# Patient Record
Sex: Male | Born: 1945 | Hispanic: No | Smoking: Never smoker
Health system: Southern US, Community
[De-identification: ages and names within clinical notes are randomized; demographics above are authoritative.]

## PROBLEM LIST (undated history)

## (undated) DIAGNOSIS — I10 Essential (primary) hypertension: Secondary | ICD-10-CM

## (undated) DIAGNOSIS — R531 Weakness: Secondary | ICD-10-CM

## (undated) DIAGNOSIS — A809 Acute poliomyelitis, unspecified: Secondary | ICD-10-CM

## (undated) DIAGNOSIS — Z8546 Personal history of malignant neoplasm of prostate: Secondary | ICD-10-CM

## (undated) DIAGNOSIS — M1990 Unspecified osteoarthritis, unspecified site: Secondary | ICD-10-CM

## (undated) DIAGNOSIS — N492 Inflammatory disorders of scrotum: Secondary | ICD-10-CM

## (undated) DIAGNOSIS — K219 Gastro-esophageal reflux disease without esophagitis: Secondary | ICD-10-CM

## (undated) HISTORY — PX: OTHER SURGICAL HISTORY: SHX169

---
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
## 1997-11-15 ENCOUNTER — Other Ambulatory Visit: Admission: RE | Admit: 1997-11-15 | Discharge: 1997-11-15 | Payer: Self-pay

## 1998-01-05 ENCOUNTER — Encounter: Admission: RE | Admit: 1998-01-05 | Discharge: 1998-01-05 | Payer: Self-pay | Admitting: Internal Medicine

## 1998-02-09 ENCOUNTER — Encounter: Admission: RE | Admit: 1998-02-09 | Discharge: 1998-02-09 | Payer: Self-pay | Admitting: Internal Medicine

## 1998-03-07 ENCOUNTER — Encounter: Admission: RE | Admit: 1998-03-07 | Discharge: 1998-03-07 | Payer: Self-pay | Admitting: Hematology and Oncology

## 1998-03-12 ENCOUNTER — Ambulatory Visit (HOSPITAL_COMMUNITY): Admission: RE | Admit: 1998-03-12 | Discharge: 1998-03-12 | Payer: Self-pay | Admitting: Gastroenterology

## 1998-03-30 ENCOUNTER — Encounter: Admission: RE | Admit: 1998-03-30 | Discharge: 1998-03-30 | Payer: Self-pay | Admitting: Internal Medicine

## 1998-05-03 ENCOUNTER — Encounter: Admission: RE | Admit: 1998-05-03 | Discharge: 1998-05-03 | Payer: Self-pay | Admitting: Internal Medicine

## 1998-06-12 ENCOUNTER — Encounter: Admission: RE | Admit: 1998-06-12 | Discharge: 1998-06-12 | Payer: Self-pay | Admitting: Internal Medicine

## 1998-06-29 ENCOUNTER — Encounter: Admission: RE | Admit: 1998-06-29 | Discharge: 1998-06-29 | Payer: Self-pay | Admitting: Internal Medicine

## 1998-10-12 ENCOUNTER — Encounter: Admission: RE | Admit: 1998-10-12 | Discharge: 1998-10-12 | Payer: Self-pay | Admitting: Internal Medicine

## 1999-01-02 ENCOUNTER — Encounter: Admission: RE | Admit: 1999-01-02 | Discharge: 1999-01-02 | Payer: Self-pay | Admitting: Internal Medicine

## 1999-03-08 ENCOUNTER — Encounter: Admission: RE | Admit: 1999-03-08 | Discharge: 1999-03-08 | Payer: Self-pay | Admitting: Internal Medicine

## 1999-06-06 ENCOUNTER — Encounter: Admission: RE | Admit: 1999-06-06 | Discharge: 1999-06-06 | Payer: Self-pay | Admitting: Internal Medicine

## 1999-06-14 ENCOUNTER — Emergency Department (HOSPITAL_COMMUNITY): Admission: EM | Admit: 1999-06-14 | Discharge: 1999-06-14 | Payer: Self-pay | Admitting: Emergency Medicine

## 1999-06-28 ENCOUNTER — Encounter: Admission: RE | Admit: 1999-06-28 | Discharge: 1999-06-28 | Payer: Self-pay | Admitting: Internal Medicine

## 2001-08-28 ENCOUNTER — Emergency Department (HOSPITAL_COMMUNITY): Admission: EM | Admit: 2001-08-28 | Discharge: 2001-08-28 | Payer: Self-pay | Admitting: Emergency Medicine

## 2002-01-15 ENCOUNTER — Emergency Department (HOSPITAL_COMMUNITY): Admission: EM | Admit: 2002-01-15 | Discharge: 2002-01-15 | Payer: Self-pay | Admitting: Emergency Medicine

## 2002-04-19 ENCOUNTER — Ambulatory Visit (HOSPITAL_COMMUNITY): Admission: RE | Admit: 2002-04-19 | Discharge: 2002-04-19 | Payer: Self-pay | Admitting: Gastroenterology

## 2002-05-17 ENCOUNTER — Emergency Department (HOSPITAL_COMMUNITY): Admission: EM | Admit: 2002-05-17 | Discharge: 2002-05-17 | Payer: Self-pay | Admitting: Emergency Medicine

## 2002-05-17 ENCOUNTER — Encounter: Payer: Self-pay | Admitting: Emergency Medicine

## 2004-03-29 ENCOUNTER — Ambulatory Visit: Payer: Self-pay | Admitting: Internal Medicine

## 2004-04-03 ENCOUNTER — Ambulatory Visit: Payer: Self-pay | Admitting: *Deleted

## 2004-04-03 ENCOUNTER — Ambulatory Visit: Payer: Self-pay | Admitting: Nurse Practitioner

## 2004-04-22 ENCOUNTER — Encounter: Admission: RE | Admit: 2004-04-22 | Discharge: 2004-06-03 | Payer: Self-pay | Admitting: Internal Medicine

## 2004-07-09 ENCOUNTER — Ambulatory Visit: Payer: Self-pay | Admitting: Nurse Practitioner

## 2004-10-09 ENCOUNTER — Ambulatory Visit: Payer: Self-pay | Admitting: Internal Medicine

## 2004-10-31 ENCOUNTER — Ambulatory Visit: Payer: Self-pay | Admitting: Nurse Practitioner

## 2005-01-08 ENCOUNTER — Emergency Department (HOSPITAL_COMMUNITY): Admission: EM | Admit: 2005-01-08 | Discharge: 2005-01-08 | Payer: Self-pay | Admitting: Family Medicine

## 2005-03-27 ENCOUNTER — Ambulatory Visit: Payer: Self-pay | Admitting: Nurse Practitioner

## 2005-08-21 ENCOUNTER — Ambulatory Visit: Payer: Self-pay | Admitting: Nurse Practitioner

## 2006-02-19 ENCOUNTER — Ambulatory Visit: Payer: Self-pay | Admitting: Nurse Practitioner

## 2006-02-25 ENCOUNTER — Ambulatory Visit: Payer: Self-pay | Admitting: Nurse Practitioner

## 2006-09-17 ENCOUNTER — Ambulatory Visit: Payer: Self-pay | Admitting: Nurse Practitioner

## 2007-01-13 ENCOUNTER — Ambulatory Visit: Payer: Self-pay | Admitting: Family Medicine

## 2007-01-19 ENCOUNTER — Ambulatory Visit: Payer: Self-pay | Admitting: Internal Medicine

## 2007-03-11 ENCOUNTER — Ambulatory Visit: Payer: Self-pay | Admitting: Internal Medicine

## 2007-04-13 ENCOUNTER — Ambulatory Visit: Payer: Self-pay | Admitting: Internal Medicine

## 2007-05-27 ENCOUNTER — Ambulatory Visit: Payer: Self-pay | Admitting: Internal Medicine

## 2007-11-30 ENCOUNTER — Ambulatory Visit: Payer: Self-pay | Admitting: Internal Medicine

## 2007-11-30 ENCOUNTER — Encounter (INDEPENDENT_AMBULATORY_CARE_PROVIDER_SITE_OTHER): Payer: Self-pay | Admitting: Nurse Practitioner

## 2007-11-30 LAB — CONVERTED CEMR LAB
ALT: 12 units/L (ref 0–53)
AST: 10 units/L (ref 0–37)
Albumin: 4.3 g/dL (ref 3.5–5.2)
Alkaline Phosphatase: 75 units/L (ref 39–117)
BUN: 17 mg/dL (ref 6–23)
Basophils Absolute: 0.1 10*3/uL (ref 0.0–0.1)
Basophils Relative: 1 % (ref 0–1)
CO2: 21 meq/L (ref 19–32)
Calcium: 9.1 mg/dL (ref 8.4–10.5)
Chloride: 107 meq/L (ref 96–112)
Creatinine, Ser: 0.95 mg/dL (ref 0.40–1.50)
Eosinophils Absolute: 0.2 10*3/uL (ref 0.0–0.7)
Eosinophils Relative: 2 % (ref 0–5)
Glucose, Bld: 126 mg/dL — ABNORMAL HIGH (ref 70–99)
HCT: 40.7 % (ref 39.0–52.0)
Hemoglobin: 13.3 g/dL (ref 13.0–17.0)
Lymphocytes Relative: 20 % (ref 12–46)
Lymphs Abs: 2 10*3/uL (ref 0.7–4.0)
MCHC: 32.7 g/dL (ref 30.0–36.0)
MCV: 87.2 fL (ref 78.0–100.0)
Monocytes Absolute: 0.8 10*3/uL (ref 0.1–1.0)
Monocytes Relative: 8 % (ref 3–12)
Neutro Abs: 7.2 10*3/uL (ref 1.7–7.7)
Neutrophils Relative %: 70 % (ref 43–77)
PSA: 1.49 ng/mL (ref 0.10–4.00)
Platelets: 279 10*3/uL (ref 150–400)
Potassium: 4.3 meq/L (ref 3.5–5.3)
RBC: 4.67 M/uL (ref 4.22–5.81)
RDW: 13.9 % (ref 11.5–15.5)
Sodium: 142 meq/L (ref 135–145)
TSH: 1.697 microintl units/mL (ref 0.350–5.50)
Total Bilirubin: 0.3 mg/dL (ref 0.3–1.2)
Total Protein: 7.3 g/dL (ref 6.0–8.3)
WBC: 10.3 10*3/uL (ref 4.0–10.5)

## 2007-12-22 ENCOUNTER — Ambulatory Visit: Payer: Self-pay | Admitting: Family Medicine

## 2007-12-22 ENCOUNTER — Encounter (INDEPENDENT_AMBULATORY_CARE_PROVIDER_SITE_OTHER): Payer: Self-pay | Admitting: Nurse Practitioner

## 2007-12-22 LAB — CONVERTED CEMR LAB
Cholesterol: 163 mg/dL (ref 0–200)
HDL: 40 mg/dL (ref >39)
LDL Cholesterol: 75 mg/dL (ref 0–99)
Total CHOL/HDL Ratio: 4.1
Triglycerides: 242 mg/dL — ABNORMAL HIGH (ref <150)
VLDL: 48 mg/dL — ABNORMAL HIGH (ref 0–40)

## 2008-07-19 ENCOUNTER — Ambulatory Visit: Payer: Self-pay | Admitting: Internal Medicine

## 2008-08-24 ENCOUNTER — Emergency Department (HOSPITAL_COMMUNITY): Admission: EM | Admit: 2008-08-24 | Discharge: 2008-08-24 | Payer: Self-pay | Admitting: Family Medicine

## 2009-05-18 ENCOUNTER — Emergency Department (HOSPITAL_COMMUNITY): Admission: EM | Admit: 2009-05-18 | Discharge: 2009-05-18 | Payer: Self-pay | Admitting: Emergency Medicine

## 2009-05-19 ENCOUNTER — Emergency Department (HOSPITAL_COMMUNITY): Admission: EM | Admit: 2009-05-19 | Discharge: 2009-05-19 | Payer: Self-pay | Admitting: Family Medicine

## 2010-06-25 ENCOUNTER — Encounter: Admission: RE | Admit: 2010-06-25 | Discharge: 2010-06-25 | Payer: Self-pay | Admitting: Gastroenterology

## 2010-08-02 LAB — HM COLONOSCOPY

## 2010-12-13 NOTE — Op Note (Signed)
Eduardo Moon, Eduardo Moon                           ACCOUNT NO.:  [id number]   MEDICAL RECORD NO.:  [id number]                   PATIENT TYPE:  AMB   LOCATION:  ENDO                                 FACILITY:  [facility]   PHYSICIAN:  Petra Kuba, M.D.                 DATE OF BIRTH:  20-Sep-1945   DATE OF PROCEDURE:  04/19/2002  DATE OF DISCHARGE:                                 OPERATIVE REPORT   PROCEDURE:  Colonoscopy.   INDICATIONS:  Patient with polyp on flexible sigmoidoscopy not found on  previous colonoscopy.  Want to repeat just to make sure.  Consent was signed  after risks, benefits, methods, and options thoroughly discussed in the  office on multiple occasions.   MEDICATIONS:  Demerol 80 mg, Versed 8 mg.   DESCRIPTION OF PROCEDURE:  Rectal inspection was pertinent for external  hemorrhoids, small.  Digital exam was negative.  The video colonoscope was  inserted, easily advanced around the colon to the cecum.  This did require  some left lower quadrant pressure and no position changes.  The cecum was  identified by the appendiceal orifice and the ileocecal valve.  In fact, the  scope was inserted a short way into the terminal ileum, which was normal.  Photo documentation was obtained.  On insertion some left diverticula were  seen only.  The scope was slowly withdrawn.  Prep was adequate.  There was  some liquid stool that required washing and suctioning.  On slow withdrawal  back to the rectum other than the left-sided diverticula, no polypoid  lesions, masses, or other abnormalities were seen as we slowly withdrew back  to the rectum.  Once back in the rectum the scope was then retroflexed,  pertinent for some internal hemorrhoids.  The scope was straightened and  readvanced to about 75 cm and one more time on slow withdrawal, no  additional findings were seen as we withdrew one more time back to the  rectum.  The scope was readvanced a third time, air was suctioned, and the  scope removed.  The patient tolerated the procedure well.  There was no  obvious immediate complication.   ENDOSCOPIC DIAGNOSES:  1. Internal-external hemorrhoids.  2. Left-sided diverticula.  3. Otherwise within normal limits to the terminal ileum.   PLAN:  Happy to see back p.r.n.  Otherwise, return care to [facility] for the customary health care maintenance to include yearly rectals  and guaiacs, and otherwise repeat screening in five to 10 years unless  needed sooner p.r.n.                                               Petra Kuba, M.D.    MEM/MEDQ  D:  04/19/2002  T:  04/20/2002  Job:  [id number]   cc:   [employer]

## 2011-02-26 ENCOUNTER — Ambulatory Visit
Admission: RE | Admit: 2011-02-26 | Discharge: 2011-02-26 | Disposition: A | Discharge status: Home / Self Care | Payer: PRIVATE HEALTH INSURANCE | Source: Ambulatory Visit | Attending: Radiation Oncology | Admitting: Radiation Oncology

## 2011-02-26 DIAGNOSIS — E7800 Pure hypercholesterolemia, unspecified: Secondary | ICD-10-CM | POA: Insufficient documentation

## 2011-02-26 DIAGNOSIS — J45909 Unspecified asthma, uncomplicated: Secondary | ICD-10-CM | POA: Insufficient documentation

## 2011-02-26 DIAGNOSIS — C61 Malignant neoplasm of prostate: Secondary | ICD-10-CM | POA: Insufficient documentation

## 2011-02-26 DIAGNOSIS — I10 Essential (primary) hypertension: Secondary | ICD-10-CM | POA: Insufficient documentation

## 2011-02-26 DIAGNOSIS — K219 Gastro-esophageal reflux disease without esophagitis: Secondary | ICD-10-CM | POA: Insufficient documentation

## 2011-02-26 DIAGNOSIS — Z8612 Personal history of poliomyelitis: Secondary | ICD-10-CM | POA: Insufficient documentation

## 2011-03-19 ENCOUNTER — Ambulatory Visit
Admission: RE | Admit: 2011-03-19 | Discharge: 2011-03-19 | Disposition: A | Discharge status: Home / Self Care | Payer: PRIVATE HEALTH INSURANCE | Source: Ambulatory Visit | Attending: Urology | Admitting: Urology

## 2011-03-19 ENCOUNTER — Other Ambulatory Visit: Payer: Self-pay | Admitting: Urology

## 2011-03-19 DIAGNOSIS — Z01811 Encounter for preprocedural respiratory examination: Secondary | ICD-10-CM

## 2011-03-24 ENCOUNTER — Ambulatory Visit (HOSPITAL_BASED_OUTPATIENT_CLINIC_OR_DEPARTMENT_OTHER)
Admission: RE | Admit: 2011-03-24 | Discharge: 2011-03-24 | Disposition: A | Discharge status: Home / Self Care | Payer: PRIVATE HEALTH INSURANCE | Source: Ambulatory Visit | Attending: Urology | Admitting: Urology

## 2011-03-24 DIAGNOSIS — C61 Malignant neoplasm of prostate: Secondary | ICD-10-CM | POA: Insufficient documentation

## 2011-04-03 ENCOUNTER — Other Ambulatory Visit: Payer: Self-pay | Admitting: Radiation Oncology

## 2011-04-03 DIAGNOSIS — C61 Malignant neoplasm of prostate: Secondary | ICD-10-CM

## 2011-04-07 LAB — BUN: BUN: 20 mg/dL (ref 6–23)

## 2011-04-07 LAB — CREATININE, SERUM: Creatinine, Ser: 1.16 mg/dL (ref 0.50–1.35)

## 2011-04-14 ENCOUNTER — Ambulatory Visit (HOSPITAL_COMMUNITY)
Admission: RE | Admit: 2011-04-14 | Discharge: 2011-04-14 | Disposition: A | Discharge status: Home / Self Care | Payer: PRIVATE HEALTH INSURANCE | Source: Ambulatory Visit | Attending: Radiation Oncology | Admitting: Radiation Oncology

## 2011-04-14 DIAGNOSIS — C61 Malignant neoplasm of prostate: Secondary | ICD-10-CM

## 2011-04-14 LAB — CBC
MCH: 30.8 pg (ref 26.0–34.0)
Platelets: 278 10*3/uL (ref 150–400)
RBC: 4.45 MIL/uL (ref 4.22–5.81)

## 2011-04-14 LAB — COMPREHENSIVE METABOLIC PANEL
ALT: 14 U/L (ref 0–53)
AST: 9 U/L (ref 0–37)
CO2: 24 mEq/L (ref 19–32)
Calcium: 9.4 mg/dL (ref 8.4–10.5)
GFR calc non Af Amer: >60 mL/min (ref >60)
Sodium: 139 mEq/L (ref 135–145)
Total Protein: 6.9 g/dL (ref 6.0–8.3)

## 2011-04-14 LAB — APTT: aPTT: 33 seconds (ref 24–37)

## 2011-04-14 MED ORDER — IOHEXOL 300 MG/ML  SOLN
80.0000 mL | Freq: Once | INTRAMUSCULAR | Status: AC | PRN
  Administered 2011-04-14: 80 mL via INTRAVENOUS

## 2011-04-21 ENCOUNTER — Ambulatory Visit (HOSPITAL_BASED_OUTPATIENT_CLINIC_OR_DEPARTMENT_OTHER)
Admission: RE | Admit: 2011-04-21 | Discharge: 2011-04-21 | Disposition: A | Discharge status: Home / Self Care | Payer: PRIVATE HEALTH INSURANCE | Source: Ambulatory Visit | Attending: Urology | Admitting: Urology

## 2011-04-21 ENCOUNTER — Ambulatory Visit (HOSPITAL_COMMUNITY): Payer: PRIVATE HEALTH INSURANCE | Attending: Urology

## 2011-04-21 DIAGNOSIS — Z01810 Encounter for preprocedural cardiovascular examination: Secondary | ICD-10-CM | POA: Insufficient documentation

## 2011-04-21 DIAGNOSIS — Z01812 Encounter for preprocedural laboratory examination: Secondary | ICD-10-CM | POA: Insufficient documentation

## 2011-04-21 DIAGNOSIS — C61 Malignant neoplasm of prostate: Secondary | ICD-10-CM | POA: Insufficient documentation

## 2011-04-27 NOTE — Op Note (Addendum)
  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  PROCEDURE PERFORMED:  Prostatic radioactive seed implantation via Contractor.  SURGEON:  Valetta Fuller, MD  ASSISTANT:  Maryln Gottron, MD  ANESTHESIA:  General.  INDICATIONS:  Mr. Livsey is 65 years of age.  The patient had a minimally elevated PSA.  The patient also had a small prostatic nodule. At the time of ultrasound and biopsy, the patient was documented to have adenocarcinoma of the prostate.  The small palpable abnormality was on the right side where all biopsies were negative.  On the left 4/6 cores were positive for a mixture of both Gleason 3+3 equals 6 as well as Gleason 3+4 equals 7 prostate cancer.  The patient was felt to have intermediate-risk disease.  The patient had a small prostate without significant voiding complaints.  Multitude options were discussed with them.  He was sent also to Radiation Oncology for assessment of options. He elected to have I-125 prostatic seed implantation.  He appeared to understand the advantages, disadvantages, potential risks.  He presents now for the procedure with a target dose of 145 gray.  TECHNIQUE AND FINDINGS:  The patient received perioperative ciprofloxacin and placement of PAS compression boots.  He was brought to the operating room where he had successful induction of general anesthesia.  He was carefully placed in the mid lithotomy position with compression boots in place.  The initial portion of the  procedure was performed by Radiation Oncology.  Transrectal ultrasound probe was placed and anchored to a floor stand.  Anchoring needles were placed. Foley catheter with contrast and the balloon was inserted.  Real time ultrasound images were taken and contouring performed of the prostate, urethra, and rectum.  Dosing parameters were then established and we were called into the operating room.  A second appropriate surgical time- out was performed.  All needle passage was done with real time ultrasound guidance in the sagittal plane.  A total of 20 needles were inserted.  All seed implantation was done by the [facility] with a total of 70 active seeds.  At the completion of the procedure, fluoroscopic imaging suggested excellent distribution of the seeds. Flexible cystoscopy was performed and there was no evidence of seeds within the prostatic urethra or bladder and a new Foley catheter was placed.  The patient was brought to recovery room in stable condition. He had no obvious complications or problems.     Valetta Fuller, MD     DSG/MEDQ  D:  04/21/2011  T:  04/21/2011  Job:  [id number]  cc:   Aida Puffer, MD Fax: [phone]  Electronically Signed by Barron Alvine M.D. on 05/07/2011 10:39:35 PM

## 2011-05-22 ENCOUNTER — Emergency Department (HOSPITAL_COMMUNITY)
Admission: EM | Admit: 2011-05-22 | Discharge: 2011-05-22 | Disposition: A | Discharge status: Home / Self Care | Payer: No Typology Code available for payment source | Attending: Emergency Medicine | Admitting: Emergency Medicine

## 2011-05-22 ENCOUNTER — Emergency Department (HOSPITAL_COMMUNITY): Payer: No Typology Code available for payment source

## 2011-05-22 DIAGNOSIS — M25519 Pain in unspecified shoulder: Secondary | ICD-10-CM | POA: Insufficient documentation

## 2011-05-22 DIAGNOSIS — Z79899 Other long term (current) drug therapy: Secondary | ICD-10-CM | POA: Insufficient documentation

## 2011-05-22 DIAGNOSIS — M549 Dorsalgia, unspecified: Secondary | ICD-10-CM | POA: Insufficient documentation

## 2011-05-22 DIAGNOSIS — M109 Gout, unspecified: Secondary | ICD-10-CM | POA: Insufficient documentation

## 2011-05-22 DIAGNOSIS — I10 Essential (primary) hypertension: Secondary | ICD-10-CM | POA: Insufficient documentation

## 2011-05-22 DIAGNOSIS — Z8612 Personal history of poliomyelitis: Secondary | ICD-10-CM | POA: Insufficient documentation

## 2011-05-22 DIAGNOSIS — M542 Cervicalgia: Secondary | ICD-10-CM | POA: Insufficient documentation

## 2012-03-29 IMAGING — CT CT CHEST W/ CM
1 of 2 series · 14 of 32 positions shown, 18 images · IV contrast ([ID] OMNI 300)
Comparison: none

CLINICAL DATA: Prostate cancer.  Density overlying the upper
thoracic spine on the lateral chest x-ray from 03/19/2011.

CT CHEST WITH CONTRAST
TECHNIQUE: Multidetector CT imaging of the chest was performed
following the standard protocol during bolus administration of
intravenous contrast.
Contrast: 80mL OMNIPAQUE IOHEXOL 300 MG/ML IV SOLN
ray exam from 03/19/2011

[Series 2: rtn chest with st · axial · 0.77mm/px · z∈[+1732,+1957]mm · 14 of 55 slices shown, 18 images]
[im 5/55  mediastinal]
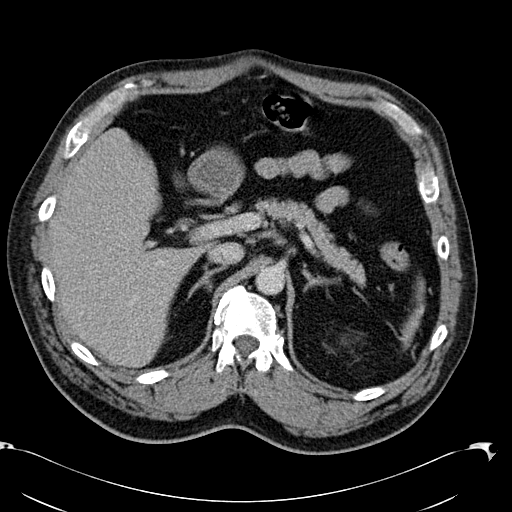
[im 5/55  lung]
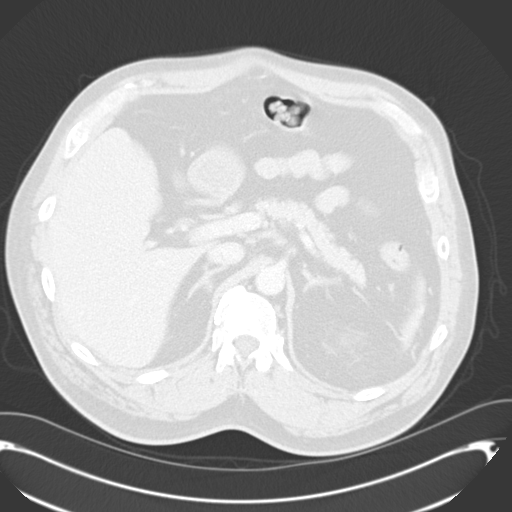
[im 9/55  lung]
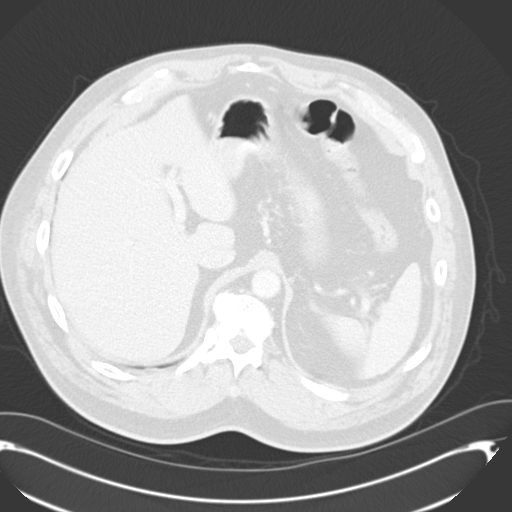
[im 13/55  lung]
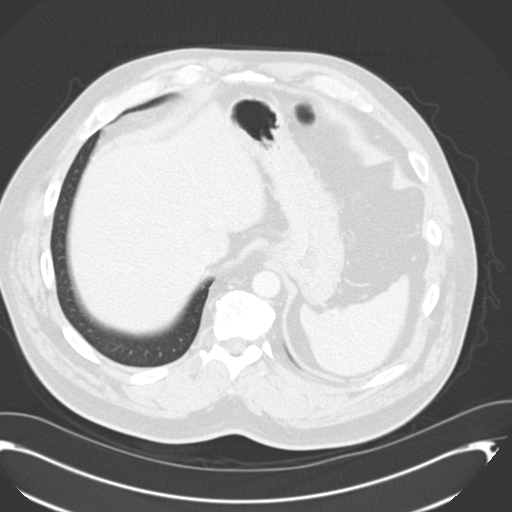
[im 17/55  lung]
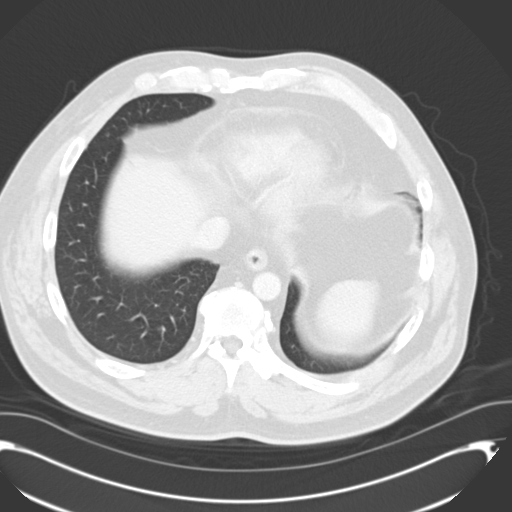
[im 21/55  mediastinal]
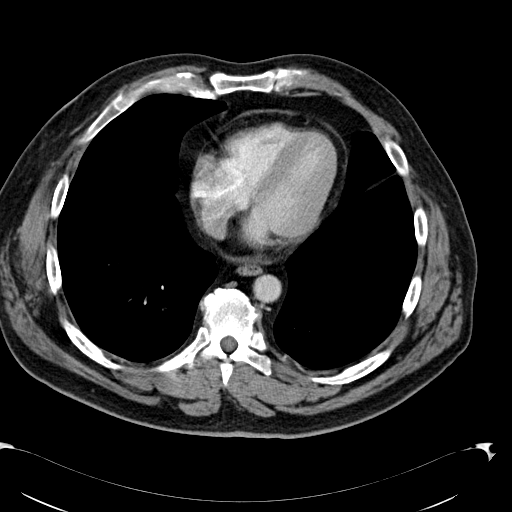
[im 21/55  lung]
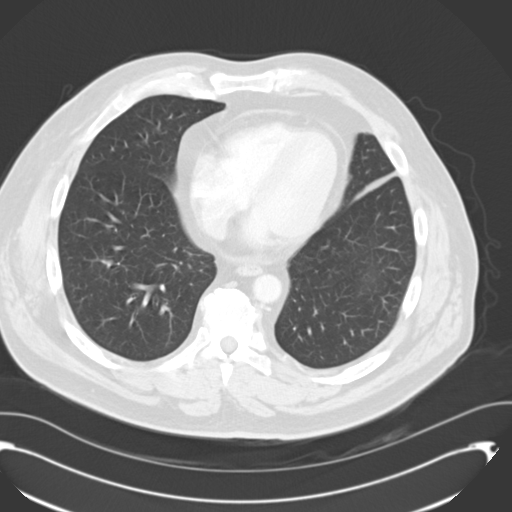
[im 25/55  lung]
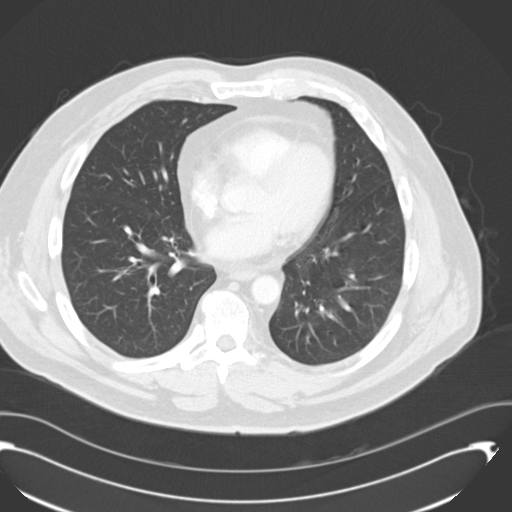
[im 26/55  lung]
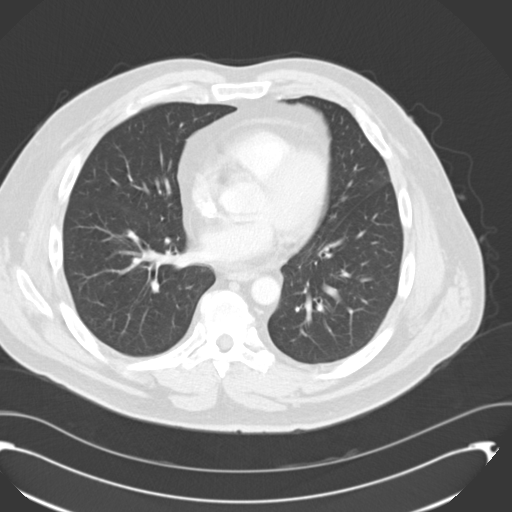
[im 28/55  lung]
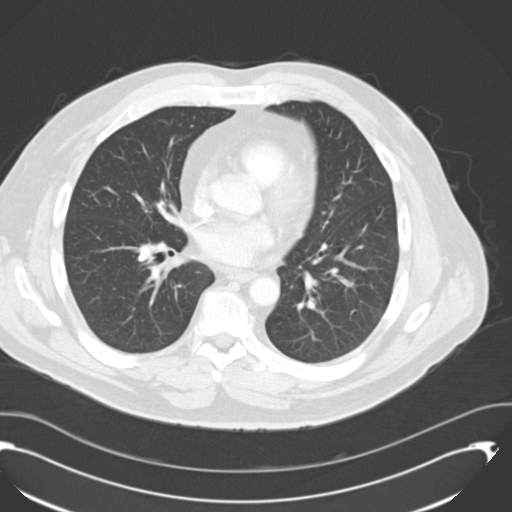
[im 30/55  mediastinal]
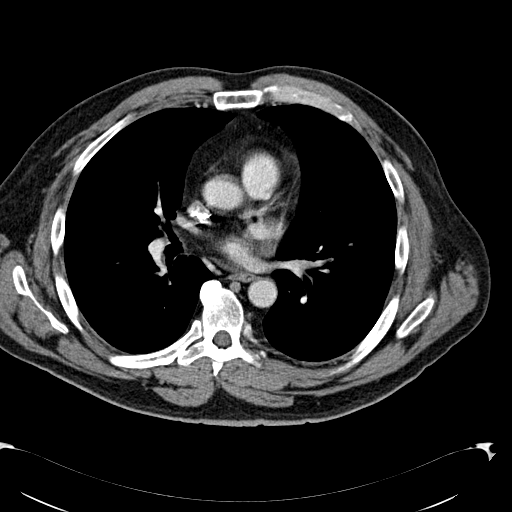
[im 30/55  lung]
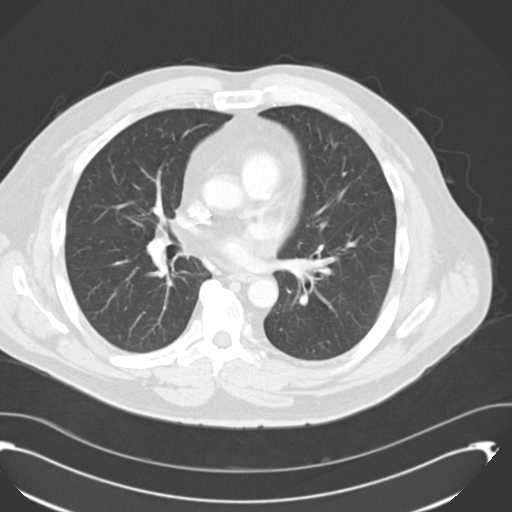
[im 34/55  lung]
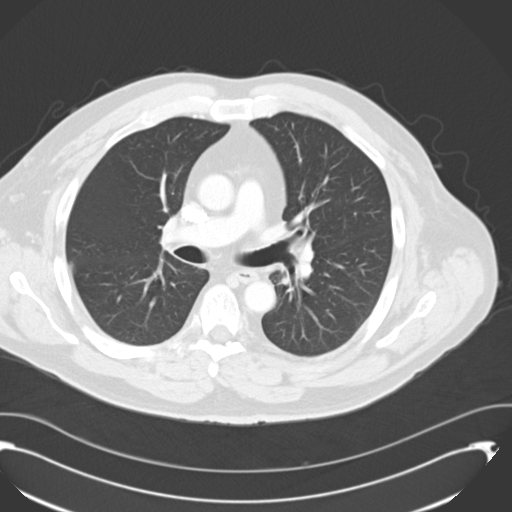
[im 38/55  lung]
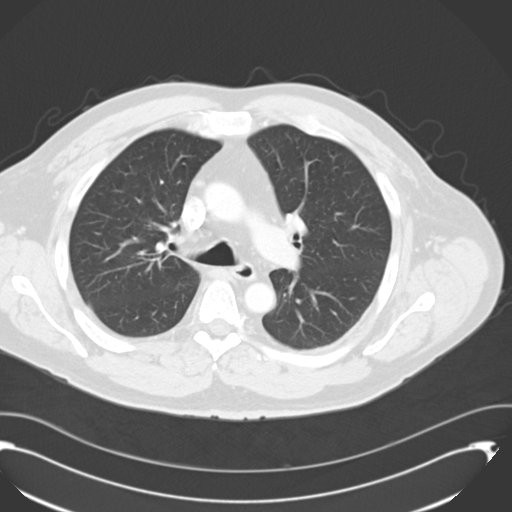
[im 42/55  lung]
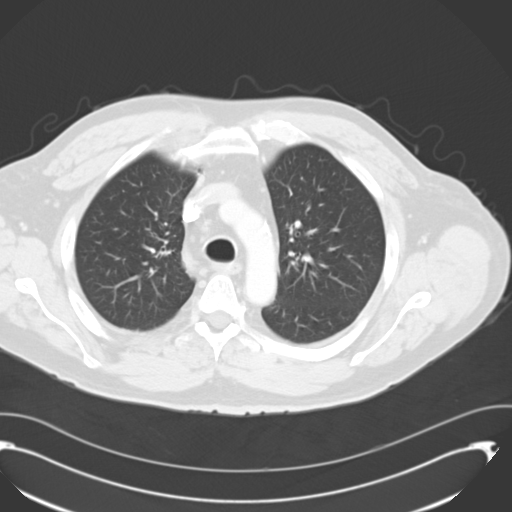
[im 46/55  mediastinal]
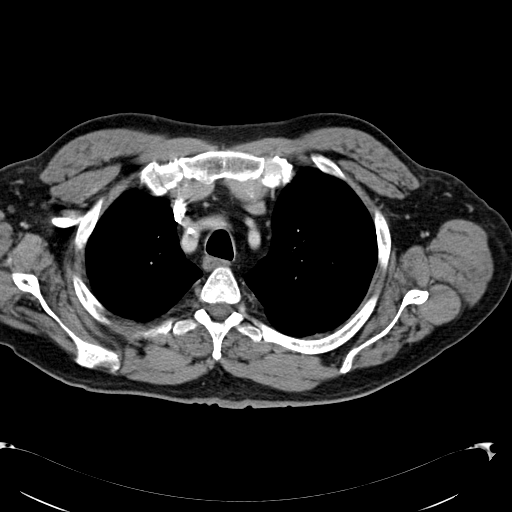
[im 46/55  lung]
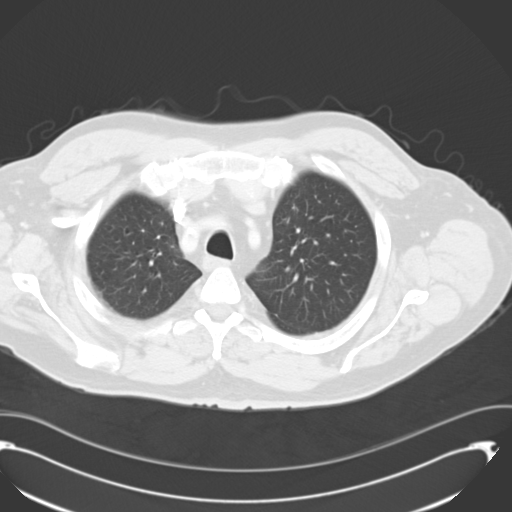
[im 50/55  lung]
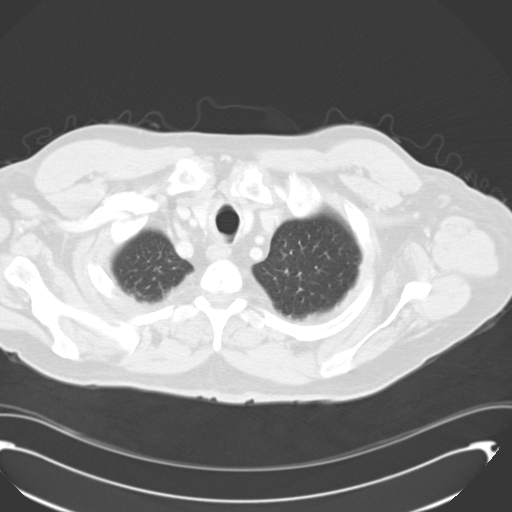

[14 of 32 positions shown; findings below may reference images not displayed]

FINDINGS: There is no axillary, mediastinal, or hilar
lymphadenopathy.  Heart size is normal.  There is no pericardial or
pleural effusion.

The lungs are clear bilaterally.

There is prominent paraspinal osteophytes in the thoracic spine,
but no sclerotic lesion within the thoracic vertebral bodies.  No
evidence for sclerotic rib lesion.
IMPRESSION: No evidence for a lung or spinal lesion to account for the opacity
seen on the recent chest x-ray.  As such, the findings on the chest
film are most consistent with superimposition of structures.

## 2012-04-16 ENCOUNTER — Emergency Department (HOSPITAL_COMMUNITY): Payer: PRIVATE HEALTH INSURANCE

## 2012-04-16 ENCOUNTER — Encounter (HOSPITAL_COMMUNITY): Payer: Self-pay | Admitting: Emergency Medicine

## 2012-04-16 ENCOUNTER — Emergency Department (HOSPITAL_COMMUNITY)
Admission: EM | Admit: 2012-04-16 | Discharge: 2012-04-16 | Disposition: A | Discharge status: Home / Self Care | Payer: PRIVATE HEALTH INSURANCE | Attending: Emergency Medicine | Admitting: Emergency Medicine

## 2012-04-16 DIAGNOSIS — R8290 Unspecified abnormal findings in urine: Secondary | ICD-10-CM

## 2012-04-16 DIAGNOSIS — I10 Essential (primary) hypertension: Secondary | ICD-10-CM | POA: Insufficient documentation

## 2012-04-16 DIAGNOSIS — D72829 Elevated white blood cell count, unspecified: Secondary | ICD-10-CM | POA: Insufficient documentation

## 2012-04-16 DIAGNOSIS — R82998 Other abnormal findings in urine: Secondary | ICD-10-CM | POA: Insufficient documentation

## 2012-04-16 DIAGNOSIS — N453 Epididymo-orchitis: Secondary | ICD-10-CM | POA: Insufficient documentation

## 2012-04-16 DIAGNOSIS — E876 Hypokalemia: Secondary | ICD-10-CM

## 2012-04-16 HISTORY — DX: Acute poliomyelitis, unspecified: A80.9

## 2012-04-16 HISTORY — DX: Unspecified osteoarthritis, unspecified site: M19.90

## 2012-04-16 HISTORY — DX: Essential (primary) hypertension: I10

## 2012-04-16 LAB — URINE MICROSCOPIC-ADD ON

## 2012-04-16 LAB — URINALYSIS, ROUTINE W REFLEX MICROSCOPIC
Glucose, UA: NEGATIVE mg/dL
pH: 5.5 (ref 5.0–8.0)

## 2012-04-16 LAB — CBC WITH DIFFERENTIAL/PLATELET
Eosinophils Relative: 1 % (ref 0–5)
HCT: 31.3 % — ABNORMAL LOW (ref 39.0–52.0)
Hemoglobin: 10.8 g/dL — ABNORMAL LOW (ref 13.0–17.0)
Lymphocytes Relative: 7 % — ABNORMAL LOW (ref 12–46)
Lymphs Abs: 1.1 10*3/uL (ref 0.7–4.0)
MCV: 86 fL (ref 78.0–100.0)
Monocytes Absolute: 1.4 10*3/uL — ABNORMAL HIGH (ref 0.1–1.0)
RBC: 3.64 MIL/uL — ABNORMAL LOW (ref 4.22–5.81)
WBC: 17.3 10*3/uL — ABNORMAL HIGH (ref 4.0–10.5)

## 2012-04-16 LAB — BASIC METABOLIC PANEL
CO2: 22 mEq/L (ref 19–32)
Chloride: 99 mEq/L (ref 96–112)
Sodium: 137 mEq/L (ref 135–145)

## 2012-04-16 MED ORDER — POTASSIUM CHLORIDE CRYS ER 20 MEQ PO TBCR: 40.0000 meq | EXTENDED_RELEASE_TABLET | Freq: Once | ORAL | Status: DC

## 2012-04-16 MED ORDER — POTASSIUM CHLORIDE CRYS ER 20 MEQ PO TBCR: 20.0000 meq | EXTENDED_RELEASE_TABLET | Freq: Every day | ORAL | Status: DC

## 2012-04-16 MED ORDER — CIPROFLOXACIN HCL 500 MG PO TABS
500.0000 mg | ORAL_TABLET | Freq: Once | ORAL | Status: AC
  Administered 2012-04-16: 500 mg via ORAL
  Filled 2012-04-16: qty 1

## 2012-04-16 MED ORDER — SULFAMETHOXAZOLE-TRIMETHOPRIM 800-160 MG PO TABS: 1.0000 | ORAL_TABLET | Freq: Two times a day (BID) | ORAL | Status: DC

## 2012-04-16 MED ORDER — POTASSIUM CHLORIDE 20 MEQ/15ML (10%) PO LIQD
40.0000 meq | Freq: Once | ORAL | Status: AC
  Administered 2012-04-16: 40 meq via ORAL
  Filled 2012-04-16: qty 30

## 2012-04-16 NOTE — ED Notes (Signed)
1st attempt to obtain labs pt having ultruasound completed

## 2012-04-16 NOTE — ED Notes (Signed)
Family at bedside. 

## 2012-04-16 NOTE — ED Notes (Signed)
Reports has experienced bilateral swelling to scrotum with dysuria. Denies nausea, vomiting or fever. 2 days ago pain 10/10 now 3/10.

## 2012-04-16 NOTE — ED Notes (Signed)
Pt presenting to ed with c/o pain and swelling in his groin and dysuria pt states onset x 1 week pt states he has history of prostate ca. Pt denies fever, nausea and vomiting at this time

## 2012-04-16 NOTE — ED Provider Notes (Addendum)
History     CSN: [id number]  Arrival date & time 04/16/12  1803   First MD Initiated Contact with Patient 04/16/12 1851      Chief Complaint  Patient presents with  . Groin Pain    (Consider location/radiation/quality/duration/timing/severity/associated sxs/prior treatment) Patient is a 66 y.o. male presenting with groin pain. The history is provided by the patient.  Groin Pain Pertinent negatives include no abdominal pain and no headaches.   66 year old, male, with a history of prostate cancer, and post polio syndrome, presents to emergency department complaining of swelling of his right testicle.  He states that he had been painful for past week, but now.  The pain has resolved.  He denies trauma.  He denies nausea, vomiting, fevers, chills.  He denies hematuria or dysuria.  He has not had night sweats, or weight loss.  Past Medical History  Diagnosis Date  . Cancer   . Hypertension   . Arthritis   . Polio     Past Surgical History  Procedure Date  . Prostate surgery     No family history on file.  History  Substance Use Topics  . Smoking status: Never Smoker   . Smokeless tobacco: Not on file  . Alcohol Use: No      Review of Systems  Constitutional: Negative for fever, chills, diaphoresis and unexpected weight change.  Gastrointestinal: Negative for abdominal pain.  Genitourinary: Positive for testicular pain. Negative for dysuria, hematuria, penile swelling and penile pain.       Right testicular swelling.  Pain, has resolved  Musculoskeletal: Negative for back pain.  Skin: Negative for rash.  Neurological: Negative for weakness and headaches.  Hematological: Does not bruise/bleed easily.  All other systems reviewed and are negative.    Allergies  Review of patient's allergies indicates no known allergies.  Home Medications   Current Outpatient Rx  Name Route Sig Dispense Refill  . HYDROCHLOROTHIAZIDE 25 MG PO TABS Oral Take 25 mg by mouth daily.     Marland Kitchen HYDROCODONE-ACETAMINOPHEN 7.5-500 MG PO TABS Oral Take 1 tablet by mouth every 6 (six) hours as needed. Pain    . IBUPROFEN 200 MG PO TABS Oral Take 200 mg by mouth every 6 (six) hours as needed. Pain    . LISINOPRIL 20 MG PO TABS Oral Take 20 mg by mouth daily.      BP 124/61  Pulse 67  Temp 98.2 F (36.8 C) (Oral)  Resp 20  SpO2 97%  Physical Exam  Nursing note and vitals reviewed. Constitutional: He is oriented to person, place, and time. He appears well-developed and well-nourished. No distress.  HENT:  Head: Normocephalic and atraumatic.  Eyes: Conjunctivae normal are normal.  Neck: Normal range of motion. Neck supple.  Cardiovascular: Normal rate and intact distal pulses.   No murmur heard. Pulmonary/Chest: Effort normal and breath sounds normal.  Abdominal: Soft. Bowel sounds are normal. He exhibits no distension. There is no tenderness.  Genitourinary: Penis normal. No penile tenderness.       Right testicle is approximately the size of a small orange, is nontender.  Slightly spongy feeling the right hemiscrotum is purple  Neurological: He is alert and oriented to person, place, and time.  Skin: Skin is warm and dry.       Right hemiscrotum is purple  Psychiatric: He has a normal mood and affect. Thought content normal.    ED Course  Procedures (including critical care time) right testicular swelling and induration for approximately  a week.  No systemic symptoms.  Presently, no pain.  We will check urine, and blood and perform an ultrasound for further evaluation   Labs Reviewed  BASIC METABOLIC PANEL  CBC WITH DIFFERENTIAL  URINALYSIS, ROUTINE W REFLEX MICROSCOPIC   No results found.   No diagnosis found.     MDM  Testicular swelling epididymoorchitis Leukocytosis No fever. No toxicity. No pain presently.  No extension into perineum eg no signs fornier's gangrene        Cheri Guppy, MD 04/16/12 1944  Cheri Guppy, MD 04/16/12  2122

## 2012-04-16 NOTE — ED Notes (Signed)
U/s tech at bedside, urine sample collected. Family outside room.

## 2012-05-06 ENCOUNTER — Other Ambulatory Visit: Payer: Self-pay | Admitting: Urology

## 2012-05-06 ENCOUNTER — Encounter (HOSPITAL_BASED_OUTPATIENT_CLINIC_OR_DEPARTMENT_OTHER): Payer: Self-pay | Admitting: *Deleted

## 2012-05-06 NOTE — Progress Notes (Signed)
NPO AFTER MN. ARRIVES AT 1030. NEEDS ISTAT AND EKG. 

## 2012-05-07 ENCOUNTER — Encounter (HOSPITAL_BASED_OUTPATIENT_CLINIC_OR_DEPARTMENT_OTHER): Payer: Self-pay | Admitting: Certified Registered"

## 2012-05-07 ENCOUNTER — Ambulatory Visit (HOSPITAL_BASED_OUTPATIENT_CLINIC_OR_DEPARTMENT_OTHER): Payer: PRIVATE HEALTH INSURANCE | Admitting: Certified Registered"

## 2012-05-07 ENCOUNTER — Encounter (HOSPITAL_COMMUNITY)
Admission: RE | Disposition: A | Discharge status: Home / Self Care | Payer: Self-pay | Source: Ambulatory Visit | Attending: Urology

## 2012-05-07 ENCOUNTER — Encounter (HOSPITAL_BASED_OUTPATIENT_CLINIC_OR_DEPARTMENT_OTHER): Payer: Self-pay | Admitting: *Deleted

## 2012-05-07 ENCOUNTER — Other Ambulatory Visit: Payer: Self-pay

## 2012-05-07 ENCOUNTER — Observation Stay (HOSPITAL_BASED_OUTPATIENT_CLINIC_OR_DEPARTMENT_OTHER)
Admission: RE | Admit: 2012-05-07 | Discharge: 2012-05-08 | Disposition: A | Discharge status: Home / Self Care | Payer: PRIVATE HEALTH INSURANCE | Source: Ambulatory Visit | Attending: Urology | Admitting: Urology

## 2012-05-07 DIAGNOSIS — Z79899 Other long term (current) drug therapy: Secondary | ICD-10-CM | POA: Insufficient documentation

## 2012-05-07 DIAGNOSIS — K2100 Gastro-esophageal reflux disease with esophagitis, without bleeding: Secondary | ICD-10-CM | POA: Insufficient documentation

## 2012-05-07 DIAGNOSIS — N453 Epididymo-orchitis: Principal | ICD-10-CM | POA: Insufficient documentation

## 2012-05-07 DIAGNOSIS — N498 Inflammatory disorders of other specified male genital organs: Secondary | ICD-10-CM | POA: Insufficient documentation

## 2012-05-07 DIAGNOSIS — I10 Essential (primary) hypertension: Secondary | ICD-10-CM | POA: Insufficient documentation

## 2012-05-07 DIAGNOSIS — N492 Inflammatory disorders of scrotum: Secondary | ICD-10-CM

## 2012-05-07 DIAGNOSIS — E7800 Pure hypercholesterolemia, unspecified: Secondary | ICD-10-CM | POA: Insufficient documentation

## 2012-05-07 DIAGNOSIS — J45909 Unspecified asthma, uncomplicated: Secondary | ICD-10-CM | POA: Insufficient documentation

## 2012-05-07 HISTORY — DX: Personal history of malignant neoplasm of prostate: Z85.46

## 2012-05-07 HISTORY — DX: Inflammatory disorders of scrotum: N49.2

## 2012-05-07 HISTORY — PX: ORCHIECTOMY: SHX2116

## 2012-05-07 HISTORY — PX: SCROTAL EXPLORATION: SHX2386

## 2012-05-07 HISTORY — DX: Gastro-esophageal reflux disease without esophagitis: K21.9

## 2012-05-07 HISTORY — DX: Weakness: R53.1

## 2012-05-07 LAB — POCT I-STAT, CHEM 8
BUN: 17 mg/dL (ref 6–23)
Chloride: 105 mEq/L (ref 96–112)
Creatinine, Ser: 1.4 mg/dL — ABNORMAL HIGH (ref 0.50–1.35)
Glucose, Bld: 139 mg/dL — ABNORMAL HIGH (ref 70–99)
Hemoglobin: 11.2 g/dL — ABNORMAL LOW (ref 13.0–17.0)
Potassium: 4 mEq/L (ref 3.5–5.1)
Sodium: 136 mEq/L (ref 135–145)

## 2012-05-07 SURGERY — EXPLORATION, SCROTUM
Anesthesia: General | Site: Scrotum | Laterality: Right | Wound class: Dirty or Infected

## 2012-05-07 MED ORDER — MIDAZOLAM HCL 5 MG/5ML IJ SOLN
INTRAMUSCULAR | Status: DC | PRN
  Administered 2012-05-07: 2 mg via INTRAVENOUS

## 2012-05-07 MED ORDER — HYDROCODONE-ACETAMINOPHEN 7.5-325 MG PO TABS
1.0000 | ORAL_TABLET | Freq: Four times a day (QID) | ORAL | Status: DC | PRN
  Administered 2012-05-07: 1 via ORAL

## 2012-05-07 MED ORDER — HYDROCHLOROTHIAZIDE 25 MG PO TABS
25.0000 mg | ORAL_TABLET | Freq: Every day | ORAL | Status: DC
  Administered 2012-05-07 – 2012-05-08 (×2): 25 mg via ORAL
  Filled 2012-05-07 (×2): qty 1

## 2012-05-07 MED ORDER — ONDANSETRON HCL 4 MG/2ML IJ SOLN: 4.0000 mg | INTRAMUSCULAR | Status: DC | PRN

## 2012-05-07 MED ORDER — LISINOPRIL 20 MG PO TABS: 20.0000 mg | ORAL_TABLET | Freq: Every morning | ORAL | Status: DC

## 2012-05-07 MED ORDER — HYDROMORPHONE HCL PF 1 MG/ML IJ SOLN: 0.5000 mg | INTRAMUSCULAR | Status: DC | PRN

## 2012-05-07 MED ORDER — SODIUM CHLORIDE 0.9 % IR SOLN
Status: DC | PRN
  Administered 2012-05-07: 13:00:00

## 2012-05-07 MED ORDER — KCL IN DEXTROSE-NACL 20-5-0.45 MEQ/L-%-% IV SOLN
INTRAVENOUS | Status: DC
  Administered 2012-05-07 – 2012-05-08 (×2): via INTRAVENOUS
  Filled 2012-05-07 (×3): qty 1000

## 2012-05-07 MED ORDER — CEFAZOLIN SODIUM-DEXTROSE 2-3 GM-% IV SOLR
2.0000 g | INTRAVENOUS | Status: AC
  Administered 2012-05-07: 2 g via INTRAVENOUS

## 2012-05-07 MED ORDER — DOXYCYCLINE HYCLATE 50 MG PO CAPS: 100.0000 mg | ORAL_CAPSULE | Freq: Two times a day (BID) | ORAL | Status: DC

## 2012-05-07 MED ORDER — HYDROCODONE-ACETAMINOPHEN 7.5-325 MG PO TABS
1.0000 | ORAL_TABLET | Freq: Four times a day (QID) | ORAL | Status: DC | PRN
  Administered 2012-05-07 – 2012-05-08 (×3): 1 via ORAL
  Filled 2012-05-07 (×3): qty 1

## 2012-05-07 MED ORDER — HYDROMORPHONE HCL PF 1 MG/ML IJ SOLN: 0.2500 mg | INTRAMUSCULAR | Status: DC | PRN

## 2012-05-07 MED ORDER — DIAZEPAM 5 MG PO TABS
5.0000 mg | ORAL_TABLET | Freq: Every day | ORAL | Status: DC | PRN
  Administered 2012-05-07: 5 mg via ORAL
  Filled 2012-05-07: qty 1

## 2012-05-07 MED ORDER — LACTATED RINGERS IV SOLN: INTRAVENOUS | Status: DC

## 2012-05-07 MED ORDER — PROMETHAZINE HCL 25 MG/ML IJ SOLN: 6.2500 mg | INTRAMUSCULAR | Status: DC | PRN

## 2012-05-07 MED ORDER — PROPOFOL 10 MG/ML IV BOLUS
INTRAVENOUS | Status: DC | PRN
  Administered 2012-05-07: 150 mg via INTRAVENOUS

## 2012-05-07 MED ORDER — LIDOCAINE HCL (CARDIAC) 20 MG/ML IV SOLN
INTRAVENOUS | Status: DC | PRN
  Administered 2012-05-07: 50 mg via INTRAVENOUS

## 2012-05-07 MED ORDER — LISINOPRIL 20 MG PO TABS
20.0000 mg | ORAL_TABLET | Freq: Every day | ORAL | Status: DC
  Administered 2012-05-07 – 2012-05-08 (×2): 20 mg via ORAL
  Filled 2012-05-07 (×2): qty 1

## 2012-05-07 MED ORDER — ZOLPIDEM TARTRATE 10 MG PO TABS
10.0000 mg | ORAL_TABLET | Freq: Every evening | ORAL | Status: DC | PRN
  Administered 2012-05-07: 10 mg via ORAL
  Filled 2012-05-07: qty 1

## 2012-05-07 MED ORDER — BUPIVACAINE HCL (PF) 0.25 % IJ SOLN
INTRAMUSCULAR | Status: DC | PRN
  Administered 2012-05-07: 10 mL

## 2012-05-07 MED ORDER — POTASSIUM CHLORIDE CRYS ER 20 MEQ PO TBCR
20.0000 meq | EXTENDED_RELEASE_TABLET | Freq: Every day | ORAL | Status: DC
  Administered 2012-05-07 – 2012-05-08 (×2): 20 meq via ORAL
  Filled 2012-05-07 (×2): qty 1

## 2012-05-07 MED ORDER — DEXTROSE 5 % IV SOLN
1.0000 g | INTRAVENOUS | Status: DC
  Administered 2012-05-07: 1 g via INTRAVENOUS
  Filled 2012-05-07 (×2): qty 10

## 2012-05-07 MED ORDER — LACTATED RINGERS IV SOLN
INTRAVENOUS | Status: DC
  Administered 2012-05-07: 12:00:00 via INTRAVENOUS
  Administered 2012-05-07: 100 mL/h via INTRAVENOUS

## 2012-05-07 MED ORDER — DEXAMETHASONE SODIUM PHOSPHATE 4 MG/ML IJ SOLN
INTRAMUSCULAR | Status: DC | PRN
  Administered 2012-05-07: 8 mg via INTRAVENOUS

## 2012-05-07 MED ORDER — FENTANYL CITRATE 0.05 MG/ML IJ SOLN
INTRAMUSCULAR | Status: DC | PRN
  Administered 2012-05-07: 25 ug via INTRAVENOUS
  Administered 2012-05-07: 50 ug via INTRAVENOUS
  Administered 2012-05-07 (×2): 25 ug via INTRAVENOUS
  Administered 2012-05-07: 50 ug via INTRAVENOUS
  Administered 2012-05-07: 25 ug via INTRAVENOUS

## 2012-05-07 SURGICAL SUPPLY — 61 items
APL SKNCLS STERI-STRIP NONHPOA (GAUZE/BANDAGES/DRESSINGS)
APPLICATOR COTTON TIP 6IN STRL (MISCELLANEOUS) ×2 IMPLANT
BANDAGE GAUZE ELAST BULKY 4 IN (GAUZE/BANDAGES/DRESSINGS) ×2 IMPLANT
BENZOIN TINCTURE PRP APPL 2/3 (GAUZE/BANDAGES/DRESSINGS) IMPLANT
BLADE SURG 10 STRL SS (BLADE) IMPLANT
BLADE SURG 15 STRL LF DISP TIS (BLADE) ×1 IMPLANT
BLADE SURG 15 STRL SS (BLADE) ×2
BLADE SURG ROTATE 9660 (MISCELLANEOUS) ×2 IMPLANT
CANISTER SUCTION 1200CC (MISCELLANEOUS) IMPLANT
CANISTER SUCTION 2500CC (MISCELLANEOUS) IMPLANT
CLEANER CAUTERY TIP 5X5 PAD (MISCELLANEOUS) ×1 IMPLANT
CLOTH BEACON ORANGE TIMEOUT ST (SAFETY) ×2 IMPLANT
COVER MAYO STAND STRL (DRAPES) ×2 IMPLANT
COVER TABLE BACK 60X90 (DRAPES) ×2 IMPLANT
DISSECTOR ROUND CHERRY 3/8 STR (MISCELLANEOUS) ×1 IMPLANT
DRAIN PENROSE 18X1/4 LTX STRL (WOUND CARE) IMPLANT
DRAPE LAPAROTOMY TRNSV 102X78 (DRAPE) IMPLANT
DRAPE PED LAPAROTOMY (DRAPES) ×2 IMPLANT
DRSG PAD ABDOMINAL 8X10 ST (GAUZE/BANDAGES/DRESSINGS) ×1 IMPLANT
DRSG TEGADERM 4X4.75 (GAUZE/BANDAGES/DRESSINGS) IMPLANT
ELECT NDL TIP 2.8 STRL (NEEDLE) ×1 IMPLANT
ELECT NEEDLE TIP 2.8 STRL (NEEDLE) ×2 IMPLANT
ELECT REM PT RETURN 9FT ADLT (ELECTROSURGICAL) ×2
ELECTRODE REM PT RTRN 9FT ADLT (ELECTROSURGICAL) ×1 IMPLANT
GLOVE BIO SURGEON STRL SZ7.5 (GLOVE) ×2 IMPLANT
GLOVE INDICATOR 6.5 STRL GRN (GLOVE) ×2 IMPLANT
GOWN PREVENTION PLUS LG XLONG (DISPOSABLE) ×2 IMPLANT
LOOP VESSEL MAXI BLUE (MISCELLANEOUS) IMPLANT
NDL HYPO 25X1 1.5 SAFETY (NEEDLE) ×1 IMPLANT
NEEDLE HYPO 22GX1.5 SAFETY (NEEDLE) ×2 IMPLANT
NEEDLE HYPO 25X1 1.5 SAFETY (NEEDLE) ×2 IMPLANT
NS IRRIG 500ML POUR BTL (IV SOLUTION) IMPLANT
PACK BASIN DAY SURGERY FS (CUSTOM PROCEDURE TRAY) ×2 IMPLANT
PAD CLEANER CAUTERY TIP 5X5 (MISCELLANEOUS) ×1
PENCIL BUTTON HOLSTER BLD 10FT (ELECTRODE) ×2 IMPLANT
SPONGE LAP 18X18 X RAY DECT (DISPOSABLE) ×1 IMPLANT
STRIP CLOSURE SKIN 1/2X4 (GAUZE/BANDAGES/DRESSINGS) IMPLANT
STRIP CLOSURE SKIN 1/4X4 (GAUZE/BANDAGES/DRESSINGS) IMPLANT
SUPPORT SCROTAL LG STRP (MISCELLANEOUS) ×2 IMPLANT
SUT SILK 0 SH 30 (SUTURE) ×5 IMPLANT
SUT SILK 0 TIES 10X30 (SUTURE) ×2 IMPLANT
SUT VIC AB 2-0 CT1 27 (SUTURE)
SUT VIC AB 2-0 CT1 TAPERPNT 27 (SUTURE) ×1 IMPLANT
SUT VIC AB 3-0 CT1 36 (SUTURE) ×1 IMPLANT
SUT VIC AB 3-0 SH 27 (SUTURE)
SUT VIC AB 3-0 SH 27X BRD (SUTURE) ×2 IMPLANT
SUT VIC AB 4-0 P-3 18XBRD (SUTURE) IMPLANT
SUT VIC AB 4-0 P3 18 (SUTURE)
SUT VIC AB 4-0 RB1 18 (SUTURE) IMPLANT
SUT VIC AB 5-0 P-3 18X BRD (SUTURE) IMPLANT
SUT VIC AB 5-0 P3 18 (SUTURE)
SUT VICRYL 4-0 PS2 18IN ABS (SUTURE) ×1 IMPLANT
SWAB CULTURE LIQ STUART DBL (MISCELLANEOUS) ×1 IMPLANT
SYR BULB IRRIGATION 50ML (SYRINGE) ×1 IMPLANT
SYR CONTROL 10ML LL (SYRINGE) ×2 IMPLANT
TOWEL OR 17X24 6PK STRL BLUE (TOWEL DISPOSABLE) ×4 IMPLANT
TRAY DSU PREP LF (CUSTOM PROCEDURE TRAY) ×2 IMPLANT
TUBE ANAEROBIC SPECIMEN COL (MISCELLANEOUS) ×1 IMPLANT
TUBE CONNECTING 12X1/4 (SUCTIONS) ×1 IMPLANT
WATER STERILE IRR 500ML POUR (IV SOLUTION) IMPLANT
YANKAUER SUCT BULB TIP NO VENT (SUCTIONS) ×1 IMPLANT

## 2012-05-07 NOTE — H&P (Signed)
Reason For Visit               Mr. Mcadory presents today with some ongoing complaints of significant right-sided scrotal swelling. He has not had too much in the way of pain and has had no fever or chills. Again, he was seen in the emergency and felt to have probable epididymal orchitis. He was subsequently seen in our office approximately 10 days ago and given an appointment for follow-up. He remains on antibiotic therapy at this time.       History of Present Illness         66 YO male patient of Dr. Ellin Goodie seen today for an ER follow-up.   GU HX:   Prostate nodule and a mildly elevated PSA of 4.8.  On clinical exam the patient was felt to have a nodule really involving more of the right side of his prostate.  At the time of ultrasound his prostate was small at less than 20 grams.  Interestingly, on the right side biopsies were negative, but the patient did have 4/6 cores positive on the left for prostate cancer.  This was a mixture of Gleason 6 and 7 disease, all with primary Gleason 3 component.  Core involvement was between 5% and 60%.  He has intermediate risk clinical stage T1c adenocarcinoma of the prostate.  The patient has little in the way of voiding symptoms with an AUA symptom score of 3, composed primarily of nocturia.  Preoperative SHIM score is 6.  Status post I-125 seen implant 03/2011.  D90 was 120%. PSA in 1/ 2013 was down to 0.60.  PSA July 2013 was 0.38.  Interval HX:  Was seen in the ER 04/16/12 for an ER follow-up. Had right testicluar pain/swelling. [place]: orchitis with epididymitis. Today states that both pain and swelling has improved. Has not completed SMZ-TMP DS 1po BID X 14 days. No fever/chills or drainage.   Past Medical History Problems  1. History of  Anxiety (Symptom) 300.00 2. History of  Arthritis V13.4 3. History of  Asthma 493.90 4. History of  Chronic Reflux Esophagitis 530.11 5. History of  Depression 311 6. History of  Gout 274.9 7. History  of  Hypercholesterolemia 272.0 8. History of  Hypertension 401.9  Surgical History Problems  1. History of  Surgery Prostate Transperineal Placement Of Needles  Current Meds 1. Diazepam 5 MG Oral Tablet; 1 per day; Therapy: (Recorded:11May2012) to 2. Hydrochlorothiazide 25 MG Oral Tablet; Therapy: 08Nov2012 to 3. Hydrocodone-Acetaminophen 7.5-500 MG Oral Tablet; Therapy: 27Sep2012 to 4. Indocin 50 MG CAPS; 1 three x daily as needed; Therapy: (Recorded:11May2012) to 5. Indomethacin 25 MG Oral Capsule; Therapy: 23May2013 to 6. Naproxen 375 MG Oral Tablet; Therapy: 19Jun2013 to 7. Pantoprazole Sodium 40 MG Oral Tablet Delayed Release; 1 twice daily; Therapy:  (Recorded:11May2012) to 8. Polyethylene Glycol 3350 Oral Powder; Therapy: 01Dec2011 to 9. Pravastatin Sodium 40 MG Oral Tablet; Therapy: 11Jul2012 to 10. Singulair 10 MG Oral Tablet; 1 per day; Therapy: (Recorded:11May2012) to 11. Sulfamethoxazole-TMP DS 800-160 MG Oral Tablet; TAKE 1 TABLET Every twelve hours; Therapy:   21Sep2013 to (Complete:11Oct2013)  Requested for: 01Oct2013; Last Rx:01Oct2013  Allergies Medication  1. No Known Drug Allergies  Family History Problems  1. Family history of  Family Health Status Number Of Children 3; 1 son and 2 daughters 2. Family history of  Father Deceased At Age ____ 80's/bone cancer 3. Family history of  Mother Deceased At Age ____ 83/Cancer  Social History Problems  1. Caffeine  Use 4-5 per day 2. Never A Smoker Denied  3. History of  Alcohol Use 4. History of  Occupation:  Review of Systems Genitourinary, constitutional, skin, eye, otolaryngeal, hematologic/lymphatic, cardiovascular, pulmonary, endocrine, musculoskeletal, gastrointestinal, neurological and psychiatric system(s) were reviewed and pertinent findings if present are noted.  Genitourinary: urinary frequency, nocturia, testicular pain, scrotal pain, scrotal swelling and scrotal mass.  Gastrointestinal: heartburn.    Constitutional: feeling tired (fatigue).  ENT: sinus problems.  Endocrine: polydipsia.  Musculoskeletal: joint pain.  Psychiatric: depression.    Vitals Vital Signs [Data Includes: Last 1 Day]  10Oct2013 03:29PM  Blood Pressure: 125 / 72 Temperature: 98.4 F Heart Rate: 147  Physical Exam Constitutional: Well nourished and well developed . No acute distress.  Pulmonary: No respiratory distress and normal respiratory rhythm and effort.  Cardiovascular: Heart rate and rhythm are normal . No peripheral edema.  Abdomen: The abdomen is soft and nontender. No masses are palpated. No CVA tenderness. No hernias are palpable. No hepatosplenomegaly noted.  Genitourinary: The right epididymis is enlarged. The right testis is enlarged and tender. The left testis is atrophic.    Results/Data Urine [Data Includes: Last 1 Day]   10Oct2013  COLOR YELLOW   APPEARANCE CLEAR   SPECIFIC GRAVITY 1.020   pH 6.0   GLUCOSE NEG mg/dL  BILIRUBIN NEG   KETONE NEG mg/dL  BLOOD NEG   PROTEIN NEG mg/dL  UROBILINOGEN 0.2 mg/dL  NITRITE NEG   LEUKOCYTE ESTERASE NEG    Assessment Assessed  1. Orchitis With Epididymitis 604.90 2. Prostate Cancer 185  Plan Health Maintenance (V70.0)  1. UA With REFLEX  Done: 10Oct2013 03:17PM Orchitis With Epididymitis (604.90)  2. Follow-up Schedule Surgery Office  Follow-up  Requested for: 10Oct2013 Orchitis With Epididymitis (604.90), Prostate Cancer (185)  3. CefTRIAXone Sodium 1 GM Injection Solution Reconstituted; INJECT 1 GM Intramuscular; To Be  Done: 10Oct2013; Status: HOLD FOR - Administration  Discussion/Summary   Mr. Einstein main issue today is significant ongoing right-sided scrotal swelling. He is not acutely ill but on exam there is marked induration of the testicle and epididymis and a large area of fluctuance in the upper aspect of the scrotum. I am afraid he has a scrotal abscess and is not going to get better unless this gets surgically  drained. I suspect this has been partially treated and stabilized with antibiotics but is otherwise not going to resolve. At this point, with the weekend upon [place], I do think it is imperative that we get him on the surgical schedule for tomorrow. I would propose a scrotal exploration with incision and drainage of any purulence/pus. We will then carefully inspect the epididymis and testicle. There is certainly at least a moderate chance that we will require an epididymectomy or potential complete orchiectomy. I suspect this is an abscess that started in the epididymis and is now going towards the skin rather than a scrotal wall abscess per se. I will give him a gram of Rocephin injection today. We will tentatively get him on the schedule for sometime around lunch tomorrow. I would anticipate outpatient surgery. He is likely to have a wound that will require dressing changes.     Signatures Electronically signed by : Barron Alvine, M.D.; May 07 2012  7:54AM

## 2012-05-07 NOTE — Anesthesia Procedure Notes (Signed)
Procedure Name: LMA Insertion Date/Time: 05/07/2012 11:51 AM Performed by: Renella Cunas D Pre-anesthesia Checklist: Patient identified, Emergency Drugs available, Suction available and Patient being monitored Patient Re-evaluated:Patient Re-evaluated prior to inductionOxygen Delivery Method: Circle System Utilized Preoxygenation: Pre-oxygenation with 100% oxygen Intubation Type: IV induction Ventilation: Mask ventilation without difficulty LMA: LMA inserted LMA Size: 4.0 Number of attempts: 1 Airway Equipment and Method: bite block Placement Confirmation: positive ETCO2 Tube secured with: Tape Dental Injury: Teeth and Oropharynx as per pre-operative assessment

## 2012-05-07 NOTE — Anesthesia Preprocedure Evaluation (Signed)
Anesthesia Evaluation  Patient identified by MRN, date of birth, ID band Patient awake    Reviewed: Allergy & Precautions, H&P , NPO status , Patient's Chart, lab work & pertinent test results  Airway Mallampati: III TM Distance: >3 FB Neck ROM: Full    Dental  (+) Chipped, Teeth Intact and Dental Advisory Given,    Pulmonary neg pulmonary ROS,  breath sounds clear to auscultation  Pulmonary exam normal       Cardiovascular hypertension, Pt. on medications negative cardio ROS  Rhythm:Regular Rate:Normal     Neuro/Psych Polio age 67 negative psych ROS   GI/Hepatic Neg liver ROS, GERD-  Medicated,  Endo/Other  negative endocrine ROS  Renal/GU negative Renal ROS   Prostate CA, radioactive seed implant. negative genitourinary   Musculoskeletal   Abdominal   Peds  Hematology negative hematology ROS (+)   Anesthesia Other Findings   Reproductive/Obstetrics negative OB ROS                           Anesthesia Physical Anesthesia Plan  ASA: III  Anesthesia Plan: General   Post-op Pain Management:    Induction: Intravenous  Airway Management Planned: LMA  Additional Equipment:   Intra-op Plan:   Post-operative Plan: Extubation in OR  Informed Consent: I have reviewed the patients History and Physical, chart, labs and discussed the procedure including the risks, benefits and alternatives for the proposed anesthesia with the patient or authorized representative who has indicated his/her understanding and acceptance.   Dental advisory given  Plan Discussed with: CRNA  Anesthesia Plan Comments:         Anesthesia Quick Evaluation

## 2012-05-07 NOTE — Transfer of Care (Signed)
Immediate Anesthesia Transfer of Care Note  Patient: Eduardo Moon  Procedure(s) Performed: Procedure(s) (LRB): SCROTUM EXPLORATION (Right) ORCHIECTOMY (Right)  Patient Location: PACU  Anesthesia Type: General  Level of Consciousness: awake, oriented, sedated and patient cooperative  Airway & Oxygen Therapy: Patient Spontanous Breathing and Patient connected to face mask oxygen  Post-op Assessment: Report given to PACU RN and Post -op Vital signs reviewed and stable  Post vital signs: Reviewed and stable  Complications: No apparent anesthesia complications

## 2012-05-07 NOTE — Anesthesia Postprocedure Evaluation (Signed)
Anesthesia Post Note  Patient: Eduardo Moon  Procedure(s) Performed: Procedure(s) (LRB): SCROTUM EXPLORATION (Right) ORCHIECTOMY (Right)  Anesthesia type: General  Patient location: PACU  Post pain: Pain level controlled  Post assessment: Post-op Vital signs reviewed  Last Vitals:  Filed Vitals:   05/07/12 1315  BP: 157/89  Pulse: 91  Temp:   Resp: 14    Post vital signs: Reviewed  Level of consciousness: sedated  Complications: No apparent anesthesia complications

## 2012-05-07 NOTE — Op Note (Signed)
Preoperative diagnosis: Epididymal orchitis, scrotal abscess Postoperative diagnosis: Same  Procedure: Scrotal exploration with drainage of large abscess and orchiectomy   Surgeon: Valetta Fuller M.D  Indications: Patient is well known to me from prior diagnosis of prostate cancer treated with seed implantation. The patient developed acute onset of right-sided scrotal swelling. He was seen in the emergency department and diagnosed with epididymal orchitis. At that time clinical exam and ultrasound without definitive Evidence of scrotal abscess. The patient was in to see me yesterday for reassessment. He was nontoxic in appearance and afebrile but had a very enlarged right hemiscrotum with a markedly indurated testicle and a very fluctuant area in the upper aspect of the scrotum consistent with an abscess cavity. I did not feel this had any chance of resolving without drainage and that this likely originated in the epididymis and may require orchiectomy depending on the operative findings. This was discussed with this patient and his family. He presents now for further assessment and treatment.     Technique: Patient was brought to the operating room where he had successful induction of general anesthesia. He was kept in supine position. The patient's brace on his left leg for his polio was removed. The patient was then prepped and draped in usual manner. Compression boots were utilized and the patient had received Rocephin in our office. He also received 2 g Ancef prior to induction of anesthesia. Appropriate surgical timeout was performed. An incision was made in the right hemiscrotum. A large collection of purulent material was obtained. Anaerobic and aerobic cultures were obtained. There was a least a 40-50 cc of grossly. The material consistent with a large abscess. There was almost no normal tissue appreciated. There was marked thickening of the tunica vaginalis round testicle and the majority of that  tissue appeared markedly abnormal. Primarily with blunt dissection I was eventually able to free out the tunica vaginalis of the testicle from the scrotal wall. Examining this area revealed a markedly necrotic appearing epididymis. Clinically it appeared that this patient's had a severe epididymitis which subsequently resulted in a contiguous abscess. We felt that by leaving the testicle and epididymal structures he was going to be an extremely high like that of recurrence. For that reason I felt the patient would be better off having orchiectomy. We had discussed that in our office and I talked about the potential sequelae of an orchiectomy. Fertility is of no concern and testosterone to be replaced if he does drop low. We proceeded to take the testicle by dividing the spermatic cord into 2 bundles. Silk suture ligatures as well as ties were utilized on the vascular structure. The entire scrotal compartment was then copiously your gated with antibiotic solution and then packed with Curlex. We will plan on keeping the patient overnight for observation and setting him up with home health for wound care.

## 2012-05-08 LAB — CBC
Hemoglobin: 9.7 g/dL — ABNORMAL LOW (ref 13.0–17.0)
MCH: 29 pg (ref 26.0–34.0)
Platelets: 323 10*3/uL (ref 150–400)
RBC: 3.34 MIL/uL — ABNORMAL LOW (ref 4.22–5.81)
WBC: 8.8 10*3/uL (ref 4.0–10.5)

## 2012-05-08 LAB — BASIC METABOLIC PANEL
CO2: 22 mEq/L (ref 19–32)
Chloride: 99 mEq/L (ref 96–112)
Glucose, Bld: 163 mg/dL — ABNORMAL HIGH (ref 70–99)
Potassium: 4.5 mEq/L (ref 3.5–5.1)
Sodium: 131 mEq/L — ABNORMAL LOW (ref 135–145)

## 2012-05-08 NOTE — Progress Notes (Signed)
Assessment unchanged. Dr. Myrtice Lauth changed dressing to incisional area this am. Care manager was consultedto set up home health. HHRN from [facility] will follow pt at home. Pt and daughter verbalized understanding of dc instructions. Scripts x2 given as provided by MD. Pt and daughter introduced to MyChart with verbalized understanding. Pt discharged via wheelchair to front entrance to meet awaiting vehicle to carry home. Accompanied by NT and daughter and son-in-law.

## 2012-05-08 NOTE — Progress Notes (Signed)
   CARE MANAGEMENT NOTE 05/08/2012  Patient:  Eduardo Moon, Eduardo Moon   Account Number:  [id number]  Date Initiated:  05/08/2012  Documentation initiated by:  Johny Shock  Subjective/Objective Assessment:   Order for [facility] for dressing changes.     Action/Plan:   Pt daughter selected [facility], family will assist.   Anticipated DC Date:  05/08/2012   Anticipated DC Plan:  HOME W HOME HEALTH SERVICES         [facility] Choice  HOME HEALTH   Choice offered to / List presented to:  C-1 Patient        HH arranged  HH-1 RN      [facility] agency  [facility]   Status of service:  Completed, signed off Medicare Important Message given?   (If response is "NO", the following Medicare IM given date fields will be blank) Date Medicare IM given:   Date Additional Medicare IM given:    Discharge Disposition:  HOME W HOME HEALTH SERVICES  Per UR Regulation:    If discussed at Long Length of Stay Meetings, dates discussed:    Comments:

## 2012-05-08 NOTE — Progress Notes (Signed)
Vitals normal Laboratory tests OK Patient alert and stable Pain minimal and well-controlled Outer dressing changed last night Dressing change went very well NO HOME HEALTH ORGANIZED D/c home today

## 2012-05-09 NOTE — Progress Notes (Signed)
   CARE MANAGEMENT NOTE 05/09/2012  Patient:  Eduardo Moon, Eduardo Moon   Account Number:  [id number]  Date Initiated:  05/08/2012  Documentation initiated by:  Johny Shock  Subjective/Objective Assessment:   Order for [facility] for dressing changes.     Action/Plan:   Pt daughter selected [facility], family will assist.   Anticipated DC Date:  05/08/2012   Anticipated DC Plan:  HOME W HOME HEALTH SERVICES      DC Planning Services  CM consult      [facility] Choice  HOME HEALTH   Choice offered to / List presented to:  C-1 Patient        HH arranged  HH-1 RN      [facility] agency  [facility]   Status of service:  Completed, signed off Medicare Important Message given?   (If response is "NO", the following Medicare IM given date fields will be blank) Date Medicare IM given:   Date Additional Medicare IM given:    Discharge Disposition:  HOME W HOME HEALTH SERVICES  Per UR Regulation:    If discussed at Long Length of Stay Meetings, dates discussed:    Comments:  05/09/2012 1425 Received call from Unit RN and dtr, Melissa # [phone]. Dtr tried to call [facility] to have [facility] RN come out because packing came out of his wound. NCM notified [facility] and spoke to [place], [facility] scheduler. They will have HH RN go out today. His soc was for 10/14. She was going to call dtr to schedule [facility] RN. NCM will follow up with dtr.  Isidoro Donning RN CCM Case Mgmt phone [phone]

## 2012-05-10 ENCOUNTER — Encounter (HOSPITAL_BASED_OUTPATIENT_CLINIC_OR_DEPARTMENT_OTHER): Payer: Self-pay | Admitting: Urology

## 2012-05-12 LAB — ANAEROBIC CULTURE

## 2012-05-12 NOTE — Discharge Summary (Signed)
Physician Discharge Summary  Patient ID: Eduardo Moon MRN: [id number] DOB/AGE: October 30, 1945 66 y.o.  Admit date: 05/07/2012 Discharge date: 05/12/2012  Admission Diagnoses: Epididymal orchitis with scrotal abscess  Discharge Diagnoses:  Active Problems:  Epididymo-orchitis, acute  Scrotal abscess   Discharged Condition: good  Hospital Course: Patient underwent a scrotal exploration and orchiectomy. The patient had developed epididymal orchitis and been on antibiotics without significant improvement. Clinically he had fluctuance in the scrotum and was felt to have a scrotal abscess. At the time of exploration a fair amount of pus was obtained. It appeared to be emanating from the epididymis. We felt that given the findings at the time of surgery the patient would be better off having orchiectomy rather than trying to salvage that tissue and developing a high likelihood of chronic drainage and ongoing smoldering infection. The surgery itself otherwise went well. The patient was kept overnight for observation and to have initial wound check/dressing change. We also felt it prudent to have home health arranged for him.  Consults: None  Significant Diagnostic Studies: microbiology: wound culture: pending  Treatments: surgery: As above  Discharge Exam: Blood pressure 112/66, pulse 104, temperature 97.5 F (36.4 C), temperature source Oral, resp. rate 18, height 5\' 6"  (1.676 m), weight 78.926 kg (174 lb), SpO2 98.00%. The patient's physical exam, discharge is unknown since he was seen by one of my partners. Reported no concerning findings  Disposition: 01-Home or Self Care     Medication List     As of 05/12/2012  8:26 AM    TAKE these medications         doxycycline 50 MG capsule   Commonly known as: VIBRAMYCIN   Take 2 capsules (100 mg total) by mouth 2 (two) times daily.      hydrochlorothiazide 25 MG tablet   Commonly known as: HYDRODIURIL   Take 25 mg by mouth daily.     HYDROcodone-acetaminophen 7.5-500 MG per tablet   Commonly known as: LORTAB   Take 1 tablet by mouth every 6 (six) hours as needed. Pain      ibuprofen 200 MG tablet   Commonly known as: ADVIL,MOTRIN   Take 200 mg by mouth every 6 (six) hours as needed. Pain      lisinopril 20 MG tablet   Commonly known as: PRINIVIL,ZESTRIL   Take 20 mg by mouth every morning.      potassium chloride SA 20 MEQ tablet   Commonly known as: K-DUR,KLOR-CON   Take 1 tablet (20 mEq total) by mouth daily.      sulfamethoxazole-trimethoprim 800-160 MG per tablet   Commonly known as: BACTRIM DS,SEPTRA DS   Take 1 tablet by mouth every 12 (twelve) hours.           Follow-up Information    Please follow up. (We will call to arrange)          Signed: Philippe Gang S 05/12/2012, 8:26 AM

## 2012-05-28 LAB — CULTURE, ROUTINE-ABSCESS

## 2012-06-03 LAB — FUNGUS CULTURE W SMEAR: Fungal Smear: NONE SEEN

## 2012-06-20 LAB — AFB CULTURE WITH SMEAR (NOT AT ARMC)

## 2020-05-28 DIAGNOSIS — R531 Weakness: Secondary | ICD-10-CM

## 2020-05-28 HISTORY — DX: Weakness: R53.1

## 2020-06-11 ENCOUNTER — Inpatient Hospital Stay (HOSPITAL_COMMUNITY)
Admission: EM | Admit: 2020-06-11 | Discharge: 2020-06-15 | DRG: 690 | Disposition: A | Discharge status: Home Health | Payer: Medicare Other | Attending: Internal Medicine | Admitting: Internal Medicine

## 2020-06-11 ENCOUNTER — Other Ambulatory Visit: Payer: Self-pay

## 2020-06-11 ENCOUNTER — Encounter (HOSPITAL_COMMUNITY): Payer: Self-pay

## 2020-06-11 DIAGNOSIS — E119 Type 2 diabetes mellitus without complications: Secondary | ICD-10-CM

## 2020-06-11 DIAGNOSIS — R531 Weakness: Secondary | ICD-10-CM | POA: Diagnosis not present

## 2020-06-11 DIAGNOSIS — N19 Unspecified kidney failure: Secondary | ICD-10-CM | POA: Diagnosis present

## 2020-06-11 DIAGNOSIS — Z8546 Personal history of malignant neoplasm of prostate: Secondary | ICD-10-CM

## 2020-06-11 DIAGNOSIS — N3000 Acute cystitis without hematuria: Secondary | ICD-10-CM | POA: Diagnosis not present

## 2020-06-11 DIAGNOSIS — M109 Gout, unspecified: Secondary | ICD-10-CM | POA: Diagnosis present

## 2020-06-11 DIAGNOSIS — I10 Essential (primary) hypertension: Secondary | ICD-10-CM | POA: Diagnosis present

## 2020-06-11 DIAGNOSIS — N179 Acute kidney failure, unspecified: Secondary | ICD-10-CM | POA: Diagnosis present

## 2020-06-11 DIAGNOSIS — E876 Hypokalemia: Secondary | ICD-10-CM | POA: Diagnosis present

## 2020-06-11 DIAGNOSIS — E785 Hyperlipidemia, unspecified: Secondary | ICD-10-CM | POA: Diagnosis present

## 2020-06-11 DIAGNOSIS — E860 Dehydration: Secondary | ICD-10-CM | POA: Diagnosis present

## 2020-06-11 DIAGNOSIS — Z20822 Contact with and (suspected) exposure to covid-19: Secondary | ICD-10-CM | POA: Diagnosis present

## 2020-06-11 DIAGNOSIS — Z7984 Long term (current) use of oral hypoglycemic drugs: Secondary | ICD-10-CM

## 2020-06-11 DIAGNOSIS — D638 Anemia in other chronic diseases classified elsewhere: Secondary | ICD-10-CM | POA: Diagnosis present

## 2020-06-11 DIAGNOSIS — B91 Sequelae of poliomyelitis: Secondary | ICD-10-CM

## 2020-06-11 DIAGNOSIS — I159 Secondary hypertension, unspecified: Secondary | ICD-10-CM | POA: Diagnosis not present

## 2020-06-11 DIAGNOSIS — Z79899 Other long term (current) drug therapy: Secondary | ICD-10-CM

## 2020-06-11 DIAGNOSIS — B9689 Other specified bacterial agents as the cause of diseases classified elsewhere: Secondary | ICD-10-CM | POA: Diagnosis present

## 2020-06-11 DIAGNOSIS — N390 Urinary tract infection, site not specified: Principal | ICD-10-CM | POA: Diagnosis present

## 2020-06-11 DIAGNOSIS — G8194 Hemiplegia, unspecified affecting left nondominant side: Secondary | ICD-10-CM | POA: Diagnosis present

## 2020-06-11 LAB — RESPIRATORY PANEL BY RT PCR (FLU A&B, COVID)
Influenza A by PCR: NEGATIVE
Influenza B by PCR: NEGATIVE
SARS Coronavirus 2 by RT PCR: NEGATIVE

## 2020-06-11 LAB — URINALYSIS, ROUTINE W REFLEX MICROSCOPIC
Bilirubin Urine: NEGATIVE
Glucose, UA: NEGATIVE mg/dL
Ketones, ur: 5 mg/dL — AB
Nitrite: POSITIVE — AB
Protein, ur: 30 mg/dL — AB
Specific Gravity, Urine: 1.018 (ref 1.005–1.030)
WBC, UA: >50 WBC/hpf — ABNORMAL HIGH (ref 0–5)
pH: 5 (ref 5.0–8.0)

## 2020-06-11 LAB — CBC WITH DIFFERENTIAL/PLATELET
Abs Immature Granulocytes: 0.19 10*3/uL — ABNORMAL HIGH (ref 0.00–0.07)
Basophils Absolute: 0.1 10*3/uL (ref 0.0–0.1)
Basophils Relative: 0 %
Eosinophils Absolute: 0 10*3/uL (ref 0.0–0.5)
Eosinophils Relative: 0 %
HCT: 33.6 % — ABNORMAL LOW (ref 39.0–52.0)
Hemoglobin: 10.4 g/dL — ABNORMAL LOW (ref 13.0–17.0)
Immature Granulocytes: 1 %
Lymphocytes Relative: 5 %
Lymphs Abs: 0.9 10*3/uL (ref 0.7–4.0)
MCH: 28.3 pg (ref 26.0–34.0)
MCHC: 31 g/dL (ref 30.0–36.0)
MCV: 91.6 fL (ref 80.0–100.0)
Monocytes Absolute: 1 10*3/uL (ref 0.1–1.0)
Monocytes Relative: 5 %
Neutro Abs: 16.9 10*3/uL — ABNORMAL HIGH (ref 1.7–7.7)
Neutrophils Relative %: 89 %
Platelets: 656 10*3/uL — ABNORMAL HIGH (ref 150–400)
RBC: 3.67 MIL/uL — ABNORMAL LOW (ref 4.22–5.81)
RDW: 14.2 % (ref 11.5–15.5)
WBC: 19.1 10*3/uL — ABNORMAL HIGH (ref 4.0–10.5)
nRBC: 0 % (ref 0.0–0.2)

## 2020-06-11 LAB — COMPREHENSIVE METABOLIC PANEL
ALT: 17 U/L (ref 0–44)
AST: 13 U/L — ABNORMAL LOW (ref 15–41)
Albumin: 3 g/dL — ABNORMAL LOW (ref 3.5–5.0)
Alkaline Phosphatase: 83 U/L (ref 38–126)
Anion gap: 15 (ref 5–15)
BUN: 17 mg/dL (ref 8–23)
CO2: 23 mmol/L (ref 22–32)
Calcium: 8.6 mg/dL — ABNORMAL LOW (ref 8.9–10.3)
Chloride: 104 mmol/L (ref 98–111)
Creatinine, Ser: 1.31 mg/dL — ABNORMAL HIGH (ref 0.61–1.24)
GFR, Estimated: 56 mL/min — ABNORMAL LOW (ref >60)
Glucose, Bld: 147 mg/dL — ABNORMAL HIGH (ref 70–99)
Potassium: 3.2 mmol/L — ABNORMAL LOW (ref 3.5–5.1)
Sodium: 142 mmol/L (ref 135–145)
Total Bilirubin: 0.6 mg/dL (ref 0.3–1.2)
Total Protein: 7.5 g/dL (ref 6.5–8.1)

## 2020-06-11 LAB — GLUCOSE, CAPILLARY: Glucose-Capillary: 137 mg/dL — ABNORMAL HIGH (ref 70–99)

## 2020-06-11 LAB — HEMOGLOBIN A1C
Hgb A1c MFr Bld: 5.4 % (ref 4.8–5.6)
Mean Plasma Glucose: 108.28 mg/dL

## 2020-06-11 MED ORDER — SODIUM CHLORIDE 0.9 % IV SOLN
1.0000 g | Freq: Once | INTRAVENOUS | Status: AC
  Administered 2020-06-11: 1 g via INTRAVENOUS
  Filled 2020-06-11: qty 10

## 2020-06-11 MED ORDER — SODIUM CHLORIDE 0.9 % IV BOLUS
500.0000 mL | Freq: Once | INTRAVENOUS | Status: AC
  Administered 2020-06-11: 500 mL via INTRAVENOUS

## 2020-06-11 MED ORDER — PANTOPRAZOLE SODIUM 40 MG PO TBEC
40.0000 mg | DELAYED_RELEASE_TABLET | Freq: Every day | ORAL | Status: DC
  Administered 2020-06-11 – 2020-06-15 (×5): 40 mg via ORAL
  Filled 2020-06-11 (×5): qty 1

## 2020-06-11 MED ORDER — ALLOPURINOL 100 MG PO TABS
100.0000 mg | ORAL_TABLET | Freq: Every day | ORAL | Status: DC
  Filled 2020-06-11: qty 1

## 2020-06-11 MED ORDER — TRAZODONE HCL 50 MG PO TABS
25.0000 mg | ORAL_TABLET | Freq: Every day | ORAL | Status: DC
  Administered 2020-06-11 – 2020-06-14 (×4): 25 mg via ORAL
  Filled 2020-06-11 (×4): qty 1

## 2020-06-11 MED ORDER — ROSUVASTATIN CALCIUM 5 MG PO TABS
5.0000 mg | ORAL_TABLET | Freq: Every day | ORAL | Status: DC
  Administered 2020-06-11 – 2020-06-15 (×5): 5 mg via ORAL
  Filled 2020-06-11 (×5): qty 1

## 2020-06-11 MED ORDER — ACETAMINOPHEN 325 MG PO TABS: 650.0000 mg | ORAL_TABLET | Freq: Four times a day (QID) | ORAL | Status: DC | PRN

## 2020-06-11 MED ORDER — POTASSIUM CHLORIDE CRYS ER 20 MEQ PO TBCR
20.0000 meq | EXTENDED_RELEASE_TABLET | Freq: Every day | ORAL | Status: DC
  Administered 2020-06-11 – 2020-06-15 (×4): 20 meq via ORAL
  Filled 2020-06-11 (×4): qty 1

## 2020-06-11 MED ORDER — ACETAMINOPHEN 650 MG RE SUPP
650.0000 mg | Freq: Three times a day (TID) | RECTAL | Status: DC
  Administered 2020-06-13: 650 mg via RECTAL

## 2020-06-11 MED ORDER — ACETAMINOPHEN 650 MG RE SUPP: 650.0000 mg | Freq: Four times a day (QID) | RECTAL | Status: DC | PRN

## 2020-06-11 MED ORDER — ENOXAPARIN SODIUM 40 MG/0.4ML ~~LOC~~ SOLN
40.0000 mg | Freq: Every day | SUBCUTANEOUS | Status: DC
  Administered 2020-06-11 – 2020-06-14 (×4): 40 mg via SUBCUTANEOUS
  Filled 2020-06-11 (×4): qty 0.4

## 2020-06-11 MED ORDER — ALLOPURINOL 100 MG PO TABS
100.0000 mg | ORAL_TABLET | Freq: Two times a day (BID) | ORAL | Status: DC
  Administered 2020-06-11 – 2020-06-15 (×8): 100 mg via ORAL
  Filled 2020-06-11 (×8): qty 1

## 2020-06-11 MED ORDER — DIAZEPAM 5 MG PO TABS
5.0000 mg | ORAL_TABLET | Freq: Three times a day (TID) | ORAL | Status: DC | PRN
  Administered 2020-06-12: 5 mg via ORAL
  Filled 2020-06-11: qty 1

## 2020-06-11 MED ORDER — HYDROCODONE-ACETAMINOPHEN 10-325 MG PO TABS
1.0000 | ORAL_TABLET | Freq: Four times a day (QID) | ORAL | Status: DC | PRN
  Administered 2020-06-12 – 2020-06-15 (×12): 1 via ORAL
  Filled 2020-06-11 (×15): qty 1

## 2020-06-11 MED ORDER — SENNA 8.6 MG PO TABS
1.0000 | ORAL_TABLET | Freq: Two times a day (BID) | ORAL | Status: DC
  Administered 2020-06-11 – 2020-06-15 (×8): 8.6 mg via ORAL
  Filled 2020-06-11 (×8): qty 1

## 2020-06-11 MED ORDER — ACETAMINOPHEN 500 MG PO TABS
1000.0000 mg | ORAL_TABLET | Freq: Three times a day (TID) | ORAL | Status: DC
  Administered 2020-06-11 – 2020-06-14 (×6): 1000 mg via ORAL
  Filled 2020-06-11 (×7): qty 2

## 2020-06-11 MED ORDER — METOPROLOL SUCCINATE ER 50 MG PO TB24
100.0000 mg | ORAL_TABLET | Freq: Every day | ORAL | Status: DC
  Administered 2020-06-11 – 2020-06-15 (×5): 100 mg via ORAL
  Filled 2020-06-11 (×5): qty 2

## 2020-06-11 MED ORDER — IBUPROFEN 200 MG PO TABS
200.0000 mg | ORAL_TABLET | Freq: Four times a day (QID) | ORAL | Status: DC | PRN
  Administered 2020-06-14: 200 mg via ORAL
  Filled 2020-06-11: qty 1

## 2020-06-11 MED ORDER — INSULIN ASPART 100 UNIT/ML ~~LOC~~ SOLN
0.0000 [IU] | Freq: Three times a day (TID) | SUBCUTANEOUS | Status: DC
  Filled 2020-06-11: qty 0.15

## 2020-06-11 MED ORDER — SODIUM CHLORIDE 0.9 % IV SOLN
1.0000 g | INTRAVENOUS | Status: DC
  Administered 2020-06-12 – 2020-06-14 (×3): 1 g via INTRAVENOUS
  Filled 2020-06-11 (×2): qty 1
  Filled 2020-06-11: qty 10

## 2020-06-11 MED ORDER — SODIUM CHLORIDE 0.45 % IV SOLN: INTRAVENOUS | Status: DC

## 2020-06-11 MED ORDER — LISINOPRIL 20 MG PO TABS
30.0000 mg | ORAL_TABLET | Freq: Every day | ORAL | Status: DC
  Administered 2020-06-11 – 2020-06-14 (×4): 30 mg via ORAL
  Filled 2020-06-11 (×4): qty 1

## 2020-06-11 NOTE — ED Notes (Signed)
No ambulation, MD said not to.

## 2020-06-11 NOTE — ED Notes (Signed)
Admitting provider at bedside.

## 2020-06-11 NOTE — ED Provider Notes (Signed)
[facility] DEPT Provider Note   CSN: [id number] Arrival date & time: 06/11/20  1314     History No chief complaint on file.   Eduardo Moon is a 74 y.o. male.  HPI   Patient with significant medical history of arthritis, prostate cancer, hypertension, polio with left leg paralysis presents to the emergency department with chief complaint of worsening weakness x3 weeks.  Patient endorses that he has felt weak for the past 3 weeks and states this is coming from his gout.  He states he gets gout flareups typically in his wrists and right knee and the worsening pain  prevents him from getting up and walking around. He went to his primary care doctor who prescribed him allopurinol and endorses that the swelling has gone down significantly. He still endorses that he still has pain in his wrist and knee.  Today he was unable to get up off the bed because he felt weak and with worsening pain in his knee and wrist.  He says generally at baseline he is able to up walking around without no difficulty.  He denies fevers, chills, ear congestion, sore throat, abdominal pain, nausea, vomiting, diarrhea, constipation or urinary symptoms.  Patient's daughter who is at bedside endorses that patient has been extremely weak lately,  unable to get up off the couch has not been eating and drinking like he normally does.  She is concerned that his gout is getting worsening.    Past Medical History:  Diagnosis Date   Arthritis    History of prostate cancer s/p radiactive seed implants  03-24-2011   Hypertension    Mild acid reflux watches diet   Polio age 17   Scrotal abscess    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   uses w/c for distance    Patient Active Problem List   Diagnosis Date Noted   HTN (hypertension) 06/11/2020   Gout attack 06/11/2020   Acute lower UTI 06/11/2020   Weakness 06/11/2020   UTI (urinary tract infection) 06/11/2020   Epididymo-orchitis,  acute 05/07/2012   Scrotal abscess 05/07/2012    Past Surgical History:  Procedure Laterality Date   ANKLE RECONSTRUCTION  1957   BILATERAL   ORCHIECTOMY  05/07/2012   Procedure: ORCHIECTOMY;  Surgeon: Bernestine Amass, MD;  Location: [facility];  Service: Urology;  Laterality: Right;   RADIOACTIVE PROSTATE SEED IMPLANTS  04-21-2011    DR Risa Grill   PROSTATE CANCER   SCROTAL EXPLORATION  05/07/2012   Procedure: SCROTUM EXPLORATION;  Surgeon: Bernestine Amass, MD;  Location: [facility];  Service: Urology;  Laterality: Right;  DRAINAGE OF ABSCESS  1 HR  [employer] MEDICARE       History reviewed. No pertinent family history.  Social History   Tobacco Use   Smoking status: Never Smoker   Smokeless tobacco: Never Used  Substance Use Topics   Alcohol use: No   Drug use: No    Home Medications Prior to Admission medications   Medication Sig Start Date End Date Taking? Authorizing Provider  HYDROcodone-acetaminophen (NORCO) 7.5-325 MG tablet Take 1 tablet by mouth 4 (four) times daily as needed for pain. 05/23/20  Yes [provider]  lisinopril (ZESTRIL) 30 MG tablet Take 30 mg by mouth daily. 04/18/20  Yes [provider]  rosuvastatin (CRESTOR) 5 MG tablet Take 5 mg by mouth daily. 04/23/20  Yes [provider]  allopurinol (ZYLOPRIM) 100 MG tablet Take 100 mg by mouth daily.  05/23/20   [provider]  diazepam (VALIUM) 5 MG tablet Take 5 mg by mouth 3 (three) times daily as needed for anxiety. 05/25/20   [provider]  doxycycline (VIBRAMYCIN) 50 MG capsule Take 2 capsules (100 mg total) by mouth 2 (two) times daily. 05/07/12   Rana Snare, MD  HYDROcodone-acetaminophen (LORTAB) 7.5-500 MG per tablet Take 1 tablet by mouth every 6 (six) hours as needed. Pain    [provider]  ibuprofen (ADVIL,MOTRIN) 200 MG tablet Take 200 mg by mouth every 6 (six) hours as needed. Pain    [provider]  lisinopril (PRINIVIL,ZESTRIL) 20 MG tablet Take 20 mg by mouth every morning.     [provider]  metFORMIN (GLUCOPHAGE) 500 MG tablet Take 500 mg by mouth daily. 04/23/20   [provider]  metoprolol succinate (TOPROL-XL) 100 MG 24 hr tablet Take 100 mg by mouth daily. 03/22/20   [provider]  naproxen (NAPROSYN) 375 MG tablet Take 375 mg by mouth 2 (two) times daily. 05/25/20   [provider]  omeprazole (PRILOSEC) 20 MG capsule Take 20 mg by mouth 2 (two) times daily. 04/18/20   [provider]  potassium chloride SA (K-DUR,KLOR-CON) 20 MEQ tablet Take 1 tablet (20 mEq total) by mouth daily. 04/16/12   Barbara Cower, MD  sulfamethoxazole-trimethoprim (SEPTRA DS) 800-160 MG per tablet Take 1 tablet by mouth every 12 (twelve) hours. 04/16/12   Barbara Cower, MD  Vitamin D, Ergocalciferol, (DRISDOL) 1.25 MG (50000 UNIT) CAPS capsule Take 50,000 Units by mouth once a week. 04/24/20   [provider]    Allergies    Patient has no known allergies.  Review of Systems   Review of Systems  Constitutional: Negative for chills and fever.  HENT: Negative for congestion.   Respiratory: Negative for cough and shortness of breath.   Cardiovascular: Negative for chest pain.  Gastrointestinal: Negative for abdominal pain, diarrhea, nausea and vomiting.  Genitourinary: Negative for enuresis and flank pain.  Musculoskeletal: Positive for joint swelling. Negative for back pain.       Patient endorses bilateral wrist pain and right knee pain.  Skin: Negative for rash.  Neurological: Negative for dizziness and headaches.  Hematological: Does not bruise/bleed easily.    Physical Exam Updated Vital Signs BP (!) 169/87    Pulse 89    Temp 99 F (37.2 C) (Oral)    Resp 18    SpO2 99%   Physical Exam Vitals and nursing note reviewed. Exam conducted with a chaperone present.  Constitutional:      General: He is not in acute  distress.    Appearance: He is not ill-appearing.  HENT:     Head: Normocephalic and atraumatic.     Right Ear: Tympanic membrane, ear canal and external ear normal.     Left Ear: Tympanic membrane, ear canal and external ear normal.     Nose: No congestion.     Mouth/Throat:     Mouth: Mucous membranes are moist.     Pharynx: Oropharynx is clear. No oropharyngeal exudate or posterior oropharyngeal erythema.  Eyes:     Conjunctiva/sclera: Conjunctivae normal.  Cardiovascular:     Rate and Rhythm: Normal rate and regular rhythm.     Pulses: Normal pulses.     Heart sounds: No murmur heard.  No friction rub. No gallop.   Pulmonary:     Effort: No respiratory distress.     Breath sounds: No wheezing, rhonchi  or rales.  Abdominal:     General: There is no distension.     Tenderness: There is no abdominal tenderness. There is no right CVA tenderness, left CVA tenderness or guarding.  Musculoskeletal:        General: Tenderness present. No swelling.     Right lower leg: No edema.     Left lower leg: No edema.     Comments: Patient's upper extremities were visualized no edema or erythema noted, no  ecchymosis or abrasions present.  Patient had noted decreased range of motion in his wrists difficulty with extension or flexion, he had good grip strength, full range of motion his fingers.  Neurovascular fully intact.  He was tender to palpation along the distal anterior aspect of ulna bilaterally.  No crepitus or gross abnormalities noted.  Patient's lower extremities were visualized patient's left leg did have a brace and is paralyzed from the hip down from polio.  Neurovascular fully intact. Right lower extremity had no gross abnormalities noted on right knee, it was nonedematous, no erythema, no lacerations or abrasions present.  He had full range of motion, neurovascular fully intact in his toes in his right leg.    Skin:    General: Skin is warm and dry.     Findings: No rash.    Neurological:     Mental Status: He is alert.  Psychiatric:        Mood and Affect: Mood normal.     ED Results / Procedures / Treatments   Labs (all labs ordered are listed, but only abnormal results are displayed) Labs Reviewed  COMPREHENSIVE METABOLIC PANEL - Abnormal; Notable for the following components:      Result Value   Potassium 3.2 (*)    Glucose, Bld 147 (*)    Creatinine, Ser 1.31 (*)    Calcium 8.6 (*)    Albumin 3.0 (*)    AST 13 (*)    GFR, Estimated 56 (*)    All other components within normal limits  CBC WITH DIFFERENTIAL/PLATELET - Abnormal; Notable for the following components:   WBC 19.1 (*)    RBC 3.67 (*)    Hemoglobin 10.4 (*)    HCT 33.6 (*)    Platelets 656 (*)    Neutro Abs 16.9 (*)    Abs Immature Granulocytes 0.19 (*)    All other components within normal limits  URINALYSIS, ROUTINE W REFLEX MICROSCOPIC - Abnormal; Notable for the following components:   APPearance HAZY (*)    Hgb urine dipstick MODERATE (*)    Ketones, ur 5 (*)    Protein, ur 30 (*)    Nitrite POSITIVE (*)    Leukocytes,Ua LARGE (*)    WBC, UA >50 (*)    Bacteria, UA MANY (*)    All other components within normal limits  RESPIRATORY PANEL BY RT PCR (FLU A&B, COVID)  URINE CULTURE  HEMOGLOBIN Y3K  BASIC METABOLIC PANEL    EKG None  Radiology No results found.  Procedures Procedures (including critical care time)  Medications Ordered in ED Medications  allopurinol (ZYLOPRIM) tablet 100 mg (has no administration in time range)  HYDROcodone-acetaminophen (NORCO) 10-325 MG per tablet 1 tablet (has no administration in time range)  ibuprofen (ADVIL) tablet 200 mg (has no administration in time range)  lisinopril (ZESTRIL) tablet 30 mg (has no administration in time range)  metoprolol succinate (TOPROL-XL) 24 hr tablet 100 mg (has no administration in time range)  rosuvastatin (CRESTOR) tablet 5 mg (  has no administration in time range)  diazepam (VALIUM) tablet 5  mg (has no administration in time range)  pantoprazole (PROTONIX) EC tablet 40 mg (has no administration in time range)  potassium chloride SA (KLOR-CON) CR tablet 20 mEq (has no administration in time range)  enoxaparin (LOVENOX) injection 40 mg (has no administration in time range)  0.45 % sodium chloride infusion (has no administration in time range)  acetaminophen (TYLENOL) tablet 650 mg (has no administration in time range)    Or  acetaminophen (TYLENOL) suppository 650 mg (has no administration in time range)  senna (SENOKOT) tablet 8.6 mg (has no administration in time range)  insulin aspart (novoLOG) injection 0-15 Units (has no administration in time range)  cefTRIAXone (ROCEPHIN) 1 g in sodium chloride 0.9 % 100 mL IVPB (has no administration in time range)  sodium chloride 0.9 % bolus 500 mL (500 mLs Intravenous New Bag/Given 06/11/20 1812)  cefTRIAXone (ROCEPHIN) 1 g in sodium chloride 0.9 % 100 mL IVPB (1 g Intravenous New Bag/Given 06/11/20 1822)    ED Course  I have reviewed the triage vital signs and the nursing notes.  Pertinent labs & imaging results that were available during my care of the patient were reviewed by me and considered in my medical decision making (see chart for details).    MDM Rules/Calculators/A&P                           Patient presents with worsening weakness, patient was alert, does not appear in acute distress, vital signs reassuring.  Will order screening labs for further evaluation.   Try to ambulate patient to assess weakness and patient was unable to get himself out of bed. Due to UTI and worsening weakness will consult hospitalist for further evaluation management. Spoke with Dr. Danice Goltz who has agreed except the patient will come down and evaluate him.  CBC shows leukocytosis of 19.1, normocytic anemia which appears to be a baseline for patient. CMP shows hypokalemia of 3.2, no metabolic acidosis, hyperglycemia of 147, elevated creatinine of  131, hypoalbumin, no anion gap present. Respiratory panel negative for influenza, Covid. UA consistent with UTI, positive nitrates, large leukocytes, many bacteria many white blood cells. Will send for culture. Start patient on Rocephin.  I have low suspicion for CVA or intracranial head bleed as patient denies recent falls, is on anticoagulant, no neuro deficits noted on exam. Low suspicion for ACS or arrhythmias as patient has chest pain, shortness of breath, no signs of hypoperfusion fluid overload noted on exam. Low suspicion for respiratory abnormality as lung sounds were clear bilaterally, patient is nontachypneic, satting 100% room air. Low suspicion for intra-abdominal abnormality requiring surgical intervention as abdomen was soft nontender to palpation, no acute abdomen noted on exam. Low suspicion for pyelonephritis as patient had negative CVA tenderness, denies nausea, vomiting, abdominal pain. I suspect patient's elevated white count slight tachycardia is secondary to an uncomplicated UTI slightly dehydrated with an elevated creatinine. will start patient on ceftriaxone and fluids.  Patient care will be turned over to hospice team for further evaluation. Final Clinical Impression(s) / ED Diagnoses Final diagnoses:  Acute cystitis without hematuria  Weakness    Rx / DC Orders ED Discharge Orders    None       Marcello Fennel, PA-C 06/11/20 1906    Maudie Flakes, MD 06/11/20 2351

## 2020-06-11 NOTE — ED Notes (Signed)
Pt needing 2 person assistance to transfer from wheelchair to stretcher. Pt reports difficulty in bearing own weight. Arrives with BL leg braces in place.

## 2020-06-11 NOTE — H&P (Signed)
History and Physical    Eduardo Moon NTI:[id number] DOB: 11-06-1945 DOA: 06/11/2020  PCP: Dr. Nira Conn ______, [name], [place] Patient coming from: home  I have personally briefly reviewed patient's old medical records in [place]  Chief Complaint: increased weakness and inability to ambulate  HPI: Eduardo Moon is a 74 y.o. 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 Due to his change in condition he presents to [facility] for evaluation.    ED Course: T 99, BP 181/135, HR 99, RR 24. Leukocytosis with WBC 19.1, mild anemia with Hgb 10.4, diff 89/5/5 Cmet with glucose 147, Ct 1.31 (last 2013 was 1.06). UA postive with >50 WBC/hpf and many bacteria. Patient in ED unable to get up off stretcher or ambulate. He was given 500 cc IVF bolus and 1 g Rocephin IV> TRH called to admit for continued treatment and evaluation.   Review of Systems: As per HPI otherwise 10 point review of systems negative, except for right knee, right ankle and right  hand and wrist pain.   Past Medical History:  Diagnosis Date  . Arthritis   . History of prostate cancer s/p radiactive seed implants  03-24-2011  . Hypertension   . Mild acid reflux watches diet  . Polio age 27  . Scrotal abscess   . 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 ANKLE RECONSTRUCTION  1957   BILATERAL  . ORCHIECTOMY  05/07/2012   Procedure: ORCHIECTOMY;  Surgeon: Bernestine Amass, MD;  Location: [facility];  Service: Urology;  Laterality: Right;  . RADIOACTIVE PROSTATE SEED IMPLANTS  04-21-2011    [facility]   PROSTATE CANCER  . SCROTAL EXPLORATION  05/07/2012   Procedure: SCROTUM EXPLORATION;  Surgeon: Bernestine Amass, MD;  Location: [facility];  Service: Urology;   Laterality: Right;  DRAINAGE OF ABSCESS  1 HR  [employer] MEDICARE    Soc Hx - married 35 years - widowed. Has two daughters and one son. 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 Lives with his daughter and her family.    reports that he has never smoked. He has never used smokeless tobacco. He reports that he does not drink alcohol and does not use drugs.  No Known Allergies  History reviewed. No pertinent family history.   Prior to Admission medications   Medication Sig Start Date End Date Taking? Authorizing Provider  HYDROcodone-acetaminophen (NORCO) 7.5-325 MG tablet Take 1 tablet by mouth 4 (four) times daily as needed for pain. 05/23/20  Yes [provider]  lisinopril (ZESTRIL) 30 MG tablet Take 30 mg by mouth daily. 04/18/20  Yes [provider]  rosuvastatin (CRESTOR) 5 MG tablet Take 5 mg by mouth daily. 04/23/20  Yes [provider]  allopurinol (ZYLOPRIM) 100 MG tablet Take 100 mg by mouth daily. 05/23/20   [provider]  diazepam (VALIUM) 5 MG tablet Take 5 mg by mouth 3 (three) times daily as needed for anxiety. 05/25/20   [provider]  doxycycline (VIBRAMYCIN) 50 MG capsule Take 2 capsules (100 mg total) by mouth 2 (two) times daily. 05/07/12   Rana Snare, MD  HYDROcodone-acetaminophen (LORTAB) 7.5-500 MG per tablet Take 1 tablet by mouth every  6 (six) hours as needed. Pain    [provider]  ibuprofen (ADVIL,MOTRIN) 200 MG tablet Take 200 mg by mouth every 6 (six) hours as needed. Pain    [provider]  lisinopril (PRINIVIL,ZESTRIL) 20 MG tablet Take 20 mg by mouth every morning.     [provider]  metFORMIN (GLUCOPHAGE) 500 MG tablet Take 500 mg by mouth daily. 04/23/20   [provider]  metoprolol succinate (TOPROL-XL) 100 MG 24 hr tablet Take 100 mg by mouth daily. 03/22/20   [provider]  naproxen (NAPROSYN) 375 MG tablet Take 375 mg by  mouth 2 (two) times daily. 05/25/20   [provider]  omeprazole (PRILOSEC) 20 MG capsule Take 20 mg by mouth 2 (two) times daily. 04/18/20   [provider]  potassium chloride SA (K-DUR,KLOR-CON) 20 MEQ tablet Take 1 tablet (20 mEq total) by mouth daily. 04/16/12   Barbara Cower, MD  sulfamethoxazole-trimethoprim (SEPTRA DS) 800-160 MG per tablet Take 1 tablet by mouth every 12 (twelve) hours. 04/16/12   Barbara Cower, MD  Vitamin D, Ergocalciferol, (DRISDOL) 1.25 MG (50000 UNIT) CAPS capsule Take 50,000 Units by mouth once a week. 04/24/20   [provider]    Physical Exam: Vitals:   06/11/20 1730 06/11/20 1745 06/11/20 1800 06/11/20 1900  BP: (!) 181/135 (!) 178/88 (!) 169/87 (!) 169/78  Pulse: 99 86 89 90  Resp: (!) 24 17 18 19   Temp:      TempSrc:      SpO2: 99% 100% 99% 100%     Vitals:   06/11/20 1730 06/11/20 1745 06/11/20 1800 06/11/20 1900  BP: (!) 181/135 (!) 178/88 (!) 169/87 (!) 169/78  Pulse: 99 86 89 90  Resp: (!) 24 17 18 19   Temp:      TempSrc:      SpO2: 99% 100% 99% 100%   Gneeral:- WNWD man in no acute distress Eyes: PERRL, lids and conjunctivae normal ENMT: Mucous membranes are moist. Posterior pharynx clear of any exudate or lesions.Poor  Dentition missing most of his upper teeth. Has several teeth broken off at the gum line.  Neck: normal, supple, no masses, no thyromegaly Respiratory: clear to auscultation bilaterally, no wheezing, no crackles. Normal respiratory effort. No accessory muscle use.  Cardiovascular: Regular rate and rhythm, no murmurs / rubs / gallops. No extremity edema. 2+ pedal pulses. No carotid bruits.  Abdomen: no tenderness, no masses palpated. No hepatosplenomegaly. Bowel sounds positive.  Musculoskeletal: no clubbing / cyanosis. No joint deformity upper exept for thickening of the right wrist. Left LE in long brace - chronic, post-polio. Good ROM, no contractures. Normal muscle tone. Right wrist, knee  and ankle w/o acute erythema, calore or exqusite tenderness. Skin: no rashes, lesions, ulcers. No induration Neurologic: CN 2-12 grossly intact. Sensation intact, DTR normal. Strength 5/5 in all 4.  Psychiatric: Normal judgment and insight. Alert and oriented x 3. Normal mood.     Labs on Admission: I have personally reviewed following labs and imaging studies  CBC: Recent Labs  Lab 06/11/20 1415  WBC 19.1*  NEUTROABS 16.9*  HGB 10.4*  HCT 33.6*  MCV 91.6  PLT 025*   Basic Metabolic Panel: Recent Labs  Lab 06/11/20 1415  NA 142  K 3.2*  CL 104  CO2 23  GLUCOSE 147*  BUN 17  CREATININE 1.31*  CALCIUM 8.6*   GFR: CrCl cannot be calculated (Unknown ideal weight.). Liver Function Tests: Recent Labs  Lab 06/11/20 1415  AST 13*  ALT 17  ALKPHOS 83  BILITOT 0.6  PROT 7.5  ALBUMIN 3.0*   No results for input(s): LIPASE, AMYLASE in the last 168 hours. No results for input(s): AMMONIA in the last 168 hours. Coagulation Profile: No results for input(s): INR, PROTIME in the last 168 hours. Cardiac Enzymes: No results for input(s): CKTOTAL, CKMB, CKMBINDEX, TROPONINI in the last 168 hours. BNP (last 3 results) No results for input(s): PROBNP in the last 8760 hours. HbA1C: No results for input(s): HGBA1C in the last 72 hours. CBG: No results for input(s): GLUCAP in the last 168 hours. Lipid Profile: No results for input(s): CHOL, HDL, LDLCALC, TRIG, CHOLHDL, LDLDIRECT in the last 72 hours. Thyroid Function Tests: No results for input(s): TSH, T4TOTAL, FREET4, T3FREE, THYROIDAB in the last 72 hours. Anemia Panel: No results for input(s): VITAMINB12, FOLATE, FERRITIN, TIBC, IRON, RETICCTPCT in the last 72 hours. Urine analysis:    Component Value Date/Time   COLORURINE YELLOW 06/11/2020 1415   APPEARANCEUR HAZY (A) 06/11/2020 1415   LABSPEC 1.018 06/11/2020 1415   PHURINE 5.0 06/11/2020 1415   GLUCOSEU NEGATIVE 06/11/2020 1415   HGBUR MODERATE (A) 06/11/2020  1415   BILIRUBINUR NEGATIVE 06/11/2020 1415   KETONESUR 5 (A) 06/11/2020 1415   PROTEINUR 30 (A) 06/11/2020 1415   UROBILINOGEN 1.0 04/16/2012 1950   NITRITE POSITIVE (A) 06/11/2020 1415   LEUKOCYTESUR LARGE (A) 06/11/2020 1415    Radiological Exams on Admission: No results found.  EKG: Independently reviewed. No EKG done in ED. Last on file 2013 - not accessible  Assessment/Plan Active Problems:   HTN (hypertension)   Acute lower UTI   Gout attack   Weakness   Acute prerenal azotemia   DM2 (diabetes mellitus, type 2) (HCC)   HLD (hyperlipidemia)   UTI (urinary tract infection)  (please populate well all problems here in Problem List. (For example, if patient is on BP meds at home and you resume or decide to hold them, it is a problem that needs to be her. Same for CAD, COPD, HLD and so on)   1. UTI - patient with positive UA, fever, leukocytosis. Plan Continue Rocephin for one more dose 11/16 then convert to oral abx  2. Prerenal Azotemia - mild elevation in creatinine in patient with poor PO intake for several days. Plan IVF @ 75 cc/hr  F/u Bmet in AM  3. HTN- elevated BP in ED but asymptomatic. Has not had anymeds today. Plan Resume home regimen with dose given this PM  4. Gout - no evidence on exam of acute gout flare. Plan Uric acid level  Continue allopurinol 100 mg bid  5. OA - suspect acute joint pain more OA than gout. Plan APAP 1g tid   NSAIDs prn  6. DM2 - mild elevation in glucose Plan A1C  Sliding scale  Low carb diet  7. HLD - continue home med. Lipid panel in AM  8. Acute weakness - no focal weakness on exam. Suspect mobility affected by OA and gout pain Plan See above  PT/OT eval  Family may stay with patient due to mobility issues and fall risk  9. EoL care - referred patient to [facility] to begin the planning process. Full code at this time.    DVT prophylaxis: lovonox  Code Status: full code Family Communication:  daughter present for interview and exam. Understands Dx and Tx plan  Disposition Plan: home when stable 24-48 hrs  Consults called: none (with names) Admission status: obs  Adella Hare MD [facility] Pager [phone]  If 7PM-7AM, please contact night-coverage [email or web address] Password [facility]  06/11/2020, 7:26 PM

## 2020-06-11 NOTE — ED Notes (Signed)
Family is talking to PA and said pt cannot walk at all.  PA said the pt does not need to be ambulated.  Family said when pt does walk he needs crutches.  It took 3 staff members to pull pt from the wheel chair into the bed because pt could not bear weight on legs.

## 2020-06-11 NOTE — ED Notes (Signed)
Lab notified to add on urine culture 

## 2020-06-11 NOTE — TOC Initial Note (Signed)
Transition of Care [facility]) - Initial/Assessment Note    Patient Details  Name: Eduardo Moon MRN: [id number] Date of Birth: 11-02-1945  [facility] of [facility]) CM/SW Contact:    Erenest Rasher, RN Phone Number: [phone] 06/11/2020, 7:12 PM  Clinical Narrative:                 TOC CM spoke to dtr, Eduardo. Pt lives in home with dtr. States she is able to care for pt 24 hours. She is agreeable to [facility].  Pt has DME at home. Has wheelchair in home.   Expected Discharge Plan: [place] Barriers to Discharge: Continued Medical Work up   Patient Goals and CMS Choice Patient states their goals for this hospitalization and ongoing recovery are:: daughter prefers patient go home CMS Medicare.gov Compare Post Acute Care list provided to:: Patient Represenative (must comment) (Eduardo Moon-dtr) Choice offered to / list presented to : Adult Children  Expected Discharge Plan and Services Expected Discharge Plan: [place] In-house Referral: Clinical Social Work Discharge Planning Services: CM Consult Post Acute Care Choice: [place] arrangements for the past 2 months: [place]                                      Prior Living Arrangements/Services Living arrangements for the past 2 months: Single Family Home Lives with:: Adult Children Patient language and need for interpreter reviewed:: Yes Do you feel safe going back to the place where you live?: Yes      Need for Family Participation in Patient Care: Yes (Comment) Care giver support system in place?: Yes (comment) Current home services: DME (wheelchair, crutches, elevated toilet seat) Criminal Activity/Legal Involvement Pertinent to Current Situation/Hospitalization: No - Comment as needed  Activities of Daily Living      Permission Sought/Granted Permission sought to share information with : Case Manager, PCP, Family Supports Permission granted to share  information with : Yes, Verbal Permission Granted  Share Information with NAME: Eduardo Moon  Permission granted to share info w AGENCY: [place] granted to share info w Relationship: daughgter  Permission granted to share info w Contact Information: [phone]  Emotional Assessment           Psych Involvement: No (comment)  Admission diagnosis:  UTI (urinary tract infection) [N39.0] Patient Active Problem List   Diagnosis Date Noted  . HTN (hypertension) 06/11/2020  . Gout attack 06/11/2020  . Acute lower UTI 06/11/2020  . Weakness 06/11/2020  . UTI (urinary tract infection) 06/11/2020  . Epididymo-orchitis, acute 05/07/2012  . Scrotal abscess 05/07/2012   PCP:  Patient, No Pcp Per Pharmacy:   [id number] - [place], [place] [address] [place] [address] Phone: [phone] Fax: [phone]     Social Determinants of Health (SDOH) Interventions    Readmission Risk Interventions No flowsheet data found.

## 2020-06-11 NOTE — ED Triage Notes (Signed)
Pt arrived via EMS, from home, generalized weakness x3 weeks, progressively worsening. Was using the bathroom this morning, was unable to get up by self.    197/92 BP 88HR 18RR CBG 174 99% RA

## 2020-06-12 DIAGNOSIS — B91 Sequelae of poliomyelitis: Secondary | ICD-10-CM | POA: Diagnosis not present

## 2020-06-12 DIAGNOSIS — E876 Hypokalemia: Secondary | ICD-10-CM | POA: Diagnosis present

## 2020-06-12 DIAGNOSIS — Z7984 Long term (current) use of oral hypoglycemic drugs: Secondary | ICD-10-CM | POA: Diagnosis not present

## 2020-06-12 DIAGNOSIS — Z20822 Contact with and (suspected) exposure to covid-19: Secondary | ICD-10-CM | POA: Diagnosis present

## 2020-06-12 DIAGNOSIS — G8194 Hemiplegia, unspecified affecting left nondominant side: Secondary | ICD-10-CM | POA: Diagnosis present

## 2020-06-12 DIAGNOSIS — Z79899 Other long term (current) drug therapy: Secondary | ICD-10-CM | POA: Diagnosis not present

## 2020-06-12 DIAGNOSIS — N3000 Acute cystitis without hematuria: Secondary | ICD-10-CM | POA: Diagnosis not present

## 2020-06-12 DIAGNOSIS — E785 Hyperlipidemia, unspecified: Secondary | ICD-10-CM | POA: Diagnosis present

## 2020-06-12 DIAGNOSIS — I10 Essential (primary) hypertension: Secondary | ICD-10-CM

## 2020-06-12 DIAGNOSIS — M109 Gout, unspecified: Secondary | ICD-10-CM | POA: Diagnosis present

## 2020-06-12 DIAGNOSIS — N390 Urinary tract infection, site not specified: Secondary | ICD-10-CM | POA: Diagnosis present

## 2020-06-12 DIAGNOSIS — N19 Unspecified kidney failure: Secondary | ICD-10-CM | POA: Diagnosis not present

## 2020-06-12 DIAGNOSIS — E119 Type 2 diabetes mellitus without complications: Secondary | ICD-10-CM | POA: Diagnosis present

## 2020-06-12 DIAGNOSIS — E860 Dehydration: Secondary | ICD-10-CM | POA: Diagnosis present

## 2020-06-12 DIAGNOSIS — D638 Anemia in other chronic diseases classified elsewhere: Secondary | ICD-10-CM | POA: Diagnosis present

## 2020-06-12 DIAGNOSIS — Z8546 Personal history of malignant neoplasm of prostate: Secondary | ICD-10-CM | POA: Diagnosis not present

## 2020-06-12 DIAGNOSIS — N179 Acute kidney failure, unspecified: Secondary | ICD-10-CM | POA: Diagnosis present

## 2020-06-12 DIAGNOSIS — B9689 Other specified bacterial agents as the cause of diseases classified elsewhere: Secondary | ICD-10-CM | POA: Diagnosis present

## 2020-06-12 LAB — LIPID PANEL
Cholesterol: 97 mg/dL (ref 0–200)
HDL: 26 mg/dL — ABNORMAL LOW (ref >40)
LDL Cholesterol: 54 mg/dL (ref 0–99)
Total CHOL/HDL Ratio: 3.7 RATIO
Triglycerides: 87 mg/dL (ref <150)
VLDL: 17 mg/dL (ref 0–40)

## 2020-06-12 LAB — GLUCOSE, CAPILLARY
Glucose-Capillary: 92 mg/dL (ref 70–99)
Glucose-Capillary: 124 mg/dL — ABNORMAL HIGH (ref 70–99)
Glucose-Capillary: 136 mg/dL — ABNORMAL HIGH (ref 70–99)
Glucose-Capillary: 123 mg/dL — ABNORMAL HIGH (ref 70–99)

## 2020-06-12 LAB — CBC
HCT: 26.5 % — ABNORMAL LOW (ref 39.0–52.0)
Hemoglobin: 8.4 g/dL — ABNORMAL LOW (ref 13.0–17.0)
MCH: 29 pg (ref 26.0–34.0)
MCHC: 31.7 g/dL (ref 30.0–36.0)
MCV: 91.4 fL (ref 80.0–100.0)
Platelets: 430 10*3/uL — ABNORMAL HIGH (ref 150–400)
RBC: 2.9 MIL/uL — ABNORMAL LOW (ref 4.22–5.81)
RDW: 14.5 % (ref 11.5–15.5)
WBC: 11.2 10*3/uL — ABNORMAL HIGH (ref 4.0–10.5)
nRBC: 0 % (ref 0.0–0.2)

## 2020-06-12 LAB — BASIC METABOLIC PANEL
Anion gap: 11 (ref 5–15)
BUN: 16 mg/dL (ref 8–23)
CO2: 22 mmol/L (ref 22–32)
Calcium: 7.9 mg/dL — ABNORMAL LOW (ref 8.9–10.3)
Chloride: 106 mmol/L (ref 98–111)
Creatinine, Ser: 1.19 mg/dL (ref 0.61–1.24)
GFR, Estimated: >60 mL/min (ref >60)
Glucose, Bld: 107 mg/dL — ABNORMAL HIGH (ref 70–99)
Potassium: 2.9 mmol/L — ABNORMAL LOW (ref 3.5–5.1)
Sodium: 139 mmol/L (ref 135–145)

## 2020-06-12 LAB — URIC ACID: Uric Acid, Serum: 6.2 mg/dL (ref 3.7–8.6)

## 2020-06-12 LAB — URINE CULTURE: Culture: >=100000 — AB

## 2020-06-12 LAB — MAGNESIUM: Magnesium: 1.4 mg/dL — ABNORMAL LOW (ref 1.7–2.4)

## 2020-06-12 MED ORDER — MAGNESIUM SULFATE 2 GM/50ML IV SOLN
2.0000 g | Freq: Once | INTRAVENOUS | Status: AC
  Administered 2020-06-12: 2 g via INTRAVENOUS
  Filled 2020-06-12: qty 50

## 2020-06-12 MED ORDER — POTASSIUM CHLORIDE CRYS ER 20 MEQ PO TBCR
40.0000 meq | EXTENDED_RELEASE_TABLET | ORAL | Status: AC
  Administered 2020-06-12 (×2): 40 meq via ORAL
  Filled 2020-06-12: qty 2

## 2020-06-12 MED ORDER — COLCHICINE 0.6 MG PO TABS
0.6000 mg | ORAL_TABLET | Freq: Every day | ORAL | Status: DC
  Administered 2020-06-12 – 2020-06-14 (×3): 0.6 mg via ORAL
  Filled 2020-06-12 (×3): qty 1

## 2020-06-12 NOTE — Plan of Care (Signed)
  Problem: Clinical Measurements: Goal: Cardiovascular complication will be avoided Outcome: Progressing   Problem: Activity: Goal: Risk for activity intolerance will decrease Outcome: Progressing   Problem: Pain Managment: Goal: General experience of comfort will improve Outcome: Progressing   Problem: Skin Integrity: Goal: Risk for impaired skin integrity will decrease Outcome: Progressing   Problem: Safety: Goal: Ability to remain free from injury will improve Outcome: Progressing

## 2020-06-12 NOTE — Progress Notes (Signed)
Chaplain visited patient in response to spiritual consult for AD.  Patient has two daughters and is divorced.  The daughter that he lives with, Eduardo Moon, was present by phone held by her sister, Eduardo Moon, who was bedside. Chaplain had a three way meeting with them on Advanced Directives.  Patient desires his daughter Eduardo Moon to make his health care decisions in the event he cannot express his wishes. Chaplain walked through the process of completing AD with them and they plan to do it upon his discharge.   Rev. Tamsen Snider Pager [phone]

## 2020-06-12 NOTE — Evaluation (Signed)
Physical Therapy Evaluation Patient Details Name: Eduardo Moon MRN: [id number] DOB: 10-06-1945 Today's Date: 06/12/2020   History of Present Illness  74 year old male with HTN, history of polio with residual left hemiparesis using a brace for the left leg, history of gout, presents to the hospital with indolent progressive weakness over the last 3 weeks.  Pt found to have UTI and hypokalemia  Clinical Impression  Pt admitted with above diagnosis. Pt currently with functional limitations due to the deficits listed below (see PT Problem List). Pt will benefit from skilled PT to increase their independence and safety with mobility to allow discharge to the venue listed below.  [name] present for session and reports pt lives with another daughter.  Pt typically with limited endurance at baseline however was able to perform ambulation and ADLs modified independently.  Pt requiring at least mod assist to ambulate a few feet today.  Recommended pt use RW instead of crutches at this time for more stability and safety.  Daughter pt lives with would like pt to return home and sounds agreeable to HHPT.  Also recommend hospital bed and BSC.     Follow Up Recommendations Home health PT;Supervision for mobility/OOB    Equipment Recommendations  Hospital bed;3in1 (PT)    Recommendations for Other Services       Precautions / Restrictions Precautions Precautions: Fall Required Braces or Orthoses: Other Brace Other Brace: brace for L LE locks into extension for mobility Restrictions Weight Bearing Restrictions: No      Mobility  Bed Mobility Overal bed mobility: Needs Assistance Bed Mobility: Supine to Sit;Sit to Supine     Supine to sit: Min assist Sit to supine: Mod assist   General bed mobility comments: assist for trunk upright and LEs onto bed    Transfers Overall transfer level: Needs assistance Equipment used: Crutches Transfers: Sit to/from Stand Sit to Stand: Mod  assist;Max assist;+2 physical assistance;From elevated surface         General transfer comment: pt attemtped to perform with crutches as he does at home however needing increased assist to rise and steady; typically braces himself posteriorly with sit to stands per daughter; discussed using RW for improved safety and stability next visit; daughter present and assisted for safety; also reports pt much weaker then normal  Ambulation/Gait Ambulation/Gait assistance: Min assist;+2 safety/equipment;Mod assist Gait Distance (Feet): 10 Feet Assistive device: Crutches Gait Pattern/deviations: Step-to pattern;Decreased stance time - right     General Gait Details: pt with pain in right foot, at least min assist for stability and weakness; required more assist upon steps back to bed due to fatigue; daughter also assisting  Stairs            Wheelchair Mobility    Modified Rankin (Stroke Patients Only)       Balance Overall balance assessment: Needs assistance         Standing balance support: Bilateral upper extremity supported Standing balance-Leahy Scale: Zero                               Pertinent Vitals/Pain Pain Assessment: Faces Faces Pain Scale: Hurts even more Pain Location: right foot with donning shoe (gout) Pain Descriptors / Indicators: Grimacing Pain Intervention(s): Repositioned;Limited activity within patient's tolerance    Home Living Family/patient expects to be discharged to:: Private residence Living Arrangements: Children Available Help at Discharge: Family;Available 24 hours/day Type of Home: House Home Access: Stairs to enter  Entrance Stairs-Number of Steps: 3 Home Layout: One level Home Equipment: Crutches;Wheelchair - manual      Prior Function Level of Independence: Independent with assistive device(s)         Comments: pt typically able to perform household ambulation with crutches; able to bath/dress himself; however,  pt has been mostly in bed the past 3 weeks due to gout pain     Hand Dominance        Extremity/Trunk Assessment        Lower Extremity Assessment Lower Extremity Assessment: Generalized weakness;LLE deficits/detail LLE Deficits / Details: hx of deficits due to polio, has brace that locks in extension       Communication   Communication: No difficulties  Cognition Arousal/Alertness: Awake/alert Behavior During Therapy: WFL for tasks assessed/performed Overall Cognitive Status: Within Functional Limits for tasks assessed                                        General Comments      Exercises     Assessment/Plan    PT Assessment Patient needs continued PT services  PT Problem List Decreased strength;Decreased mobility;Decreased activity tolerance;Decreased balance;Decreased knowledge of use of DME;Pain       PT Treatment Interventions DME instruction;Therapeutic exercise;Gait training;Balance training;Functional mobility training;Therapeutic activities;Patient/family education;Wheelchair mobility training    PT Goals (Current goals can be found in the Care Plan section)  Acute Rehab PT Goals PT Goal Formulation: With patient/family Time For Goal Achievement: 06/26/20 Potential to Achieve Goals: Good    Frequency Min 3X/week   Barriers to discharge        Co-evaluation               AM-PAC PT "6 Clicks" Mobility  Outcome Measure Help needed turning from your back to your side while in a flat bed without using bedrails?: A Lot Help needed moving from lying on your back to sitting on the side of a flat bed without using bedrails?: A Lot Help needed moving to and from a bed to a chair (including a wheelchair)?: A Lot Help needed standing up from a chair using your arms (e.g., wheelchair or bedside chair)?: A Lot Help needed to walk in hospital room?: A Lot Help needed climbing 3-5 steps with a railing? : Total 6 Click Score: 11    End  of Session Equipment Utilized During Treatment: Gait belt Activity Tolerance: Patient limited by fatigue Patient left: in bed;with call bell/phone within reach;with bed alarm set;with family/visitor present   PT Visit Diagnosis: Muscle weakness (generalized) (M62.81);Difficulty in walking, not elsewhere classified (R26.2)    Time: 2831-5176 PT Time Calculation (min) (ACUTE ONLY): 14 min   Charges:   PT Evaluation $PT Eval Moderate Complexity: 1 Mod     Kati PT, DPT Acute Rehabilitation Services Pager: [phone] Office: [phone]  York Ram E 06/12/2020, 12:11 PM

## 2020-06-12 NOTE — Progress Notes (Signed)
Occupational Therapy Evaluation  Patient lives with DTR in single level home with 3 STE. At baseline patient utilizes crutches for ambulation and L LE brace due to hx polio and is mod I with BADLs. Currently patient is set up for UB ADL and mod to total A for LB AD/ functional mobility. Attempted to stand twice from EOB with rolling walker with cues for body mechanics however due to weakness, decreased activity tolerance patient unable to elevate buttock from EOB with max A. Patient/DTR report patient has had limited mobility past ~3 weeks due to gout pain. Pending patient progress with mobility and family ability to assist with transfers recommend [facility] vs SNF. Acute OT will continue to follow.    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  Pt found to have UTI and hypokalemia  Precautions  Precautions Fall  Required Braces or Orthoses Other Brace  Other Brace brace for L LE locks into extension for mobility  Home Living  Family/patient expects to be discharged to: Private residence  Living Arrangements Children  Available Help at Discharge Family;Available 24 hours/day  Type of [place] to enter  Entrance Stairs-Number of Steps 3  Home Layout One level  Bathroom Shower/Tub Tub/shower unit  Loss adjuster, chartered;Wheelchair - manual;BSC  Prior Function  Level of Independence Independent with assistive device(s)  Comments pt typically able to perform household ambulation with crutches; able to bath/dress himself; however, pt has been mostly in bed the past 3 weeks due to gout pain  Communication  Communication No difficulties  Pain Assessment  Pain Assessment Faces   Faces Pain Scale 4  Pain Location R ankle (gout)  Pain Descriptors / Indicators Grimacing  Pain Intervention(s) Monitored during session  Cognition  Arousal/Alertness Awake/alert  Behavior During Therapy WFL for tasks assessed/performed  Overall Cognitive Status Within Functional Limits for tasks assessed  Upper Extremity Assessment  Upper Extremity Assessment Generalized weakness  Lower Extremity Assessment  Lower Extremity Assessment Defer to PT evaluation  ADL  Overall ADL's  Needs assistance/impaired  Grooming Set up;Sitting  Upper Body Bathing Set up;Sitting  Lower Body Bathing Moderate assistance;Sitting/lateral leans;Sit to/from stand  Upper Body Dressing  Sitting;Set up  Lower Body Dressing Moderate assistance;Sitting/lateral leans  Lower Body Dressing Details (indicate cue type and reason) to don R shoe, patient reports typically dresses from bed level  Toilet Transfer Details (indicate cue type and reason) unable attempted to stand twice with max A x1 however patient unable to lift buttock from EOB  Toileting- Clothing Manipulation and Hygiene Total assistance  General ADL Comments patient requiring increased assistance with self care due to decreased activity tolerance, balance, strength and gout flare  Bed Mobility  Overal bed mobility Needs Assistance  Bed Mobility Supine to Sit;Sit to Supine  Supine to sit Min assist;HOB elevated  Sit to supine Mod assist  General bed mobility comments min A for L LE to EOB and heavy use of bed rail, mod A to lift LEs back into bed   Transfers  Overall transfer level Needs assistance  General transfer comment attempted twice to stand from EOB with rolling walker, cues for body mechanics and max A x1 from OT  however patient unable to elevate buttock off EOB  Balance  Overall balance assessment Needs assistance  Sitting-balance support Feet supported  Sitting balance-Leahy Scale Good  Exercises  Exercises General Upper  Extremity;General Lower Extremity;Other exercises  General Exercises - Upper Extremity  Shoulder Horizontal ABduction Strengthening;Both;10 reps;Supine;Theraband  Elbow Flexion Strengthening;10 reps;Supine;Theraband  Elbow Extension Strengthening;Both;10 reps;Supine;Theraband  Theraband Level (Shoulder Horizontal Abduction) Level 3 (Green)  Theraband Level (Elbow Flexion) Level 3 (Green)  Theraband Level (Elbow Extension) Level 3 (Green)  General Exercises - Lower Extremity  Ankle Circles/Pumps AROM;10 reps;Right;Supine  Quad Sets Strengthening;5 reps;Right;Supine  Gluteal Sets Strengthening;Right;5 reps;Supine  Other Exercises  Other Exercises x10 with green theraband chest press   OT - End of Session  Equipment Utilized During Treatment Rolling walker;Gait belt  Activity Tolerance Patient tolerated treatment well  Patient left in bed;with call bell/phone within reach;with bed alarm set;with family/visitor present  Nurse Communication Mobility status  OT Assessment  OT Recommendation/Assessment Patient needs continued OT Services  OT Visit Diagnosis Other abnormalities of gait and mobility (R26.89);Muscle weakness (generalized) (M62.81);Pain  Pain - Right/Left Right  Pain - part of body Ankle and joints of foot  OT Problem List Decreased strength;Decreased activity tolerance;Impaired balance (sitting and/or standing);Pain  OT Plan  OT Frequency (ACUTE ONLY) Min 2X/week  OT Treatment/Interventions (ACUTE ONLY) Self-care/ADL training;Therapeutic exercise;DME and/or AE instruction;Therapeutic activities;Patient/family education;Balance training  AM-PAC OT "6 Clicks" Daily Activity Outcome Measure (Version 2)  Help from another person eating meals? 4  Help from another person taking care of personal grooming? 3  Help from another person toileting, which includes using toliet, bedpan, or urinal? 1  Help from another person bathing (including washing, rinsing, drying)? 2  Help from  another person to put on and taking off regular upper body clothing? 3  Help from another person to put on and taking off regular lower body clothing? 2  6 Click Score 15  OT Recommendation  Follow Up Recommendations Home health OT;Supervision/Assistance - 24 hour;Other (comment) (vs SNF pending progress with transfers/family ability to A)  [facility] bed  Individuals Consulted  Consulted and Agree with Results and Recommendations Patient  Acute Rehab OT Goals  Patient Stated Goal get better  OT Goal Formulation With patient  Time For Goal Achievement 06/26/20  Potential to Achieve Goals Good  OT Time Calculation  OT Start Time (ACUTE ONLY) 1324  OT Stop Time (ACUTE ONLY) 1402  OT Time Calculation (min) 38 min  OT General Charges  $OT Visit 1 Visit  OT Evaluation  $OT Eval Low Complexity 1 Low  OT Treatments  $Self Care/Home Management  8-22 mins  $Therapeutic Exercise 8-22 mins  Written Expression  Dominant Hand Right   Delbert Phenix OT OT pager: [phone]

## 2020-06-12 NOTE — Progress Notes (Addendum)
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  Daughter is at bedside and tells me that he is also been having slightly decreased appetite.  He denies any abdominal pain, denies any nausea or vomiting.  In the ED he was found to have a UTI with significant leukocytosis with a white count of 19.1.  He was placed on IV ceftriaxone and admitted to the hospital  Subjective / 24h Interval events: Still feeling overall weak this morning, improved compared to yesterday but not at baseline.  Has not attempted to ambulate yet  Assessment & Plan: Principal Problem Urinary tract infection, weakness -Patient admitted with low-grade temp of 99, significantly elevated WBC as well as a urinalysis which shows evidence of a UTI.  He was started on ceftriaxone, continue, monitor urine cultures -Has persistent weakness this morning, continue intravenous antibiotics -He was dehydrated on admission with mild elevation of creatinine which is now improved after IV fluids.  Active Problems Profound hypokalemia -Probably contributing to his weakness as well, possibly due to poor p.o. intake, replete aggressively this morning his potassium is 2.9.  Recheck potassium tomorrow morning -Add on magnesium level as well, replete magnesium IV  Gout -Feels like he is having a gouty flare in his right foot, has difficulties ambulating on that the right foot, start colchicine, continue home allopurinol  Hyperlipidemia -Continue statin  Essential hypertension -Continue lisinopril, metoprolol, blood pressure stable this morning  Type 2 diabetes mellitus -Continue sliding scale  CBG (last 3)  Recent Labs     06/11/20 2259 06/12/20 0721  GLUCAP 137* 92    Scheduled Meds: . acetaminophen  1,000 mg Oral TID   Or  . acetaminophen  650 mg Rectal TID  . allopurinol  100 mg Oral BID  . colchicine  0.6 mg Oral Daily  . enoxaparin (LOVENOX) injection  40 mg Subcutaneous QHS  . insulin aspart  0-15 Units Subcutaneous TID WC  . lisinopril  30 mg Oral Daily  . metoprolol succinate  100 mg Oral Daily  . pantoprazole  40 mg Oral Daily  . potassium chloride SA  20 mEq Oral Daily  . potassium chloride  40 mEq Oral Q2H  . rosuvastatin  5 mg Oral Daily  . senna  1 tablet Oral BID  . traZODone  25 mg Oral QHS   Continuous Infusions: . cefTRIAXone (ROCEPHIN)  IV     PRN Meds:.diazepam, HYDROcodone-acetaminophen, ibuprofen  Diet Orders (From admission, onward)    Start     Ordered   06/11/20 1833  Diet heart healthy/carb modified Room service appropriate? Yes; Fluid consistency: Thin  Diet effective now       Question Answer Comment  Diet-HS Snack? Nothing   Room service appropriate? Yes   Fluid consistency: Thin      06/11/20 1834          DVT prophylaxis: enoxaparin (LOVENOX) injection 40 mg Start: 06/11/20 2200     Code Status: Full Code  Family Communication: Daughter present at bedside  Status is: Observation  The patient will require care spanning > 2 midnights and should be moved to inpatient because: Persistent severe electrolyte disturbances and Inpatient level of care appropriate due to  severity of illness  Dispo: The patient is from: Home              Anticipated d/c is to: Home              Anticipated d/c date is: 1 day              Patient currently is not medically stable to d/c.  Consultants:  None  Procedures:  None   Microbiology  Urine cultures - pending  Antimicrobials: Ceftriaxone 11/15 >>    Objective: Vitals:   06/11/20 2229 06/12/20 0235 06/12/20 0647 06/12/20 0940  BP: (!) 166/83 (!) 145/71 (!) 156/74 (!) 149/68  Pulse: 93 77 86 87  Resp: 18  16 17 16   Temp: 98.2 F (36.8 C) 98.2 F (36.8 C) 97.8 F (36.6 C) 99.8 F (37.7 C)  TempSrc: Oral Oral Oral Oral  SpO2: 100% 97% 98% 98%  Weight:      Height:        Intake/Output Summary (Last 24 hours) at 06/12/2020 1000 Last data filed at 06/12/2020 1062 Gross per 24 hour  Intake 1198.84 ml  Output 126 ml  Net 1072.84 ml   Filed Weights   06/11/20 2126  Weight: 65.8 kg    Examination:  Constitutional: NAD Eyes: no scleral icterus ENMT: Mucous membranes are moist.  Neck: normal, supple Respiratory: clear to auscultation bilaterally, no wheezing, no crackles Cardiovascular: Regular rate and rhythm, no murmurs / rubs / gallops. No LE edema. Good peripheral pulses Abdomen: non distended, no tenderness. Bowel sounds positive.  Musculoskeletal: no clubbing / cyanosis.  Skin: no rashes Neurologic: Grossly nonfocal  Data Reviewed: I have independently reviewed following labs and imaging studies   CBC: Recent Labs  Lab 06/11/20 1415 06/12/20 0806  WBC 19.1* 11.2*  NEUTROABS 16.9*  --   HGB 10.4* 8.4*  HCT 33.6* 26.5*  MCV 91.6 91.4  PLT 656* 694*   Basic Metabolic Panel: Recent Labs  Lab 06/11/20 1415 06/12/20 0334  NA 142 139  K 3.2* 2.9*  CL 104 106  CO2 23 22  GLUCOSE 147* 107*  BUN 17 16  CREATININE 1.31* 1.19  CALCIUM 8.6* 7.9*   Liver Function Tests: Recent Labs  Lab 06/11/20 1415  AST 13*  ALT 17  ALKPHOS 83  BILITOT 0.6  PROT 7.5  ALBUMIN 3.0*   Coagulation Profile: No results for input(s): INR, PROTIME in the last 168 hours. HbA1C: Recent Labs    06/11/20 1415  HGBA1C 5.4   CBG: Recent Labs  Lab 06/11/20 2259 06/12/20 0721  GLUCAP 137* 92    Recent Results (from the past 240 hour(s))  Respiratory Panel by RT PCR (Flu A&B, Covid) - Nasopharyngeal Swab     Status: None   Collection Time: 06/11/20  4:32 PM   Specimen: Nasopharyngeal Swab  Result Value Ref Range Status   SARS Coronavirus 2 by RT PCR NEGATIVE NEGATIVE  Final    Comment: (NOTE) SARS-CoV-2 target nucleic acids are NOT DETECTED.  The SARS-CoV-2 RNA is generally detectable in upper respiratoy specimens during the acute phase of infection. The lowest concentration of SARS-CoV-2 viral copies this assay can detect is 131 copies/mL. A negative result does not preclude SARS-Cov-2 infection and should not be used as the sole basis for treatment or other patient management decisions. A negative result may occur with  improper specimen collection/handling, submission of specimen other than nasopharyngeal swab, presence of viral mutation(s) within the areas targeted by this assay, and  inadequate number of viral copies (<131 copies/mL). A negative result must be combined with clinical observations, patient history, and epidemiological information. The expected result is Negative.  Fact Sheet for Patients:  PinkCheek.be  Fact Sheet for Healthcare Providers:  GravelBags.it  This test is no t yet approved or cleared by the [place] FDA and  has been authorized for detection and/or diagnosis of SARS-CoV-2 by FDA under an Emergency Use Authorization (EUA). This EUA will remain  in effect (meaning this test can be used) for the duration of the COVID-19 declaration under Section 564(b)(1) of the Act, 21 U.S.C. section 360bbb-3(b)(1), unless the authorization is terminated or revoked sooner.     Influenza A by PCR NEGATIVE NEGATIVE Final   Influenza B by PCR NEGATIVE NEGATIVE Final    Comment: (NOTE) The Xpert Xpress SARS-CoV-2/FLU/RSV assay is intended as an aid in  the diagnosis of influenza from Nasopharyngeal swab specimens and  should not be used as a sole basis for treatment. Nasal washings and  aspirates are unacceptable for Xpert Xpress SARS-CoV-2/FLU/RSV  testing.  Fact Sheet for Patients: PinkCheek.be  Fact Sheet for Healthcare  Providers: GravelBags.it  This test is not yet approved or cleared by the [place] FDA and  has been authorized for detection and/or diagnosis of SARS-CoV-2 by  FDA under an Emergency Use Authorization (EUA). This EUA will remain  in effect (meaning this test can be used) for the duration of the  Covid-19 declaration under Section 564(b)(1) of the Act, 21  U.S.C. section 360bbb-3(b)(1), unless the authorization is  terminated or revoked. Performed at [facility], [place] [address], [place], [place] [address]      Radiology Studies: No results found.  Marzetta Board, MD, PhD [facility]  Between 7 am - 7 pm I am available, please contact me via Amion or Securechat  Between 7 pm - 7 am I am not available, please contact night coverage MD/APP via Amion

## 2020-06-12 NOTE — Progress Notes (Deleted)
Chaplain rec'd spiritual consult for EOL. Patient was uncovered when she entered room, and groaning. When asked if he would like a visit, he declined. Chaplain will refer patient to staff chaplain for a later time.  Rev. Tamsen Snider Pager [phone]

## 2020-06-13 DIAGNOSIS — M109 Gout, unspecified: Secondary | ICD-10-CM | POA: Diagnosis not present

## 2020-06-13 DIAGNOSIS — E119 Type 2 diabetes mellitus without complications: Secondary | ICD-10-CM | POA: Diagnosis not present

## 2020-06-13 DIAGNOSIS — N19 Unspecified kidney failure: Secondary | ICD-10-CM | POA: Diagnosis not present

## 2020-06-13 DIAGNOSIS — N3000 Acute cystitis without hematuria: Secondary | ICD-10-CM | POA: Diagnosis not present

## 2020-06-13 LAB — CBC
HCT: 26 % — ABNORMAL LOW (ref 39.0–52.0)
Hemoglobin: 8.3 g/dL — ABNORMAL LOW (ref 13.0–17.0)
MCH: 29.5 pg (ref 26.0–34.0)
MCHC: 31.9 g/dL (ref 30.0–36.0)
MCV: 92.5 fL (ref 80.0–100.0)
Platelets: 407 10*3/uL — ABNORMAL HIGH (ref 150–400)
RBC: 2.81 MIL/uL — ABNORMAL LOW (ref 4.22–5.81)
RDW: 14.3 % (ref 11.5–15.5)
WBC: 9.5 10*3/uL (ref 4.0–10.5)
nRBC: 0 % (ref 0.0–0.2)

## 2020-06-13 LAB — GLUCOSE, CAPILLARY
Glucose-Capillary: 93 mg/dL (ref 70–99)
Glucose-Capillary: 116 mg/dL — ABNORMAL HIGH (ref 70–99)
Glucose-Capillary: 114 mg/dL — ABNORMAL HIGH (ref 70–99)
Glucose-Capillary: 125 mg/dL — ABNORMAL HIGH (ref 70–99)

## 2020-06-13 LAB — BASIC METABOLIC PANEL
Anion gap: 10 (ref 5–15)
BUN: 14 mg/dL (ref 8–23)
CO2: 21 mmol/L — ABNORMAL LOW (ref 22–32)
Calcium: 8 mg/dL — ABNORMAL LOW (ref 8.9–10.3)
Chloride: 107 mmol/L (ref 98–111)
Creatinine, Ser: 1.25 mg/dL — ABNORMAL HIGH (ref 0.61–1.24)
GFR, Estimated: 60 mL/min — ABNORMAL LOW (ref >60)
Glucose, Bld: 117 mg/dL — ABNORMAL HIGH (ref 70–99)
Potassium: 3.5 mmol/L (ref 3.5–5.1)
Sodium: 138 mmol/L (ref 135–145)

## 2020-06-13 LAB — URINE CULTURE

## 2020-06-13 LAB — MAGNESIUM: Magnesium: 1.9 mg/dL (ref 1.7–2.4)

## 2020-06-13 MED ORDER — SODIUM CHLORIDE 0.9 % IV SOLN: INTRAVENOUS | Status: DC

## 2020-06-13 NOTE — Plan of Care (Signed)
  Problem: Education: Goal: Knowledge of General Education information will improve Description: Including pain rating scale, medication(s)/side effects and non-pharmacologic comfort measures Outcome: Progressing   Problem: Clinical Measurements: Goal: Will remain free from infection Outcome: Progressing   Problem: Clinical Measurements: Goal: Diagnostic test results will improve Outcome: Progressing   

## 2020-06-13 NOTE — Progress Notes (Signed)
PROGRESS NOTE    Eduardo Moon  IRS:[id number] DOB: [birth year] DOA: 06/11/2020 PCP: Patient, No Pcp Per     No chief complaint on file.   Brief Narrative:   74 year old gentleman with prior history of hypertension, history of polio, with residual left hemiparesis uses a brace for the left leg, gout presents with progressive weakness over the last 3 weeks.  Patient was found to have urinary tract infection with significant leukocytosis.  He was started on IV Rocephin and admitted to the hospital.  Patient's urine cultures grew Enterobacter sensitive to Rocephin.  Patient seen and examined at bedside with the daughter.  He reports feeling better but not back to baseline yet.  His creatinine still elevated at 1.2 at this time.  PT evaluation recommending home health PT and hospital bed.  Which will be ordered.  Patient's daughter at bedside reports frequent urination is persistent.   Assessment & Plan:   Active Problems:   HTN (hypertension)   Gout attack   Acute lower UTI   Weakness   UTI (urinary tract infection)   DM2 (diabetes mellitus, type 2) (HCC)   HLD (hyperlipidemia)   Acute prerenal azotemia  Enterobacter UTI: Currently on IV ROCEPHIN, sensitivities resulted.  Pt reports frequent urination.  Reports feeling better but not back to baseline.    Generalized weakness Probably secondary to urinary tract infection, dehydration and hypokalemia.  Improving PT evaluation recommending home health PT, hospital bed in 3 and 1.    Profound hypokalemia and hypomagnesemia Replaced.  Repeat levels improving.    Gouty flareup in the right foot Left foot pain improving but not resolved yet.  Continue with colchicine and allopurinol for now.   Hyperlipidemia Continue with statin.   Mild AKI probably secondary to prerenal azotemia and UTI Recommend to continue with IV fluids for another 24 hours and recheck renal parameters.   Type 2 diabetes mellitus CBG (last 3)    Recent Labs    06/12/20 2047 06/13/20 0749 06/13/20 1236  GLUCAP 123* 93 116*   Continue with sliding scale insulin no changes in medications at this time.   Essential hypertension Well-controlled blood pressure parameters continue with lisinopril and metoprolol.  DVT prophylaxis:  Lovenox. Code Status: full code.  Family Communication: daughter at bedside,. Disposition:   Status is: Inpatient  Remains inpatient appropriate because:IV treatments appropriate due to intensity of illness or inability to take PO and Inpatient level of care appropriate due to severity of illness   Dispo: The patient is from: Home              Anticipated d/c is to: Home              Anticipated d/c date is: 1 day              Patient currently is not medically stable to d/c.       Consultants:   none  Procedures:none  Antimicrobials: Antibiotics Given (last 72 hours)    Date/Time Action Medication Dose Rate   06/11/20 1822 New Bag/Given   cefTRIAXone (ROCEPHIN) 1 g in sodium chloride 0.9 % 100 mL IVPB 1 g 200 mL/hr   06/12/20 1045 New Bag/Given   cefTRIAXone (ROCEPHIN) 1 g in sodium chloride 0.9 % 100 mL IVPB 1 g 200 mL/hr   06/13/20 0945 New Bag/Given   cefTRIAXone (ROCEPHIN) 1 g in sodium chloride 0.9 % 100 mL IVPB 1 g 200 mL/hr       Subjective: Pt reports frequent urination.  Objective: Vitals:   06/12/20 1258 06/12/20 2045 06/13/20 0508 06/13/20 1001  BP: (!) 150/82 (!) 168/87 (!) 156/77 (!) 146/72  Pulse: 76 91 72 77  Resp: 18 16 18 14   Temp: 98.3 F (36.8 C) 98.4 F (36.9 C) 97.7 F (36.5 C) 98.7 F (37.1 C)  TempSrc:      SpO2: 99% 100% 97% 98%  Weight:      Height:        Intake/Output Summary (Last 24 hours) at 06/13/2020 1309 Last data filed at 06/13/2020 1238 Gross per 24 hour  Intake 970 ml  Output 1800 ml  Net -830 ml   Filed Weights   06/11/20 2126  Weight: 65.8 kg    Examination:  General exam: Appears calm and comfortable   Respiratory system: Clear to auscultation. Respiratory effort normal. Cardiovascular system: S1 & S2 heard, RRR. No JVD,  No pedal edema. Gastrointestinal system: Abdomen is nondistended, soft and nontender.  Normal bowel sounds heard. Central nervous system: Alert and oriented. No focal neurological deficits. Extremities: Symmetric 5 x 5 power. Skin: No rashes, lesions or ulcers Psychiatry:  Mood & affect appropriate.     Data Reviewed: I have personally reviewed following labs and imaging studies  CBC: Recent Labs  Lab 06/11/20 1415 06/12/20 0806 06/13/20 0327  WBC 19.1* 11.2* 9.5  NEUTROABS 16.9*  --   --   HGB 10.4* 8.4* 8.3*  HCT 33.6* 26.5* 26.0*  MCV 91.6 91.4 92.5  PLT 656* 430* 407*    Basic Metabolic Panel: Recent Labs  Lab 06/11/20 1415 06/12/20 0334 06/13/20 0327  NA 142 139 138  K 3.2* 2.9* 3.5  CL 104 106 107  CO2 23 22 21*  GLUCOSE 147* 107* 117*  BUN 17 16 14   CREATININE 1.31* 1.19 1.25*  CALCIUM 8.6* 7.9* 8.0*  MG  --  1.4* 1.9    GFR: Estimated Creatinine Clearance: 40.5 mL/min (A) (by C-G formula based on SCr of 1.25 mg/dL (H)).  Liver Function Tests: Recent Labs  Lab 06/11/20 1415  AST 13*  ALT 17  ALKPHOS 83  BILITOT 0.6  PROT 7.5  ALBUMIN 3.0*    CBG: Recent Labs  Lab 06/12/20 1151 06/12/20 1619 06/12/20 2047 06/13/20 0749 06/13/20 1236  GLUCAP 124* 136* 123* 93 116*     Recent Results (from the past 240 hour(s))  Urine culture     Status: Abnormal   Collection Time: 06/11/20  2:14 PM   Specimen: Urine, Random  Result Value Ref Range Status   Specimen Description   Final    URINE, RANDOM Performed at [facility], [place] [address], [place], [place] [address]    Special Requests   Final    NONE Performed at [facility], [place] [address], [place], [place] [address]    Culture >=100,000 COLONIES/mL ENTEROBACTER AEROGENES (A)  Final   Report Status 06/13/2020 FINAL  Final    Organism ID, Bacteria ENTEROBACTER AEROGENES (A)  Final      Susceptibility   Enterobacter aerogenes - MIC*    CEFAZOLIN >=64 RESISTANT Resistant     CEFEPIME <=0.12 SENSITIVE Sensitive     CEFTRIAXONE 1 SENSITIVE Sensitive     CIPROFLOXACIN <=0.25 SENSITIVE Sensitive     GENTAMICIN <=1 SENSITIVE Sensitive     IMIPENEM 2 SENSITIVE Sensitive     NITROFURANTOIN 64 INTERMEDIATE Intermediate     TRIMETH/SULFA <=20 SENSITIVE Sensitive     PIP/TAZO 8 SENSITIVE Sensitive     * >=  100,000 COLONIES/mL ENTEROBACTER AEROGENES  Respiratory Panel by RT PCR (Flu A&B, Covid) - Nasopharyngeal Swab     Status: None   Collection Time: 06/11/20  4:32 PM   Specimen: Nasopharyngeal Swab  Result Value Ref Range Status   SARS Coronavirus 2 by RT PCR NEGATIVE NEGATIVE Final    Comment: (NOTE) SARS-CoV-2 target nucleic acids are NOT DETECTED.  The SARS-CoV-2 RNA is generally detectable in upper respiratoy specimens during the acute phase of infection. The lowest concentration of SARS-CoV-2 viral copies this assay can detect is 131 copies/mL. A negative result does not preclude SARS-Cov-2 infection and should not be used as the sole basis for treatment or other patient management decisions. A negative result may occur with  improper specimen collection/handling, submission of specimen other than nasopharyngeal swab, presence of viral mutation(s) within the areas targeted by this assay, and inadequate number of viral copies (<131 copies/mL). A negative result must be combined with clinical observations, patient history, and epidemiological information. The expected result is Negative.  Fact Sheet for Patients:  PinkCheek.be  Fact Sheet for Healthcare Providers:  GravelBags.it  This test is no t yet approved or cleared by the [place] FDA and  has been authorized for detection and/or diagnosis of SARS-CoV-2 by FDA under an Emergency Use  Authorization (EUA). This EUA will remain  in effect (meaning this test can be used) for the duration of the COVID-19 declaration under Section 564(b)(1) of the Act, 21 U.S.C. section 360bbb-3(b)(1), unless the authorization is terminated or revoked sooner.     Influenza A by PCR NEGATIVE NEGATIVE Final   Influenza B by PCR NEGATIVE NEGATIVE Final    Comment: (NOTE) The Xpert Xpress SARS-CoV-2/FLU/RSV assay is intended as an aid in  the diagnosis of influenza from Nasopharyngeal swab specimens and  should not be used as a sole basis for treatment. Nasal washings and  aspirates are unacceptable for Xpert Xpress SARS-CoV-2/FLU/RSV  testing.  Fact Sheet for Patients: PinkCheek.be  Fact Sheet for Healthcare Providers: GravelBags.it  This test is not yet approved or cleared by the [place] FDA and  has been authorized for detection and/or diagnosis of SARS-CoV-2 by  FDA under an Emergency Use Authorization (EUA). This EUA will remain  in effect (meaning this test can be used) for the duration of the  Covid-19 declaration under Section 564(b)(1) of the Act, 21  U.S.C. section 360bbb-3(b)(1), unless the authorization is  terminated or revoked. Performed at [facility], [place] [address] [place], [place] [address]          Radiology Studies: No results found.      Scheduled Meds: . acetaminophen  1,000 mg Oral TID   Or  . acetaminophen  650 mg Rectal TID  . allopurinol  100 mg Oral BID  . colchicine  0.6 mg Oral Daily  . enoxaparin (LOVENOX) injection  40 mg Subcutaneous QHS  . insulin aspart  0-15 Units Subcutaneous TID WC  . lisinopril  30 mg Oral Daily  . metoprolol succinate  100 mg Oral Daily  . pantoprazole  40 mg Oral Daily  . potassium chloride SA  20 mEq Oral Daily  . rosuvastatin  5 mg Oral Daily  . senna  1 tablet Oral BID  . traZODone  25 mg Oral QHS   Continuous  Infusions: . sodium chloride    . cefTRIAXone (ROCEPHIN)  IV 1 g (06/13/20 0945)     LOS: 1 day  Hosie Poisson, MD [facility]   To contact the attending provider between 7A-7P or the covering provider during after hours 7P-7A, please log into the web site [email or web address] and access using universal [place] password for that web site. If you do not have the password, please call the hospital operator.  06/13/2020, 1:09 PM

## 2020-06-13 NOTE — TOC Progression Note (Signed)
Transition of Care [facility]) - Progression Note    Patient Details  Name: Eduardo Moon MRN: [id number] Date of Birth: [birth year]  Transition of [facility]) CM/SW Contact  Lennart Pall, LCSW Phone Number: 06/13/2020, 4:02 PM  Clinical Narrative:    Met with patient and spoke with pt's daughter, [name], about dc needs.  They are aware and agreeable to recommendations for HHPT, hospital bed and 3n1 commode.  Orders placed and have referred to [employer] (DME) and [facility].  Daughter aware pt anticipated ready for dc tomorrow and requests he transport home via ambulance.  Will arrange.   Expected Discharge Plan: [place] Barriers to Discharge: Continued Medical Work up  Expected Discharge Plan and Services Expected Discharge Plan: [place] In-house Referral: Clinical Social Work Discharge Planning Services: CM Consult Post Acute Care Choice: [place] arrangements for the past 2 months: Single Family Home                                       Social Determinants of Health (SDOH) Interventions    Readmission Risk Interventions Readmission Risk Prevention Plan 06/13/2020  Post Dischage Appt Complete  Medication Screening Complete  Transportation Screening Complete  Some recent data might be hidden

## 2020-06-14 ENCOUNTER — Inpatient Hospital Stay (HOSPITAL_COMMUNITY): Payer: Medicare Other

## 2020-06-14 DIAGNOSIS — M109 Gout, unspecified: Secondary | ICD-10-CM | POA: Diagnosis not present

## 2020-06-14 DIAGNOSIS — N19 Unspecified kidney failure: Secondary | ICD-10-CM | POA: Diagnosis not present

## 2020-06-14 DIAGNOSIS — N3000 Acute cystitis without hematuria: Secondary | ICD-10-CM | POA: Diagnosis not present

## 2020-06-14 DIAGNOSIS — E119 Type 2 diabetes mellitus without complications: Secondary | ICD-10-CM | POA: Diagnosis not present

## 2020-06-14 LAB — GLUCOSE, CAPILLARY
Glucose-Capillary: 97 mg/dL (ref 70–99)
Glucose-Capillary: 117 mg/dL — ABNORMAL HIGH (ref 70–99)
Glucose-Capillary: 117 mg/dL — ABNORMAL HIGH (ref 70–99)
Glucose-Capillary: 116 mg/dL — ABNORMAL HIGH (ref 70–99)

## 2020-06-14 LAB — BASIC METABOLIC PANEL
Anion gap: 9 (ref 5–15)
BUN: 22 mg/dL (ref 8–23)
CO2: 22 mmol/L (ref 22–32)
Calcium: 8.2 mg/dL — ABNORMAL LOW (ref 8.9–10.3)
Chloride: 108 mmol/L (ref 98–111)
Creatinine, Ser: 1.65 mg/dL — ABNORMAL HIGH (ref 0.61–1.24)
GFR, Estimated: 43 mL/min — ABNORMAL LOW (ref >60)
Glucose, Bld: 107 mg/dL — ABNORMAL HIGH (ref 70–99)
Potassium: 3.9 mmol/L (ref 3.5–5.1)
Sodium: 139 mmol/L (ref 135–145)

## 2020-06-14 MED ORDER — CIPROFLOXACIN HCL 500 MG PO TABS
250.0000 mg | ORAL_TABLET | Freq: Two times a day (BID) | ORAL | Status: DC
  Administered 2020-06-14 – 2020-06-15 (×2): 250 mg via ORAL
  Filled 2020-06-14 (×2): qty 1

## 2020-06-14 NOTE — Progress Notes (Signed)
PROGRESS NOTE    Eduardo Moon  WLN:[id number] DOB: [birth year] DOA: 06/11/2020 PCP: Timoteo Gaul, FNP     No chief complaint on file.   Brief Narrative:   74 year old gentleman with prior history of hypertension, history of polio, with residual left hemiparesis uses a brace for the left leg, gout presents with progressive weakness over the last 3 weeks.  Patient was found to have urinary tract infection with significant leukocytosis.  He was started on IV Rocephin and admitted to the hospital.  Patient's urine cultures grew Enterobacter sensitive to Rocephin. He reports feeling better but not back to baseline yet.   PT evaluation recommending home health PT and hospital bed, which were ordered. Patient seen and examined at bedside. Patient's creatinine worsened from 1.2-1.6 this morning.    Assessment & Plan:   Active Problems:   HTN (hypertension)   Gout attack   Acute lower UTI   Weakness   UTI (urinary tract infection)   DM2 (diabetes mellitus, type 2) (HCC)   HLD (hyperlipidemia)   Acute prerenal azotemia  Enterobacter UTI: Currently on IV ROCEPHIN, sensitivities resulted. Transition to oral ciprofloxacin.  Pt reports frequent urination.  Reports feeling better but not back to baseline.   Patient afebrile and WBC count has normalized.   Generalized weakness Probably secondary to urinary tract infection, dehydration and hypokalemia.  Improving, PT evaluation recommending home health PT, hospital bed in 3 and 1.  Hospital bed has not been delivered yet.    Profound hypokalemia and hypomagnesemia Replaced repeat levels within normal limits    Gouty flareup in the right foot Left foot pain has improved we will DC colchicine as has creatinine is worsening.   Hyperlipidemia Continue with statin.   Mild AKI probably secondary to prerenal azotemia and UTI Recommend to continue with IV fluids for another 24 hours, Stop lisinopril, ibuprofen and colchicine.    Repeat renal parameters in am.  US RENAL ordered.  Urine output >3 lit yesterday.    Type 2 diabetes mellitus CBG (last 3)  Recent Labs    06/13/20 2133 06/14/20 0716 06/14/20 1159  GLUCAP 125* 97 117*   Continue with sliding scale insulin no changes in medications at this time.   Essential hypertension Well-controlled blood pressure parameters, no changes in medications  continue with lisinopril and metoprolol.   Anemia of chronic disease Hemoglobin stable around 8    DVT prophylaxis:  Lovenox. Code Status: full code.  Family Communication: Discussed with daughter over the phone   disposition:   Status is: Inpatient  Remains inpatient appropriate because:Ongoing diagnostic testing needed not appropriate for outpatient work up, Unsafe d/c plan and IV treatments appropriate due to intensity of illness or inability to take PO   Dispo: The patient is from: Home              Anticipated d/c is to: Home              Anticipated d/c date is: 1 day              Patient currently is not medically stable to d/c.       Consultants:   none  Procedures:none  Antimicrobials: Antibiotics Given (last 72 hours)    Date/Time Action Medication Dose Rate   06/11/20 1822 New Bag/Given   cefTRIAXone (ROCEPHIN) 1 g in sodium chloride 0.9 % 100 mL IVPB 1 g 200 mL/hr   06/12/20 1045 New Bag/Given   cefTRIAXone (ROCEPHIN) 1 g in sodium chloride  0.9 % 100 mL IVPB 1 g 200 mL/hr   06/13/20 0945 New Bag/Given   cefTRIAXone (ROCEPHIN) 1 g in sodium chloride 0.9 % 100 mL IVPB 1 g 200 mL/hr   06/14/20 0917 New Bag/Given   cefTRIAXone (ROCEPHIN) 1 g in sodium chloride 0.9 % 100 mL IVPB 1 g 200 mL/hr       Subjective: Pt reports frequent urination.   Objective: Vitals:   06/13/20 1702 06/13/20 2001 06/14/20 0615 06/14/20 1354  BP: (!) 168/83 (!) 157/93 (!) 144/75 (!) 159/82  Pulse: 71 79 70 75  Resp: 14 18 16 18   Temp: 98.6 F (37 C) 98 F (36.7 C) 98.2 F (36.8 C)  98.2 F (36.8 C)  TempSrc:  Oral Oral   SpO2: 99% 97% 97% 99%  Weight:      Height:        Intake/Output Summary (Last 24 hours) at 06/14/2020 1356 Last data filed at 06/14/2020 0900 Gross per 24 hour  Intake 2563.5 ml  Output 3775 ml  Net -1211.5 ml   Filed Weights   06/11/20 2126  Weight: 65.8 kg    Examination:  General exam: Alert and comfortable not in distress Respiratory system: Clear to auscultation bilaterally, no wheezing or rhonchi Cardiovascular system: S1-S2 heard, regular rate rhythm, no JVD, no pedal edema. Gastrointestinal system: Abdomen is soft, nontender, nondistended, bowel sounds are normal Central nervous system: Alert and oriented, grossly nonfocal Extremities: No cyanosis or clubbing Skin: No rashes seen Psychiatry: Mood is appropriate.     Data Reviewed: I have personally reviewed following labs and imaging studies  CBC: Recent Labs  Lab 06/11/20 1415 06/12/20 0806 06/13/20 0327  WBC 19.1* 11.2* 9.5  NEUTROABS 16.9*  --   --   HGB 10.4* 8.4* 8.3*  HCT 33.6* 26.5* 26.0*  MCV 91.6 91.4 92.5  PLT 656* 430* 407*    Basic Metabolic Panel: Recent Labs  Lab 06/11/20 1415 06/12/20 0334 06/13/20 0327 06/14/20 0328  NA 142 139 138 139  K 3.2* 2.9* 3.5 3.9  CL 104 106 107 108  CO2 23 22 21* 22  GLUCOSE 147* 107* 117* 107*  BUN 17 16 14 22   CREATININE 1.31* 1.19 1.25* 1.65*  CALCIUM 8.6* 7.9* 8.0* 8.2*  MG  --  1.4* 1.9  --     GFR: Estimated Creatinine Clearance: 30.7 mL/min (A) (by C-G formula based on SCr of 1.65 mg/dL (H)).  Liver Function Tests: Recent Labs  Lab 06/11/20 1415  AST 13*  ALT 17  ALKPHOS 83  BILITOT 0.6  PROT 7.5  ALBUMIN 3.0*    CBG: Recent Labs  Lab 06/13/20 1236 06/13/20 1700 06/13/20 2133 06/14/20 0716 06/14/20 1159  GLUCAP 116* 114* 125* 97 117*     Recent Results (from the past 240 hour(s))  Urine culture     Status: Abnormal   Collection Time: 06/11/20  2:14 PM   Specimen: Urine,  Random  Result Value Ref Range Status   Specimen Description   Final    URINE, RANDOM Performed at [facility], [place] [address] [place], [place] [address]    Special Requests   Final    NONE Performed at [facility], [place] [address] [place], [place] [address]    Culture >=100,000 COLONIES/mL ENTEROBACTER AEROGENES (A)  Final   Report Status 06/13/2020 FINAL  Final   Organism ID, Bacteria ENTEROBACTER AEROGENES (A)  Final      Susceptibility   Enterobacter aerogenes - MIC*  CEFAZOLIN >=64 RESISTANT Resistant     CEFEPIME <=0.12 SENSITIVE Sensitive     CEFTRIAXONE 1 SENSITIVE Sensitive     CIPROFLOXACIN <=0.25 SENSITIVE Sensitive     GENTAMICIN <=1 SENSITIVE Sensitive     IMIPENEM 2 SENSITIVE Sensitive     NITROFURANTOIN 64 INTERMEDIATE Intermediate     TRIMETH/SULFA <=20 SENSITIVE Sensitive     PIP/TAZO 8 SENSITIVE Sensitive     * >=100,000 COLONIES/mL ENTEROBACTER AEROGENES  Respiratory Panel by RT PCR (Flu A&B, Covid) - Nasopharyngeal Swab     Status: None   Collection Time: 06/11/20  4:32 PM   Specimen: Nasopharyngeal Swab  Result Value Ref Range Status   SARS Coronavirus 2 by RT PCR NEGATIVE NEGATIVE Final    Comment: (NOTE) SARS-CoV-2 target nucleic acids are NOT DETECTED.  The SARS-CoV-2 RNA is generally detectable in upper respiratoy specimens during the acute phase of infection. The lowest concentration of SARS-CoV-2 viral copies this assay can detect is 131 copies/mL. A negative result does not preclude SARS-Cov-2 infection and should not be used as the sole basis for treatment or other patient management decisions. A negative result may occur with  improper specimen collection/handling, submission of specimen other than nasopharyngeal swab, presence of viral mutation(s) within the areas targeted by this assay, and inadequate number of viral copies (<131 copies/mL). A negative result must be combined with  clinical observations, patient history, and epidemiological information. The expected result is Negative.  Fact Sheet for Patients:  PinkCheek.be  Fact Sheet for Healthcare Providers:  GravelBags.it  This test is no t yet approved or cleared by the [place] FDA and  has been authorized for detection and/or diagnosis of SARS-CoV-2 by FDA under an Emergency Use Authorization (EUA). This EUA will remain  in effect (meaning this test can be used) for the duration of the COVID-19 declaration under Section 564(b)(1) of the Act, 21 U.S.C. section 360bbb-3(b)(1), unless the authorization is terminated or revoked sooner.     Influenza A by PCR NEGATIVE NEGATIVE Final   Influenza B by PCR NEGATIVE NEGATIVE Final    Comment: (NOTE) The Xpert Xpress SARS-CoV-2/FLU/RSV assay is intended as an aid in  the diagnosis of influenza from Nasopharyngeal swab specimens and  should not be used as a sole basis for treatment. Nasal washings and  aspirates are unacceptable for Xpert Xpress SARS-CoV-2/FLU/RSV  testing.  Fact Sheet for Patients: PinkCheek.be  Fact Sheet for Healthcare Providers: GravelBags.it  This test is not yet approved or cleared by the [place] FDA and  has been authorized for detection and/or diagnosis of SARS-CoV-2 by  FDA under an Emergency Use Authorization (EUA). This EUA will remain  in effect (meaning this test can be used) for the duration of the  Covid-19 declaration under Section 564(b)(1) of the Act, 21  U.S.C. section 360bbb-3(b)(1), unless the authorization is  terminated or revoked. Performed at [facility], [place] [address] [place], [place] [address]          Radiology Studies: No results found.      Scheduled Meds: . acetaminophen  1,000 mg Oral TID   Or  . acetaminophen  650 mg Rectal TID  . allopurinol   100 mg Oral BID  . enoxaparin (LOVENOX) injection  40 mg Subcutaneous QHS  . insulin aspart  0-15 Units Subcutaneous TID WC  . metoprolol succinate  100 mg Oral Daily  . pantoprazole  40 mg Oral Daily  . potassium chloride SA  20 mEq Oral Daily  .  rosuvastatin  5 mg Oral Daily  . senna  1 tablet Oral BID  . traZODone  25 mg Oral QHS   Continuous Infusions: . sodium chloride 75 mL/hr at 06/14/20 0245  . cefTRIAXone (ROCEPHIN)  IV 1 g (06/14/20 0917)     LOS: 2 days        Hosie Poisson, MD [facility]   To contact the attending provider between 7A-7P or the covering provider during after hours 7P-7A, please log into the web site [email or web address] and access using universal [place] password for that web site. If you do not have the password, please call the hospital operator.  06/14/2020, 1:56 PM

## 2020-06-14 NOTE — Progress Notes (Signed)
Physical Therapy Treatment Patient Details Name: Eduardo Moon MRN: [id number] DOB: [birth year] Today's Date: 06/14/2020    History of Present Illness 74 year old male with HTN, history of polio with residual left hemiparesis using a brace for the left leg, history of gout, presents to the hospital with indolent progressive weakness over the last 3 weeks.  Pt found to have UTI and hypokalemia    PT Comments    Patient making gradual progress with acute therapy and initiated training with RW for gait today. He continues to require Max +2 assist for sit<>stands with RW or crutches however has smoother hand transition to RW and improved balance in both static standing and gait. Pt educated on benefits of ambulating with RW vs crutches and encouraged to use RW at this time. He will continue to benefit from skilled PT interventions to address impairments in acute setting and with HHPT follow up.    Follow Up Recommendations  Home health PT;Supervision for mobility/OOB     Equipment Recommendations  Hospital bed;3in1 (PT)    Recommendations for Other Services       Precautions / Restrictions Precautions Precautions: Fall Required Braces or Orthoses: Other Brace Other Brace: brace for L LE locks into extension for mobility Restrictions Weight Bearing Restrictions: No    Mobility  Bed Mobility Overal bed mobility: Modified Independent Bed Mobility: Supine to Sit     Supine to sit: HOB elevated     General bed mobility comments: pt standing EOB with OT when therapist arrived.  Transfers Overall transfer level: Needs assistance Equipment used: Rolling walker (2 wheeled);Crutches Transfers: Sit to/from Stand Sit to Stand: Max assist;+2 physical assistance;+2 safety/equipment         General transfer comment: Max Assist +2 to rise from recliner 3x during session. Pt instructed to attempt rising with bil UE use on armrests to initiate power up, however pt unable to. Required PT  and OT on either side with pt stabilizing self on therapists shoulders while Max assist provided to lift and obtain standing balance. Pt requried Lt brace locked to stand and has posterior lean on 1/3 stands due to poor foot placement. Pt complete with RW and with crutches. More stable with use of RW for hand transition.  Ambulation/Gait Ambulation/Gait assistance: Min assist;+2 physical assistance;+2 safety/equipment Gait Distance (Feet): 30 Feet (20, 10) Assistive device: Rolling walker (2 wheeled);Crutches Gait Pattern/deviations: Step-to pattern;Decreased stance time - right;Decreased stride length;Trunk flexed Gait velocity: decr   General Gait Details: pt with flexed posture relying on leaning on crutches for support. pt unsteady with crutches and Mod assist required to position crutches for gait and progressed to min assist for safety. Min assist throughout use of RW to ambule 20' in room and pt maintained safe proximity to RW with cues at start. Balance improved with turning with use of RW.   Stairs             Wheelchair Mobility    Modified Rankin (Stroke Patients Only)       Balance Overall balance assessment: Needs assistance Sitting-balance support: Feet supported;No upper extremity supported Sitting balance-Leahy Scale: Good     Standing balance support: During functional activity;Bilateral upper extremity supported Standing balance-Leahy Scale: Poor                              Cognition Arousal/Alertness: Awake/alert Behavior During Therapy: WFL for tasks assessed/performed Overall Cognitive Status: Within Functional Limits for tasks assessed  Exercises      General Comments        Pertinent Vitals/Pain Pain Assessment: No/denies pain    Home Living                      Prior Function            PT Goals (current goals can now be found in the care plan section) Acute  Rehab PT Goals Patient Stated Goal: get better PT Goal Formulation: With patient/family Time For Goal Achievement: 06/26/20 Potential to Achieve Goals: Good Progress towards PT goals: Progressing toward goals    Frequency    Min 3X/week      PT Plan Current plan remains appropriate    Co-evaluation              AM-PAC PT "6 Clicks" Mobility   Outcome Measure  Help needed turning from your back to your side while in a flat bed without using bedrails?: A Lot Help needed moving from lying on your back to sitting on the side of a flat bed without using bedrails?: A Lot Help needed moving to and from a bed to a chair (including a wheelchair)?: A Lot Help needed standing up from a chair using your arms (e.g., wheelchair or bedside chair)?: A Lot Help needed to walk in hospital room?: A Lot Help needed climbing 3-5 steps with a railing? : Total 6 Click Score: 11    End of Session Equipment Utilized During Treatment: Gait belt Activity Tolerance: Patient tolerated treatment well Patient left: in chair;with call bell/phone within reach;with chair alarm set;with nursing/sitter in room Nurse Communication: Mobility status PT Visit Diagnosis: Muscle weakness (generalized) (M62.81);Difficulty in walking, not elsewhere classified (R26.2)     Time: 1134 (dovetailed with OT session)-1159 PT Time Calculation (min) (ACUTE ONLY): 25 min  Charges:  $Gait Training: 8-22 mins                    Verner Mould, DPT Acute Rehabilitation Services  Office [phone] Pager [phone]  06/14/2020 2:12 PM

## 2020-06-14 NOTE — Progress Notes (Signed)
Occupational Therapy Treatment Patient Details Name: Eduardo Moon MRN: [id number] DOB: 08-Jul-1946 Today's Date: 06/14/2020    History of present illness 74 year old male with HTN, history of polio with residual left hemiparesis using a brace for the left leg, history of gout, presents to the hospital with indolent progressive weakness over the last 3 weeks.  Pt found to have UTI and hypokalemia   OT comments  Treatment focused on performing self care task out of bed and to improve mobility. Patient mod assist to stand with arm over therapist shoulder and pulling on recliner to stand. +2 for placing crutches and taking steps to recliner. Patient able to stand at sink for grooming with elbows on sink.   Follow Up Recommendations  Home health OT;Supervision/Assistance - 24 hour;Other (comment)    Equipment Recommendations  Hospital bed    Recommendations for Other Services      Precautions / Restrictions Precautions Precautions: Fall Required Braces or Orthoses: Other Brace Other Brace: brace for L LE locks into extension for mobility Restrictions Weight Bearing Restrictions: No       Mobility Bed Mobility Overal bed mobility: Modified Independent Bed Mobility: Supine to Sit     Supine to sit: HOB elevated     General bed mobility comments: Uses bed rails and foot rail to transfer himself to side of bed.  Transfers Overall transfer level: Needs assistance Equipment used: Crutches Transfers: Sit to/from Stand Sit to Stand: Mod assist;+2 safety/equipment         General transfer comment: Mod assist to stand from bed height - with patient's arm over therapist's shoulders and patient pulling on recliner in front of him. Required assistance (+2 assistance) to place crutches and maintain patient's balance. Patient unsteady and min assist to take steps in front of sink with +2 for safety.    Balance Overall balance assessment: Needs assistance Sitting-balance support:  Feet supported;No upper extremity supported Sitting balance-Leahy Scale: Good     Standing balance support: Bilateral upper extremity supported Standing balance-Leahy Scale: Poor                             ADL either performed or assessed with clinical judgement   ADL Overall ADL's : Needs assistance/impaired     Grooming: Minimal assistance;Standing;Oral care Grooming Details (indicate cue type and reason): min assist to stand at sink to perform oral care. Patient braced on elbows at sink to maintain position.             Lower Body Dressing: Minimal assistance Lower Body Dressing Details (indicate cue type and reason): Min assist to don right show in bed - needed assistance to slid heel due to right foot swollen.                     Vision Patient Visual Report: No change from baseline     Perception     Praxis      Cognition Arousal/Alertness: Awake/alert Behavior During Therapy: WFL for tasks assessed/performed Overall Cognitive Status: Within Functional Limits for tasks assessed                                          Exercises     Shoulder Instructions       General Comments      Pertinent Vitals/ Pain  Pain Assessment: No/denies pain  Home Living                                          Prior Functioning/Environment              Frequency  Min 2X/week        Progress Toward Goals  OT Goals(current goals can now be found in the care plan section)  Progress towards OT goals: Progressing toward goals  Acute Rehab OT Goals Patient Stated Goal: get better OT Goal Formulation: With patient Time For Goal Achievement: 06/26/20 Potential to Achieve Goals: Good  Plan Discharge plan remains appropriate    Co-evaluation                 AM-PAC OT "6 Clicks" Daily Activity     Outcome Measure   Help from another person eating meals?: None Help from another person taking  care of personal grooming?: A Little Help from another person toileting, which includes using toliet, bedpan, or urinal?: Total Help from another person bathing (including washing, rinsing, drying)?: A Lot Help from another person to put on and taking off regular upper body clothing?: A Little Help from another person to put on and taking off regular lower body clothing?: A Little 6 Click Score: 16    End of Session Equipment Utilized During Treatment: Gait belt;Other (comment) (crutches)  OT Visit Diagnosis: Other abnormalities of gait and mobility (R26.89);Muscle weakness (generalized) (M62.81);Pain   Activity Tolerance Patient tolerated treatment well   Patient Left in chair;with call bell/phone within reach;with nursing/sitter in room   Nurse Communication Mobility status        Time: 1017-5102 OT Time Calculation (min): 14 min  Charges: OT General Charges $OT Visit: 1 Visit OT Treatments $Self Care/Home Management : 8-22 mins  Derl Barrow, OTR/L [place] [phone] Pager: [place] 06/14/2020, 1:40 PM

## 2020-06-15 DIAGNOSIS — N3000 Acute cystitis without hematuria: Secondary | ICD-10-CM | POA: Diagnosis not present

## 2020-06-15 DIAGNOSIS — N390 Urinary tract infection, site not specified: Secondary | ICD-10-CM | POA: Diagnosis not present

## 2020-06-15 DIAGNOSIS — N19 Unspecified kidney failure: Secondary | ICD-10-CM | POA: Diagnosis not present

## 2020-06-15 DIAGNOSIS — E119 Type 2 diabetes mellitus without complications: Secondary | ICD-10-CM | POA: Diagnosis not present

## 2020-06-15 LAB — BASIC METABOLIC PANEL
Anion gap: 10 (ref 5–15)
BUN: 23 mg/dL (ref 8–23)
CO2: 22 mmol/L (ref 22–32)
Calcium: 8.1 mg/dL — ABNORMAL LOW (ref 8.9–10.3)
Chloride: 107 mmol/L (ref 98–111)
Creatinine, Ser: 1.38 mg/dL — ABNORMAL HIGH (ref 0.61–1.24)
GFR, Estimated: 53 mL/min — ABNORMAL LOW (ref >60)
Glucose, Bld: 94 mg/dL (ref 70–99)
Potassium: 4 mmol/L (ref 3.5–5.1)
Sodium: 139 mmol/L (ref 135–145)

## 2020-06-15 LAB — GLUCOSE, CAPILLARY
Glucose-Capillary: 83 mg/dL (ref 70–99)
Glucose-Capillary: 118 mg/dL — ABNORMAL HIGH (ref 70–99)

## 2020-06-15 MED ORDER — BLOOD GLUCOSE MONITOR KIT: PACK | 0 refills | Status: DC

## 2020-06-15 MED ORDER — LISINOPRIL 30 MG PO TABS
30.0000 mg | ORAL_TABLET | Freq: Every day | ORAL | Status: DC
Start: 2020-06-22 — End: 2020-10-23

## 2020-06-15 MED ORDER — SENNA 8.6 MG PO TABS
1.0000 | ORAL_TABLET | Freq: Two times a day (BID) | ORAL | 0 refills | Status: DC
Start: 2020-06-15 — End: 2020-10-23

## 2020-06-15 MED ORDER — AMLODIPINE BESYLATE 5 MG PO TABS: 5.0000 mg | ORAL_TABLET | Freq: Every day | ORAL | 11 refills | Status: DC

## 2020-06-15 MED ORDER — CIPROFLOXACIN HCL 250 MG PO TABS: 250.0000 mg | ORAL_TABLET | Freq: Two times a day (BID) | ORAL | 0 refills | Status: AC

## 2020-06-15 NOTE — TOC Transition Note (Signed)
Transition of Care [facility]) - CM/SW Discharge Note   Patient Details  Name: Eduardo Moon MRN: [id number] Date of Birth: April 30, 1946  [facility] CM/SW Contact:  Lennart Pall, LCSW Phone Number: 06/15/2020, 11:20 AM   Clinical Narrative:    Pt medically cleared for dc today.  Have confirmed with daughter, [name], that hospital bed and bsc have been delivered to the home.  [facility] to provide HHPT.  Daughter requests [facility] transport home.  No further TOC needs.   Final next level of care: Home w Home Health Services Barriers to Discharge: Barriers Resolved   Patient Goals and CMS Choice Patient states their goals for this hospitalization and ongoing recovery are:: daughter prefers patient go home CMS Medicare.gov Compare Post Acute Care list provided to:: Patient Choice offered to / list presented to : Adult Children  Discharge Placement                       Discharge Plan and Services In-house Referral: Clinical Social Work Discharge Planning Services: CM Consult Post Acute Care Choice: Home Health          DME Arranged: Bedside commode, Hospital bed DME Agency: [employer] Date DME Agency Contacted: 06/13/20 Time DME Agency Contacted: 1500 Representative spoke with at [place]: Freda Munro [place]: PT [place]: [place] Date [place]: 06/13/20 Time [place]: 1500 Representative spoke with at [place]: Cindie  Social Determinants of Health ([place]) Interventions     Readmission Risk Interventions Readmission Risk Prevention Plan 06/13/2020  Post Dischage Appt Complete  Medication Screening Complete  Transportation Screening Complete  Some recent data might be hidden

## 2020-06-15 NOTE — Care Management Important Message (Signed)
Important Message  Patient Details IM Letter given to the Patient. Name: Eduardo Moon MRN: [id number] Date of Birth: 07/07/1946   Medicare Important Message Given:  Yes     Kerin Salen 06/15/2020, 11:46 AM

## 2020-06-16 NOTE — Discharge Summary (Signed)
Physician Discharge Summary  Eduardo Moon GYI:[id number] DOB: 01/16/1946 DOA: 06/11/2020  PCP: Timoteo Gaul, FNP  Admit date: 06/11/2020 Discharge date: 06/15/2020  Admitted From: HOME Disposition:  HOME.  Recommendations for Outpatient Follow-up:  1. Follow up with PCP in 1-2 weeks 2. Please obtain BMP/CBC in one week 3. Please follow up with urology in 1 month.  4. We have held your lisinopril and metformin for AKI, please recheck BMP in one week and slowly restart the meds as per PCP.   Home Health:YES Equipment/Devices:Hospital bed  Discharge Condition: stable.  CODE STATUS: FULL CODE.  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  Patient was found to have urinary tract infection with significant leukocytosis.  He was started on IV Rocephin and admitted to the hospital.  Patient's urine cultures grew Enterobacter sensitive to Rocephin. He reports feeling better but not back to baseline yet.   PT evaluation recommending home health PT and hospital bed, which were ordered.   Discharge Diagnoses:  Active Problems:   HTN (hypertension)   Gout attack   Acute lower UTI   Weakness   UTI (urinary tract infection)   DM2 (diabetes mellitus, type 2) (HCC)   HLD (hyperlipidemia)   Acute prerenal azotemia  Enterobacter UTI: Started on IV rocephin.  sensitivities resulted. Transition to oral ciprofloxacin on discharge.  Patient afebrile and WBC count has normalized.   Generalized weakness Probably secondary to urinary tract infection, dehydration and hypokalemia.  Improving, PT evaluation recommending home health PT, hospital bed in 3 and 1.      Profound hypokalemia and hypomagnesemia Replaced, repeat levels within normal limits    Gouty flareup  in the right foot Left foot pain has improved/ resolved.    Hyperlipidemia Continue with statin.   Mild AKI probably secondary to prerenal azotemia and UTI  Stop lisinopril, ibuprofen and colchicine.  US RENALdid not show any hydronephrosis.  Repeat renal parameters show improvement on holding the lisinopril, ibuprofen and colchicine.     Type 2 diabetes mellitus A1c  Is 5.4  Holding metformin for AKI.  Recommend to follow upwith PCP regarding resumption of metformin.   Essential hypertension Well controlled.   Anemia of chronic disease Hemoglobin stable around 8     Discharge Instructions  Discharge Instructions    Diet - low sodium heart healthy   Complete by: As directed    Discharge instructions   Complete by: As directed    Please follow up with PCP and recheck renal parameters in one week.   Increase activity slowly   Complete by: As directed      Allergies as of 06/15/2020   No Known Allergies     Medication List    STOP taking these medications   metFORMIN 500 MG tablet Commonly known as: GLUCOPHAGE   naproxen 375 MG tablet Commonly known as: NAPROSYN     TAKE these medications   allopurinol 100 MG tablet Commonly known as: ZYLOPRIM Take 100 mg by mouth daily.   amLODipine 5 MG tablet Commonly known as: NORVASC Take 1 tablet (5 mg total) by mouth daily.   blood glucose meter kit and supplies Kit Dispense based on patient and insurance preference. Use up to four times daily as directed. (FOR ICD-9 250.00, 250.01).   ciprofloxacin 250 MG tablet Commonly known as:  CIPRO Take 1 tablet (250 mg total) by mouth 2 (two) times daily for 3 days.   diazepam 5 MG tablet Commonly known as: VALIUM Take 5 mg by mouth 3 (three) times daily as needed for anxiety.   HYDROcodone-acetaminophen 7.5-325 MG tablet Commonly known as: NORCO Take 1 tablet by mouth 4 (four) times daily as needed for pain.   lisinopril 30 MG tablet Commonly known  as: ZESTRIL Take 1 tablet (30 mg total) by mouth daily. Start taking on: June 22, 2020 What changed: These instructions start on June 22, 2020. If you are unsure what to do until then, ask your doctor or other care provider.   metoprolol succinate 100 MG 24 hr tablet Commonly known as: TOPROL-XL Take 100 mg by mouth daily.   omeprazole 20 MG capsule Commonly known as: PRILOSEC Take 20 mg by mouth 2 (two) times daily.   rosuvastatin 5 MG tablet Commonly known as: CRESTOR Take 5 mg by mouth every evening.   senna 8.6 MG Tabs tablet Commonly known as: SENOKOT Take 1 tablet (8.6 mg total) by mouth 2 (two) times daily.   Vitamin D (Ergocalciferol) 1.25 MG (50000 UNIT) Caps capsule Commonly known as: DRISDOL Take 50,000 Units by mouth once a week.       Follow-up Information    [facility], [facility] Follow up.   Specialty: [place] Why: to provide home health physical therapy Contact information: [place] [address] [phone]        Timoteo Gaul, FNP. Schedule an appointment as soon as possible for a visit in 1 week(s).   Specialty: Family Medicine Contact information: [address] [place] [address] [phone]              No Known Allergies  Consultations:  None    Procedures/Studies: US RENAL  Result Date: 06/14/2020 CLINICAL DATA:  74 year old male with acute renal insufficiency there is EXAM: RENAL / URINARY TRACT ULTRASOUND COMPLETE COMPARISON:  CT abdomen pelvis dated 06/25/2010. FINDINGS: Right Kidney: Renal measurements: 10.0 x 5.5 x 4.2 cm = volume: 121 mL. Echogenicity within normal limits. No mass or hydronephrosis visualized. Left Kidney: Renal measurements: 9.0 x 5.1 x 4.3 cm = volume: 104 mL. Normal echogenicity. No hydronephrosis or shadowing stone. There is a 2.5 cm pole cyst. Bladder: Appears normal for degree of bladder distention. Other: None. IMPRESSION: Left renal upper  pole cyst otherwise unremarkable renal ultrasound. Electronically Signed   By: Anner Crete M.D.   On: 06/14/2020 17:27       Subjective: No new complaints.   Discharge Exam: Vitals:   06/14/20 2153 06/15/20 0642  BP: (!) 166/80 (!) 160/74  Pulse: 69 71  Resp: 17 20  Temp: 97.9 F (36.6 C) 98.6 F (37 C)  SpO2: 99% 99%   Vitals:   06/14/20 0615 06/14/20 1354 06/14/20 2153 06/15/20 0642  BP: (!) 144/75 (!) 159/82 (!) 166/80 (!) 160/74  Pulse: 70 75 69 71  Resp: 16 18 17 20   Temp: 98.2 F (36.8 C) 98.2 F (36.8 C) 97.9 F (36.6 C) 98.6 F (37 C)  TempSrc: Oral  Oral Oral  SpO2: 97% 99% 99% 99%  Weight:      Height:        General: Pt is alert, awake, not in acute distress Cardiovascular: RRR, S1/S2 +, no rubs, no gallops Respiratory: CTA bilaterally, no wheezing, no rhonchi Abdominal: Soft, NT, ND, bowel sounds + Extremities: no edema,  no cyanosis    The results of significant diagnostics from this hospitalization (including imaging, microbiology, ancillary and laboratory) are listed below for reference.     Microbiology: Recent Results (from the past 240 hour(s))  Urine culture     Status: Abnormal   Collection Time: 06/11/20  2:14 PM   Specimen: Urine, Random  Result Value Ref Range Status   Specimen Description   Final    URINE, RANDOM Performed at [place] [address], [place], [place] [address]    Special Requests   Final    NONE Performed at [facility], [place] [address], [place], [place] [address]    Culture >=100,000 COLONIES/mL ENTEROBACTER AEROGENES (A)  Final   Report Status 06/13/2020 FINAL  Final   Organism ID, Bacteria ENTEROBACTER AEROGENES (A)  Final      Susceptibility   Enterobacter aerogenes - MIC*    CEFAZOLIN >=64 RESISTANT Resistant     CEFEPIME <=0.12 SENSITIVE Sensitive     CEFTRIAXONE 1 SENSITIVE Sensitive     CIPROFLOXACIN <=0.25 SENSITIVE Sensitive     GENTAMICIN <=1  SENSITIVE Sensitive     IMIPENEM 2 SENSITIVE Sensitive     NITROFURANTOIN 64 INTERMEDIATE Intermediate     TRIMETH/SULFA <=20 SENSITIVE Sensitive     PIP/TAZO 8 SENSITIVE Sensitive     * >=100,000 COLONIES/mL ENTEROBACTER AEROGENES  Respiratory Panel by RT PCR (Flu A&B, Covid) - Nasopharyngeal Swab     Status: None   Collection Time: 06/11/20  4:32 PM   Specimen: Nasopharyngeal Swab  Result Value Ref Range Status   SARS Coronavirus 2 by RT PCR NEGATIVE NEGATIVE Final    Comment: (NOTE) SARS-CoV-2 target nucleic acids are NOT DETECTED.  The SARS-CoV-2 RNA is generally detectable in upper respiratoy specimens during the acute phase of infection. The lowest concentration of SARS-CoV-2 viral copies this assay can detect is 131 copies/mL. A negative result does not preclude SARS-Cov-2 infection and should not be used as the sole basis for treatment or other patient management decisions. A negative result may occur with  improper specimen collection/handling, submission of specimen other than nasopharyngeal swab, presence of viral mutation(s) within the areas targeted by this assay, and inadequate number of viral copies (<131 copies/mL). A negative result must be combined with clinical observations, patient history, and epidemiological information. The expected result is Negative.  Fact Sheet for Patients:  PinkCheek.be  Fact Sheet for Healthcare Providers:  GravelBags.it  This test is no t yet approved or cleared by the [place] FDA and  has been authorized for detection and/or diagnosis of SARS-CoV-2 by FDA under an Emergency Use Authorization (EUA). This EUA will remain  in effect (meaning this test can be used) for the duration of the COVID-19 declaration under Section 564(b)(1) of the Act, 21 U.S.C. section 360bbb-3(b)(1), unless the authorization is terminated or revoked sooner.     Influenza A by PCR NEGATIVE  NEGATIVE Final   Influenza B by PCR NEGATIVE NEGATIVE Final    Comment: (NOTE) The Xpert Xpress SARS-CoV-2/FLU/RSV assay is intended as an aid in  the diagnosis of influenza from Nasopharyngeal swab specimens and  should not be used as a sole basis for treatment. Nasal washings and  aspirates are unacceptable for Xpert Xpress SARS-CoV-2/FLU/RSV  testing.  Fact Sheet for Patients: PinkCheek.be  Fact Sheet for Healthcare Providers: GravelBags.it  This test is not yet approved or cleared by the [place] FDA and  has been authorized for detection and/or diagnosis  of SARS-CoV-2 by  FDA under an Emergency Use Authorization (EUA). This EUA will remain  in effect (meaning this test can be used) for the duration of the  Covid-19 declaration under Section 564(b)(1) of the Act, 21  U.S.C. section 360bbb-3(b)(1), unless the authorization is  terminated or revoked. Performed at [facility], [place] [address] [place], [place] [address]      Labs: BNP (last 3 results) No results for input(s): BNP in the last 8760 hours. Basic Metabolic Panel: Recent Labs  Lab 06/11/20 1415 06/12/20 0334 06/13/20 0327 06/14/20 0328 06/15/20 0352  NA 142 139 138 139 139  K 3.2* 2.9* 3.5 3.9 4.0  CL 104 106 107 108 107  CO2 23 22 21* 22 22  GLUCOSE 147* 107* 117* 107* 94  BUN 17 16 14 22 23   CREATININE 1.31* 1.19 1.25* 1.65* 1.38*  CALCIUM 8.6* 7.9* 8.0* 8.2* 8.1*  MG  --  1.4* 1.9  --   --    Liver Function Tests: Recent Labs  Lab 06/11/20 1415  AST 13*  ALT 17  ALKPHOS 83  BILITOT 0.6  PROT 7.5  ALBUMIN 3.0*   No results for input(s): LIPASE, AMYLASE in the last 168 hours. No results for input(s): AMMONIA in the last 168 hours. CBC: Recent Labs  Lab 06/11/20 1415 06/12/20 0806 06/13/20 0327  WBC 19.1* 11.2* 9.5  NEUTROABS 16.9*  --   --   HGB 10.4* 8.4* 8.3*  HCT 33.6* 26.5* 26.0*  MCV 91.6 91.4 92.5   PLT 656* 430* 407*   Cardiac Enzymes: No results for input(s): CKTOTAL, CKMB, CKMBINDEX, TROPONINI in the last 168 hours. BNP: Invalid input(s): POCBNP CBG: Recent Labs  Lab 06/14/20 1159 06/14/20 1651 06/14/20 2156 06/15/20 0727 06/15/20 1156  GLUCAP 117* 117* 116* 83 118*   D-Dimer No results for input(s): DDIMER in the last 72 hours. Hgb A1c No results for input(s): HGBA1C in the last 72 hours. Lipid Profile No results for input(s): CHOL, HDL, LDLCALC, TRIG, CHOLHDL, LDLDIRECT in the last 72 hours. Thyroid function studies No results for input(s): TSH, T4TOTAL, T3FREE, THYROIDAB in the last 72 hours.  Invalid input(s): FREET3 Anemia work up No results for input(s): VITAMINB12, FOLATE, FERRITIN, TIBC, IRON, RETICCTPCT in the last 72 hours. Urinalysis    Component Value Date/Time   COLORURINE YELLOW 06/11/2020 1415   APPEARANCEUR HAZY (A) 06/11/2020 1415   LABSPEC 1.018 06/11/2020 1415   PHURINE 5.0 06/11/2020 1415   GLUCOSEU NEGATIVE 06/11/2020 1415   HGBUR MODERATE (A) 06/11/2020 1415   BILIRUBINUR NEGATIVE 06/11/2020 1415   KETONESUR 5 (A) 06/11/2020 1415   PROTEINUR 30 (A) 06/11/2020 1415   UROBILINOGEN 1.0 04/16/2012 1950   NITRITE POSITIVE (A) 06/11/2020 1415   LEUKOCYTESUR LARGE (A) 06/11/2020 1415   Sepsis Labs Invalid input(s): PROCALCITONIN,  WBC,  LACTICIDVEN Microbiology Recent Results (from the past 240 hour(s))  Urine culture     Status: Abnormal   Collection Time: 06/11/20  2:14 PM   Specimen: Urine, Random  Result Value Ref Range Status   Specimen Description   Final    URINE, RANDOM Performed at [facility], [place] [address] [place], [place] [address]    Special Requests   Final    NONE Performed at [facility], [place] [address], [place], [place] [address]    Culture >=100,000 COLONIES/mL ENTEROBACTER AEROGENES (A)  Final   Report Status 06/13/2020 FINAL  Final   Organism ID, Bacteria ENTEROBACTER  AEROGENES (  A)  Final      Susceptibility   Enterobacter aerogenes - MIC*    CEFAZOLIN >=64 RESISTANT Resistant     CEFEPIME <=0.12 SENSITIVE Sensitive     CEFTRIAXONE 1 SENSITIVE Sensitive     CIPROFLOXACIN <=0.25 SENSITIVE Sensitive     GENTAMICIN <=1 SENSITIVE Sensitive     IMIPENEM 2 SENSITIVE Sensitive     NITROFURANTOIN 64 INTERMEDIATE Intermediate     TRIMETH/SULFA <=20 SENSITIVE Sensitive     PIP/TAZO 8 SENSITIVE Sensitive     * >=100,000 COLONIES/mL ENTEROBACTER AEROGENES  Respiratory Panel by RT PCR (Flu A&B, Covid) - Nasopharyngeal Swab     Status: None   Collection Time: 06/11/20  4:32 PM   Specimen: Nasopharyngeal Swab  Result Value Ref Range Status   SARS Coronavirus 2 by RT PCR NEGATIVE NEGATIVE Final    Comment: (NOTE) SARS-CoV-2 target nucleic acids are NOT DETECTED.  The SARS-CoV-2 RNA is generally detectable in upper respiratoy specimens during the acute phase of infection. The lowest concentration of SARS-CoV-2 viral copies this assay can detect is 131 copies/mL. A negative result does not preclude SARS-Cov-2 infection and should not be used as the sole basis for treatment or other patient management decisions. A negative result may occur with  improper specimen collection/handling, submission of specimen other than nasopharyngeal swab, presence of viral mutation(s) within the areas targeted by this assay, and inadequate number of viral copies (<131 copies/mL). A negative result must be combined with clinical observations, patient history, and epidemiological information. The expected result is Negative.  Fact Sheet for Patients:  PinkCheek.be  Fact Sheet for Healthcare Providers:  GravelBags.it  This test is no t yet approved or cleared by the [place] FDA and  has been authorized for detection and/or diagnosis of SARS-CoV-2 by FDA under an Emergency Use Authorization (EUA). This EUA will  remain  in effect (meaning this test can be used) for the duration of the COVID-19 declaration under Section 564(b)(1) of the Act, 21 U.S.C. section 360bbb-3(b)(1), unless the authorization is terminated or revoked sooner.     Influenza A by PCR NEGATIVE NEGATIVE Final   Influenza B by PCR NEGATIVE NEGATIVE Final    Comment: (NOTE) The Xpert Xpress SARS-CoV-2/FLU/RSV assay is intended as an aid in  the diagnosis of influenza from Nasopharyngeal swab specimens and  should not be used as a sole basis for treatment. Nasal washings and  aspirates are unacceptable for Xpert Xpress SARS-CoV-2/FLU/RSV  testing.  Fact Sheet for Patients: PinkCheek.be  Fact Sheet for Healthcare Providers: GravelBags.it  This test is not yet approved or cleared by the [place] FDA and  has been authorized for detection and/or diagnosis of SARS-CoV-2 by  FDA under an Emergency Use Authorization (EUA). This EUA will remain  in effect (meaning this test can be used) for the duration of the  Covid-19 declaration under Section 564(b)(1) of the Act, 21  U.S.C. section 360bbb-3(b)(1), unless the authorization is  terminated or revoked. Performed at [facility], [place] [address], [place], [place] [address]      Time coordinating discharge: 36 minutes.  SIGNED:   Hosie Poisson, MD  [facility] 06/16/2020, 1:06 PM

## 2020-06-22 ENCOUNTER — Inpatient Hospital Stay (HOSPITAL_COMMUNITY): Payer: Medicare Other

## 2020-06-22 ENCOUNTER — Emergency Department (HOSPITAL_COMMUNITY): Payer: Medicare Other

## 2020-06-22 ENCOUNTER — Other Ambulatory Visit: Payer: Self-pay

## 2020-06-22 ENCOUNTER — Encounter (HOSPITAL_COMMUNITY): Payer: Self-pay

## 2020-06-22 ENCOUNTER — Inpatient Hospital Stay (HOSPITAL_COMMUNITY)
Admission: EM | Admit: 2020-06-22 | Discharge: 2020-06-28 | DRG: 640 | Disposition: A | Discharge status: Home Health | Payer: Medicare Other | Attending: Family Medicine | Admitting: Family Medicine

## 2020-06-22 DIAGNOSIS — E876 Hypokalemia: Secondary | ICD-10-CM | POA: Diagnosis present

## 2020-06-22 DIAGNOSIS — K5900 Constipation, unspecified: Secondary | ICD-10-CM | POA: Diagnosis present

## 2020-06-22 DIAGNOSIS — E872 Acidosis: Secondary | ICD-10-CM | POA: Diagnosis present

## 2020-06-22 DIAGNOSIS — E785 Hyperlipidemia, unspecified: Secondary | ICD-10-CM | POA: Diagnosis present

## 2020-06-22 DIAGNOSIS — I10 Essential (primary) hypertension: Secondary | ICD-10-CM | POA: Diagnosis present

## 2020-06-22 DIAGNOSIS — R531 Weakness: Secondary | ICD-10-CM | POA: Diagnosis present

## 2020-06-22 DIAGNOSIS — Z20822 Contact with and (suspected) exposure to covid-19: Secondary | ICD-10-CM | POA: Diagnosis present

## 2020-06-22 DIAGNOSIS — M10061 Idiopathic gout, right knee: Secondary | ICD-10-CM | POA: Diagnosis present

## 2020-06-22 DIAGNOSIS — D72829 Elevated white blood cell count, unspecified: Secondary | ICD-10-CM | POA: Diagnosis present

## 2020-06-22 DIAGNOSIS — R6510 Systemic inflammatory response syndrome (SIRS) of non-infectious origin without acute organ dysfunction: Secondary | ICD-10-CM | POA: Diagnosis present

## 2020-06-22 DIAGNOSIS — E860 Dehydration: Secondary | ICD-10-CM | POA: Diagnosis present

## 2020-06-22 DIAGNOSIS — G8194 Hemiplegia, unspecified affecting left nondominant side: Secondary | ICD-10-CM | POA: Diagnosis present

## 2020-06-22 DIAGNOSIS — K219 Gastro-esophageal reflux disease without esophagitis: Secondary | ICD-10-CM | POA: Diagnosis present

## 2020-06-22 DIAGNOSIS — E1165 Type 2 diabetes mellitus with hyperglycemia: Secondary | ICD-10-CM | POA: Diagnosis present

## 2020-06-22 DIAGNOSIS — M10041 Idiopathic gout, right hand: Secondary | ICD-10-CM | POA: Diagnosis present

## 2020-06-22 DIAGNOSIS — D509 Iron deficiency anemia, unspecified: Secondary | ICD-10-CM | POA: Diagnosis present

## 2020-06-22 DIAGNOSIS — I340 Nonrheumatic mitral (valve) insufficiency: Secondary | ICD-10-CM | POA: Diagnosis not present

## 2020-06-22 DIAGNOSIS — N179 Acute kidney failure, unspecified: Secondary | ICD-10-CM | POA: Diagnosis present

## 2020-06-22 DIAGNOSIS — M7989 Other specified soft tissue disorders: Secondary | ICD-10-CM | POA: Diagnosis not present

## 2020-06-22 DIAGNOSIS — I472 Ventricular tachycardia: Secondary | ICD-10-CM | POA: Diagnosis not present

## 2020-06-22 DIAGNOSIS — E538 Deficiency of other specified B group vitamins: Secondary | ICD-10-CM | POA: Diagnosis present

## 2020-06-22 DIAGNOSIS — E43 Unspecified severe protein-calorie malnutrition: Secondary | ICD-10-CM | POA: Diagnosis present

## 2020-06-22 DIAGNOSIS — R609 Edema, unspecified: Secondary | ICD-10-CM

## 2020-06-22 DIAGNOSIS — Z8546 Personal history of malignant neoplasm of prostate: Secondary | ICD-10-CM

## 2020-06-22 DIAGNOSIS — E119 Type 2 diabetes mellitus without complications: Secondary | ICD-10-CM

## 2020-06-22 DIAGNOSIS — Z8612 Personal history of poliomyelitis: Secondary | ICD-10-CM | POA: Diagnosis not present

## 2020-06-22 DIAGNOSIS — R001 Bradycardia, unspecified: Secondary | ICD-10-CM | POA: Diagnosis present

## 2020-06-22 DIAGNOSIS — M10071 Idiopathic gout, right ankle and foot: Secondary | ICD-10-CM | POA: Diagnosis present

## 2020-06-22 DIAGNOSIS — Z6824 Body mass index (BMI) 24.0-24.9, adult: Secondary | ICD-10-CM | POA: Diagnosis not present

## 2020-06-22 DIAGNOSIS — Z7409 Other reduced mobility: Secondary | ICD-10-CM | POA: Diagnosis present

## 2020-06-22 DIAGNOSIS — M25569 Pain in unspecified knee: Secondary | ICD-10-CM | POA: Diagnosis present

## 2020-06-22 DIAGNOSIS — D649 Anemia, unspecified: Secondary | ICD-10-CM | POA: Diagnosis present

## 2020-06-22 LAB — RESP PANEL BY RT-PCR (FLU A&B, COVID) ARPGX2
Influenza A by PCR: NEGATIVE
Influenza B by PCR: NEGATIVE
SARS Coronavirus 2 by RT PCR: NEGATIVE

## 2020-06-22 LAB — LACTIC ACID, PLASMA
Lactic Acid, Venous: 2.3 mmol/L (ref 0.5–1.9)
Lactic Acid, Venous: 2 mmol/L (ref 0.5–1.9)

## 2020-06-22 LAB — COMPREHENSIVE METABOLIC PANEL
ALT: 85 U/L — ABNORMAL HIGH (ref 0–44)
AST: 34 U/L (ref 15–41)
Albumin: 1.9 g/dL — ABNORMAL LOW (ref 3.5–5.0)
Alkaline Phosphatase: 180 U/L — ABNORMAL HIGH (ref 38–126)
Anion gap: 14 (ref 5–15)
BUN: 19 mg/dL (ref 8–23)
CO2: 19 mmol/L — ABNORMAL LOW (ref 22–32)
Calcium: 9 mg/dL (ref 8.9–10.3)
Chloride: 100 mmol/L (ref 98–111)
Creatinine, Ser: 1.35 mg/dL — ABNORMAL HIGH (ref 0.61–1.24)
GFR, Estimated: 54 mL/min — ABNORMAL LOW (ref >60)
Glucose, Bld: 207 mg/dL — ABNORMAL HIGH (ref 70–99)
Potassium: 2.9 mmol/L — ABNORMAL LOW (ref 3.5–5.1)
Sodium: 133 mmol/L — ABNORMAL LOW (ref 135–145)
Total Bilirubin: 0.9 mg/dL (ref 0.3–1.2)
Total Protein: 6.6 g/dL (ref 6.5–8.1)

## 2020-06-22 LAB — CBC WITH DIFFERENTIAL/PLATELET
Abs Immature Granulocytes: 0.15 10*3/uL — ABNORMAL HIGH (ref 0.00–0.07)
Basophils Absolute: 0 10*3/uL (ref 0.0–0.1)
Basophils Relative: 0 %
Eosinophils Absolute: 0 10*3/uL (ref 0.0–0.5)
Eosinophils Relative: 0 %
HCT: 31.3 % — ABNORMAL LOW (ref 39.0–52.0)
Hemoglobin: 9.9 g/dL — ABNORMAL LOW (ref 13.0–17.0)
Immature Granulocytes: 1 %
Lymphocytes Relative: 6 %
Lymphs Abs: 0.9 10*3/uL (ref 0.7–4.0)
MCH: 28.4 pg (ref 26.0–34.0)
MCHC: 31.6 g/dL (ref 30.0–36.0)
MCV: 89.9 fL (ref 80.0–100.0)
Monocytes Absolute: 1.2 10*3/uL — ABNORMAL HIGH (ref 0.1–1.0)
Monocytes Relative: 8 %
Neutro Abs: 13.9 10*3/uL — ABNORMAL HIGH (ref 1.7–7.7)
Neutrophils Relative %: 85 %
Platelets: 366 10*3/uL (ref 150–400)
RBC: 3.48 MIL/uL — ABNORMAL LOW (ref 4.22–5.81)
RDW: 14.5 % (ref 11.5–15.5)
WBC: 16.2 10*3/uL — ABNORMAL HIGH (ref 4.0–10.5)
nRBC: 0 % (ref 0.0–0.2)

## 2020-06-22 LAB — TYPE AND SCREEN
ABO/RH(D): O POS
Antibody Screen: NEGATIVE

## 2020-06-22 LAB — ECHOCARDIOGRAM COMPLETE
Area-P 1/2: 3.37 cm2
Height: 66 in
S' Lateral: 3.19 cm
Weight: 2464 oz

## 2020-06-22 LAB — T4, FREE: Free T4: 1.07 ng/dL (ref 0.61–1.12)

## 2020-06-22 LAB — RETICULOCYTES
Immature Retic Fract: 8.9 % (ref 2.3–15.9)
RBC.: 3.45 MIL/uL — ABNORMAL LOW (ref 4.22–5.81)
Retic Count, Absolute: 39 10*3/uL (ref 19.0–186.0)
Retic Ct Pct: 1.1 % (ref 0.4–3.1)

## 2020-06-22 LAB — IRON AND TIBC
Iron: 18 ug/dL — ABNORMAL LOW (ref 45–182)
Saturation Ratios: 14 % — ABNORMAL LOW (ref 17.9–39.5)
TIBC: 127 ug/dL — ABNORMAL LOW (ref 250–450)
UIBC: 109 ug/dL

## 2020-06-22 LAB — ABO/RH: ABO/RH(D): O POS

## 2020-06-22 LAB — URINALYSIS, ROUTINE W REFLEX MICROSCOPIC
Bacteria, UA: NONE SEEN
Bilirubin Urine: NEGATIVE
Glucose, UA: NEGATIVE mg/dL
Hgb urine dipstick: NEGATIVE
Ketones, ur: NEGATIVE mg/dL
Leukocytes,Ua: NEGATIVE
Nitrite: NEGATIVE
Protein, ur: 100 mg/dL — AB
Specific Gravity, Urine: 1.017 (ref 1.005–1.030)
pH: 5 (ref 5.0–8.0)

## 2020-06-22 LAB — FERRITIN: Ferritin: 1379 ng/mL — ABNORMAL HIGH (ref 24–336)

## 2020-06-22 LAB — TROPONIN I (HIGH SENSITIVITY)
Troponin I (High Sensitivity): 14 ng/L (ref <18)
Troponin I (High Sensitivity): 11 ng/L (ref <18)

## 2020-06-22 LAB — PHOSPHORUS: Phosphorus: 4.2 mg/dL (ref 2.5–4.6)

## 2020-06-22 LAB — SEDIMENTATION RATE: Sed Rate: >140 mm/hr — ABNORMAL HIGH (ref 0–16)

## 2020-06-22 LAB — GLUCOSE, CAPILLARY: Glucose-Capillary: 122 mg/dL — ABNORMAL HIGH (ref 70–99)

## 2020-06-22 LAB — TSH: TSH: 1.993 u[IU]/mL (ref 0.350–4.500)

## 2020-06-22 LAB — D-DIMER, QUANTITATIVE: D-Dimer, Quant: 8.05 ug/mL-FEU — ABNORMAL HIGH (ref 0.00–0.50)

## 2020-06-22 LAB — HEMOGLOBIN A1C
Hgb A1c MFr Bld: 5.7 % — ABNORMAL HIGH (ref 4.8–5.6)
Mean Plasma Glucose: 116.89 mg/dL

## 2020-06-22 LAB — PROCALCITONIN: Procalcitonin: 3.46 ng/mL

## 2020-06-22 LAB — GAMMA GT: GGT: 83 U/L — ABNORMAL HIGH (ref 7–50)

## 2020-06-22 LAB — PSA: Prostatic Specific Antigen: 0.04 ng/mL (ref 0.00–4.00)

## 2020-06-22 LAB — VITAMIN D 25 HYDROXY (VIT D DEFICIENCY, FRACTURES): Vit D, 25-Hydroxy: 112.66 ng/mL — ABNORMAL HIGH (ref 30–100)

## 2020-06-22 LAB — VITAMIN B12: Vitamin B-12: 183 pg/mL (ref 180–914)

## 2020-06-22 LAB — MAGNESIUM: Magnesium: 1.5 mg/dL — ABNORMAL LOW (ref 1.7–2.4)

## 2020-06-22 LAB — FOLATE: Folate: 9.7 ng/mL (ref >5.9)

## 2020-06-22 MED ORDER — TEMAZEPAM 7.5 MG PO CAPS
7.5000 mg | ORAL_CAPSULE | Freq: Every evening | ORAL | Status: DC | PRN
  Administered 2020-06-23 – 2020-06-27 (×6): 7.5 mg via ORAL
  Filled 2020-06-22 (×7): qty 1

## 2020-06-22 MED ORDER — SODIUM CHLORIDE 0.9 % IV BOLUS
1000.0000 mL | Freq: Once | INTRAVENOUS | Status: AC
  Administered 2020-06-22: 1000 mL via INTRAVENOUS

## 2020-06-22 MED ORDER — VANCOMYCIN HCL 500 MG/100ML IV SOLN
500.0000 mg | Freq: Two times a day (BID) | INTRAVENOUS | Status: DC
  Administered 2020-06-23 – 2020-06-24 (×4): 500 mg via INTRAVENOUS
  Filled 2020-06-22 (×5): qty 100

## 2020-06-22 MED ORDER — POTASSIUM CHLORIDE 10 MEQ/100ML IV SOLN
10.0000 meq | INTRAVENOUS | Status: AC
  Administered 2020-06-22 (×2): 10 meq via INTRAVENOUS
  Filled 2020-06-22 (×2): qty 100

## 2020-06-22 MED ORDER — HYDRALAZINE HCL 20 MG/ML IJ SOLN: 10.0000 mg | Freq: Four times a day (QID) | INTRAMUSCULAR | Status: DC | PRN

## 2020-06-22 MED ORDER — METRONIDAZOLE IN NACL 5-0.79 MG/ML-% IV SOLN
500.0000 mg | Freq: Once | INTRAVENOUS | Status: DC
  Administered 2020-06-22: 500 mg via INTRAVENOUS
  Filled 2020-06-22: qty 100

## 2020-06-22 MED ORDER — INSULIN ASPART 100 UNIT/ML ~~LOC~~ SOLN
0.0000 [IU] | Freq: Every day | SUBCUTANEOUS | Status: DC
  Administered 2020-06-23: 2 [IU] via SUBCUTANEOUS
  Administered 2020-06-24: 3 [IU] via SUBCUTANEOUS
  Administered 2020-06-26 – 2020-06-27 (×2): 2 [IU] via SUBCUTANEOUS

## 2020-06-22 MED ORDER — HEPARIN SODIUM (PORCINE) 5000 UNIT/ML IJ SOLN
5000.0000 [IU] | Freq: Three times a day (TID) | INTRAMUSCULAR | Status: DC
  Administered 2020-06-22 – 2020-06-28 (×17): 5000 [IU] via SUBCUTANEOUS
  Filled 2020-06-22 (×17): qty 1

## 2020-06-22 MED ORDER — SODIUM CHLORIDE 0.9 % IV SOLN
2.0000 g | Freq: Once | INTRAVENOUS | Status: AC
  Administered 2020-06-22: 2 g via INTRAVENOUS
  Filled 2020-06-22: qty 2

## 2020-06-22 MED ORDER — METOPROLOL SUCCINATE ER 100 MG PO TB24: 100.0000 mg | ORAL_TABLET | Freq: Every day | ORAL | Status: DC

## 2020-06-22 MED ORDER — ROSUVASTATIN CALCIUM 5 MG PO TABS
5.0000 mg | ORAL_TABLET | Freq: Every evening | ORAL | Status: DC
  Administered 2020-06-23 – 2020-06-27 (×5): 5 mg via ORAL
  Filled 2020-06-22 (×5): qty 1

## 2020-06-22 MED ORDER — POTASSIUM CHLORIDE CRYS ER 20 MEQ PO TBCR
40.0000 meq | EXTENDED_RELEASE_TABLET | Freq: Once | ORAL | Status: AC
  Administered 2020-06-22: 40 meq via ORAL
  Filled 2020-06-22: qty 2

## 2020-06-22 MED ORDER — INSULIN ASPART 100 UNIT/ML ~~LOC~~ SOLN
0.0000 [IU] | Freq: Three times a day (TID) | SUBCUTANEOUS | Status: DC
  Administered 2020-06-23: 2 [IU] via SUBCUTANEOUS
  Administered 2020-06-24: 5 [IU] via SUBCUTANEOUS
  Administered 2020-06-24 – 2020-06-25 (×3): 3 [IU] via SUBCUTANEOUS
  Administered 2020-06-25: 5 [IU] via SUBCUTANEOUS
  Administered 2020-06-26: 3 [IU] via SUBCUTANEOUS
  Administered 2020-06-26: 2 [IU] via SUBCUTANEOUS
  Administered 2020-06-27: 3 [IU] via SUBCUTANEOUS
  Administered 2020-06-27: 2 [IU] via SUBCUTANEOUS

## 2020-06-22 MED ORDER — HYDROCODONE-ACETAMINOPHEN 5-325 MG PO TABS
1.0000 | ORAL_TABLET | Freq: Once | ORAL | Status: AC
  Administered 2020-06-22: 1 via ORAL
  Filled 2020-06-22: qty 1

## 2020-06-22 MED ORDER — DIAZEPAM 5 MG PO TABS: 5.0000 mg | ORAL_TABLET | Freq: Three times a day (TID) | ORAL | Status: DC | PRN

## 2020-06-22 MED ORDER — VANCOMYCIN HCL IN DEXTROSE 1-5 GM/200ML-% IV SOLN: 1000.0000 mg | Freq: Once | INTRAVENOUS | Status: DC

## 2020-06-22 MED ORDER — METOPROLOL SUCCINATE ER 50 MG PO TB24
50.0000 mg | ORAL_TABLET | Freq: Every day | ORAL | Status: DC
  Administered 2020-06-23 – 2020-06-28 (×6): 50 mg via ORAL
  Filled 2020-06-22 (×6): qty 1

## 2020-06-22 MED ORDER — VANCOMYCIN HCL 1500 MG/300ML IV SOLN
1500.0000 mg | Freq: Once | INTRAVENOUS | Status: AC
  Administered 2020-06-22: 1500 mg via INTRAVENOUS
  Filled 2020-06-22: qty 300

## 2020-06-22 MED ORDER — ALLOPURINOL 100 MG PO TABS
100.0000 mg | ORAL_TABLET | Freq: Every day | ORAL | Status: DC
  Administered 2020-06-23 – 2020-06-28 (×6): 100 mg via ORAL
  Filled 2020-06-22 (×6): qty 1

## 2020-06-22 MED ORDER — PIPERACILLIN-TAZOBACTAM 3.375 G IVPB
3.3750 g | Freq: Three times a day (TID) | INTRAVENOUS | Status: DC
  Administered 2020-06-22 – 2020-06-25 (×9): 3.375 g via INTRAVENOUS
  Filled 2020-06-22 (×10): qty 50

## 2020-06-22 MED ORDER — PANTOPRAZOLE SODIUM 40 MG IV SOLR
40.0000 mg | Freq: Two times a day (BID) | INTRAVENOUS | Status: DC
  Administered 2020-06-23 – 2020-06-26 (×7): 40 mg via INTRAVENOUS
  Filled 2020-06-22 (×8): qty 40

## 2020-06-22 MED ORDER — SODIUM CHLORIDE 0.9 % IV SOLN: 2.0000 g | Freq: Two times a day (BID) | INTRAVENOUS | Status: DC

## 2020-06-22 MED ORDER — LACTATED RINGERS IV SOLN: INTRAVENOUS | Status: AC

## 2020-06-22 NOTE — Progress Notes (Signed)
  Echocardiogram 2D Echocardiogram has been performed.  Randa Lynn Rudolph Daoust 06/22/2020, 3:46 PM

## 2020-06-22 NOTE — ED Provider Notes (Signed)
[name] Provider Note   CSN: [id number] Arrival date & time: 06/22/20  1050     History Chief Complaint  Patient presents with  . Recurrent UTI    Eduardo Moon is a 74 y.o. 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  Chart reviewed.  Patient was recently admitted to the hospital on 11/15-11/19 where he was diagnosed with a urinary tract infection and was found to be dehydrated with severe hypokalemia and hypomagnesemia. Patient notes he was better for a few days after leaving the hospital, but admits to worsening of symptoms over the past few days. Denies unilateral weakness and speech changes. He finished his last antibiotic on Wednesday. Unsure how high his temperature has been at home. No treatment prior to arrival. No aggravating or alleviating factors.  Denies chest pain, shortness of breath, abdominal pain, nausea, vomiting, diarrhea.  Patient also admits to numerous gout flares in his bilateral ankles (right>left) and left elbow.  He notes he was given treatment during his admission in the hospital.  History obtained from patient and past medical records. No interpreter used during encounter.      Past Medical History:  Diagnosis Date  . Arthritis   . History of prostate cancer s/p radiactive seed implants  03-24-2011  . Hypertension   . Mild acid reflux watches diet  . Polio age 36  . Scrotal abscess   . 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   uses w/c for distance    Patient Active Problem List   Diagnosis Date Noted  . HTN (hypertension) 06/11/2020  . Gout attack 06/11/2020  . Acute lower UTI 06/11/2020  . Weakness 06/11/2020  . UTI (urinary tract infection) 06/11/2020  . DM2 (diabetes mellitus, type 2)  ([place]) 06/11/2020  . HLD (hyperlipidemia) 06/11/2020  . Acute prerenal azotemia 06/11/2020  . Epididymo-orchitis, acute 05/07/2012  . Scrotal abscess 05/07/2012    Past Surgical History:  Procedure Laterality Date  . ANKLE RECONSTRUCTION  1957   BILATERAL  . ORCHIECTOMY  05/07/2012   Procedure: ORCHIECTOMY;  Surgeon: Bernestine Amass, MD;  Location: [facility];  Service: Urology;  Laterality: Right;  . RADIOACTIVE PROSTATE SEED IMPLANTS  04-21-2011    [facility]   PROSTATE CANCER  . SCROTAL EXPLORATION  05/07/2012   Procedure: SCROTUM EXPLORATION;  Surgeon: Bernestine Amass, MD;  Location: 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  Social History   Tobacco Use  . Smoking status: Never Smoker  . Smokeless tobacco: Never Used  Substance Use Topics  . Alcohol use: No  . Drug use: No    Home Medications Prior to Admission medications   Medication Sig Start Date End Date Taking? Authorizing Provider  allopurinol (ZYLOPRIM) 100 MG tablet Take 100 mg by mouth daily. 05/23/20  Yes [provider]  amLODipine (NORVASC) 5 MG tablet Take 1 tablet (5 mg total) by mouth daily. 06/15/20 06/15/21 Yes Hosie Poisson, MD  blood glucose meter kit and supplies KIT Dispense based on patient and insurance preference. Use up to four times daily as directed. (FOR ICD-9 250.00, 250.01). Patient taking differently: Inject 1 each into the skin  See admin instructions. Dispense based on patient and insurance preference. Use up to four times daily as directed. (FOR ICD-9 250.00, 250.01). 06/15/20  Yes Hosie Poisson, MD  diazepam (VALIUM) 5 MG tablet Take 5 mg by mouth 3 (three) times daily as needed for anxiety. 05/25/20  Yes [provider]  HYDROcodone-acetaminophen (NORCO) 7.5-325 MG tablet Take 1 tablet by mouth in the morning, at noon, and at bedtime.  05/23/20  Yes  [provider]  lisinopril (ZESTRIL) 30 MG tablet Take 1 tablet (30 mg total) by mouth daily. 06/22/20  Yes Hosie Poisson, MD  metoprolol succinate (TOPROL-XL) 100 MG 24 hr tablet Take 100 mg by mouth daily. 03/22/20  Yes [provider]  omeprazole (PRILOSEC) 20 MG capsule Take 20 mg by mouth 2 (two) times daily. 04/18/20  Yes [provider]  rosuvastatin (CRESTOR) 5 MG tablet Take 5 mg by mouth every evening.  04/23/20  Yes [provider]  senna (SENOKOT) 8.6 MG TABS tablet Take 1 tablet (8.6 mg total) by mouth 2 (two) times daily. Patient taking differently: Take 1 tablet by mouth every other day.  06/15/20  Yes Hosie Poisson, MD  Vitamin D, Ergocalciferol, (DRISDOL) 1.25 MG (50000 UNIT) CAPS capsule Take 50,000 Units by mouth once a week. 04/24/20  Yes [provider]    Allergies    Patient has no known allergies.  Review of Systems   Review of Systems  Constitutional: Positive for chills and fever.  Gastrointestinal: Negative for abdominal pain, diarrhea, nausea and vomiting.  Genitourinary: Positive for frequency.  Neurological: Positive for weakness (generalized). Negative for dizziness, facial asymmetry and speech difficulty.  All other systems reviewed and are negative.   Physical Exam Updated Vital Signs BP (!) 142/73   Pulse 79   Temp 99.7 F (37.6 C) (Rectal)   Resp 15   Ht 5' 6"  (1.676 m)   Wt 69.9 kg   SpO2 100%   BMI 24.86 kg/m   Physical Exam Vitals and nursing note reviewed.  Constitutional:      General: He is not in acute distress. HENT:     Head: Normocephalic.  Eyes:     Pupils: Pupils are equal, round, and reactive to light.  Cardiovascular:     Rate and Rhythm: Normal rate and regular rhythm.     Pulses: Normal pulses.     Heart sounds: Normal heart sounds. No murmur heard.  No friction rub. No gallop.   Pulmonary:     Effort: Pulmonary effort is normal.     Breath sounds: Normal breath sounds.    Abdominal:     General: Abdomen is flat. Bowel sounds are normal. There is no distension.     Palpations: Abdomen is soft.     Tenderness: There is no abdominal tenderness. There is no right CVA tenderness, left CVA tenderness, guarding or rebound.  Musculoskeletal:     Cervical back: Neck supple.     Comments: Brace on left leg. Erythematous and edematous left elbow. Full ROM of left elbow. Edematous right knee. Full ROM of right knee  Skin:    General: Skin is warm and dry.  Neurological:     General: No focal deficit present.  Psychiatric:        Mood and Affect: Mood normal.        Behavior: Behavior normal.     ED Results / Procedures / Treatments   Labs (all labs ordered are listed, but only abnormal results are displayed) Labs  Reviewed  LACTIC ACID, PLASMA - Abnormal; Notable for the following components:      Result Value   Lactic Acid, Venous 2.3 (*)    All other components within normal limits  LACTIC ACID, PLASMA - Abnormal; Notable for the following components:   Lactic Acid, Venous 2.0 (*)    All other components within normal limits  COMPREHENSIVE METABOLIC PANEL - Abnormal; Notable for the following components:   Sodium 133 (*)    Potassium 2.9 (*)    CO2 19 (*)    Glucose, Bld 207 (*)    Creatinine, Ser 1.35 (*)    Albumin 1.9 (*)    ALT 85 (*)    Alkaline Phosphatase 180 (*)    GFR, Estimated 54 (*)    All other components within normal limits  CBC WITH DIFFERENTIAL/PLATELET - Abnormal; Notable for the following components:   WBC 16.2 (*)    RBC 3.48 (*)    Hemoglobin 9.9 (*)    HCT 31.3 (*)    Neutro Abs 13.9 (*)    Monocytes Absolute 1.2 (*)    Abs Immature Granulocytes 0.15 (*)    All other components within normal limits  URINALYSIS, ROUTINE W REFLEX MICROSCOPIC - Abnormal; Notable for the following components:   Protein, ur 100 (*)    All other components within normal limits  CULTURE, BLOOD (ROUTINE X 2)  CULTURE, BLOOD (ROUTINE X 2)  RESP  PANEL BY RT-PCR (FLU A&B, COVID) [id number]  URINE CULTURE    EKG None  Radiology No results found.  Procedures Procedures (including critical care time)  Medications Ordered in ED Medications  potassium chloride 10 mEq in 100 mL IVPB (10 mEq Intravenous New Bag/Given 06/22/20 1432)  lactated ringers infusion (has no administration in time range)  ceFEPIme (MAXIPIME) 2 g in sodium chloride 0.9 % 100 mL IVPB (has no administration in time range)  metroNIDAZOLE (FLAGYL) IVPB 500 mg (has no administration in time range)  vancomycin (VANCOREADY) IVPB 1500 mg/300 mL (has no administration in time range)  vancomycin (VANCOREADY) IVPB 500 mg/100 mL (has no administration in time range)  ceFEPIme (MAXIPIME) 2 g in sodium chloride 0.9 % 100 mL IVPB (has no administration in time range)  metoprolol succinate (TOPROL-XL) 24 hr tablet 100 mg (has no administration in time range)  rosuvastatin (CRESTOR) tablet 5 mg (has no administration in time range)  diazepam (VALIUM) tablet 5 mg (has no administration in time range)  allopurinol (ZYLOPRIM) tablet 100 mg (has no administration in time range)  sodium chloride 0.9 % bolus 1,000 mL (1,000 mLs Intravenous New Bag/Given 06/22/20 1332)  potassium chloride SA (KLOR-CON) CR tablet 40 mEq (40 mEq Oral Given 06/22/20 1332)  HYDROcodone-acetaminophen (NORCO/VICODIN) 5-325 MG per tablet 1 tablet (1 tablet Oral Given 06/22/20 1332)    ED Course  I have reviewed the triage vital signs and the nursing notes.  Pertinent labs & imaging results that were available during my care of the patient were reviewed by me and considered in my medical decision making (see chart for details).  Clinical Course as of Jun 23 1431  Fri Jun 22, 2020  1139 WBC(!): 16.2 [CA]  1314 Lactic Acid, Venous(!!): 2.3 [CA]  1314 Potassium(!): 2.9 [CA]  1314 WBC(!): 16.2 [CA]  1325 Patient requested home dose of Norco for knee pain.   [CA]  1424 Discussed case with Dr. Posey Pronto who  agrees to admit patient for further treatment.   [CA]    Clinical Course User Index [  CA] Karie Kirks   MDM Rules/Calculators/A&P                         74 year old male presents to the ED due to generalized weakness increased urination, fever, and chills x3 days.  Patient was recently admitted to hospital 11/15-11/19 for acute cystitis and severe hypokalemia and hypomagnesemia.  Patient notes symptoms improved after hospital admission however quickly returned.  Patient also admits to numerous "gout" flares.  Upon arrival, patient afebrile, tachycardic or hypoxic.  Patient no acute distress and nontoxic-appearing.  Physical exam reassuring.  Abdomen soft, nondistended, nontender.  Negative CVA tenderness bilaterally.  Erythematous and edematous left elbow, right knee, and bilateral ankles.  Will obtain routine labs to check for electrolyte abnormalities given recent history.  Also obtain UA to rule out acute cystitis given urinary symptoms.   CBC significant for leukocytosis at 16.2 and hemoglobin at 9.9.  CMP significant for hypokalemia at 2.9, hyper glycemia 207 with no anion gap.  Doubt DKA.  Mild elevation creatinine of 1.35.  Elevated ALT at 85.  IV fluids and IV/ oral potassium started.  Elevated lactic acid at 2.3.  UA significant for proteinuria, but no signs of infection.   Suspect symptoms related to unidentified infection. Blood cultures and antibiotics ordered. Discussed case with Dr. Jeanell Sparrow who evaluated patient at bedside and agrees with assessment and plan. Discussed case with Dr. Posey Pronto with [place] who agrees to admit. COVID test ordered.  Final Clinical Impression(s) / ED Diagnoses Final diagnoses:  Generalized weakness  Hypokalemia    Rx / DC Orders ED Discharge Orders    None       Karie Kirks 06/22/20 1538    Pattricia Boss, MD 06/25/20 1242

## 2020-06-22 NOTE — ED Triage Notes (Signed)
Pt arrived via [facility] from home for c/o increased weakness, increased frequent urination, fever and chillsx3 days. Pt just completed course of oral atx for a uti. EMS gave tylenol 1000 mg about 20 mins ago. Pt is sinus w/bigeminy on monitor. Pt is A&Ox4.

## 2020-06-22 NOTE — ED Notes (Signed)
Main lab to add on Phosphorus, mag, procalcitonin, d-dimer

## 2020-06-22 NOTE — Progress Notes (Signed)
Pharmacy Antibiotic Note  Eduardo Moon is a 74 y.o. male admitted on 06/22/2020 with increased weakness, urinary frequency, and fever/chills for the past 3 days. Pharmacy has been consulted for vancomycin/cefepime dosing for sepsis. WBC 16.2. Afebrile. Scr 1.25 with current CrCl of 42 mL/min which is approximately at baseline.   Plan: Cefepime 2g IV x1 followed by cefepime 2g IV q12h  Vancomycin 1500 mg IV x1 loading dose Vancomycin 500mg  IV q12h  Monitor renal function, cultures/sensitivites, and clinical progression    Height: 5\' 6"  (167.6 cm) Weight: 69.9 kg (154 lb) IBW/kg (Calculated) : 63.8  Temp (24hrs), Avg:99.4 F (37.4 C), Min:99 F (37.2 C), Max:99.7 F (37.6 C)  Recent Labs  Lab 06/22/20 1113 06/22/20 1317  WBC 16.2*  --   CREATININE 1.35*  --   LATICACIDVEN 2.3* 2.0*    Estimated Creatinine Clearance: 42 mL/min (A) (by C-G formula based on SCr of 1.35 mg/dL (H)).    No Known Allergies  Antimicrobials this admission: Vancomycin 11/26 >>  Cefepime 11/26 >>  Dose adjustments this admission: N/a  Microbiology results: 11/26 bcx:   Thank you for allowing pharmacy to be a part of this patient's care.  Henri Medal 06/22/2020 2:09 PM

## 2020-06-22 NOTE — Progress Notes (Signed)
Pharmacy Antibiotic Note  Eduardo Moon is a 74 y.o. male admitted on 06/22/2020 with increased weakness, urinary frequency, and fever/chills for the past 3 days. Pharmacy has been consulted for vancomycin/cefepime dosing for sepsis. Now changing cefepime/flagy to zosyn. He already received first doses. WBC 16.2. Afebrile. Scr 1.25 with current CrCl of 42 mL/min which is approximately at baseline.   Plan: Zosyn 3.375gm IV Q8H (4 hr inf) Monitor renal function, cultures/sensitivites, and clinical progression    Height: 5\' 6"  (167.6 cm) Weight: 69.9 kg (154 lb) IBW/kg (Calculated) : 63.8  Temp (24hrs), Avg:99.4 F (37.4 C), Min:99 F (37.2 C), Max:99.7 F (37.6 C)  Recent Labs  Lab 06/22/20 1113 06/22/20 1317  WBC 16.2*  --   CREATININE 1.35*  --   LATICACIDVEN 2.3* 2.0*    Estimated Creatinine Clearance: 42 mL/min (A) (by C-G formula based on SCr of 1.35 mg/dL (H)).    No Known Allergies  Antimicrobials this admission: Vancomycin 11/26 >>  Zosyn 11/26>> Cefepime x 1 11/26 Flagyl x 1 11/26  Dose adjustments this admission: N/a  Microbiology results: 11/26 bcx:   Thank you for allowing pharmacy to be a part of this patient's care.  Glendine Swetz, Rande Lawman 06/22/2020 3:50 PM

## 2020-06-22 NOTE — ED Notes (Signed)
Dinner Tray Ordered @ 1658. 

## 2020-06-22 NOTE — H&P (Signed)
History and Physical    Eduardo Moon RWE:[id number] DOB: 03-Jul-1946 DOA: 06/22/2020  PCP: Timoteo Gaul, FNP    Patient coming from:  Home   Chief Complaint:  Sepsis    HPI: Eduardo Moon is a 74 y.o. 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 Pt does not meet SIR criteria , but he has wbc count lactic elevation., I suspect he has bacteriemia blood / urine cultures collected in ed.Pt was recently discharged for hospital stay form 11/15-11/19 for uti. Daughter is unhappy about dad being discharged and said it was premature and wants to make sure he is able to go home before d/c this time.  Reassured daughter we will do our best to identify the problems treat appropriately. D/w daughter with chart review dads current vitals and labs finding. No h/o anemia known to them. D/W her about AKI and also to avoid NSAID's in any way for pain and to use tylenol.     ED Course:  Vitals:   06/22/20 1400 06/22/20 1415 06/22/20 1430 06/22/20 1500  BP: (!) 159/69 (!) 145/60 (!) 160/146 139/71  Pulse: (!) 41 (!) 40 (!) 41 82  Resp: (!) 21 17 15 20   Temp:      TempSrc:      SpO2: 100% 100% 100% 100%  Weight:      Height:        Review of Systems:  Review of Systems  Constitutional: Positive for malaise/fatigue.  Musculoskeletal: Positive for joint pain.  All other systems reviewed and are negative.    Past Medical History:  Diagnosis Date  . Arthritis   . History of prostate cancer s/p radiactive seed implants  03-24-2011  . Hypertension   . Mild acid reflux watches diet  . Polio age 1  . Scrotal abscess   . 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 ANKLE RECONSTRUCTION  1957   BILATERAL  . ORCHIECTOMY  05/07/2012   Procedure: ORCHIECTOMY;  Surgeon: Bernestine Amass, MD;  Location: [facility];  Service: Urology;  Laterality: Right;  . RADIOACTIVE PROSTATE SEED IMPLANTS  04-21-2011    [facility]   PROSTATE CANCER  . SCROTAL EXPLORATION  05/07/2012   Procedure: SCROTUM EXPLORATION;  Surgeon: Bernestine Amass, MD;  Location: [facility];  Service: Urology;  Laterality: Right;  DRAINAGE OF ABSCESS  1 HR  [employer] MEDICARE     reports that he has never smoked. He has never used smokeless tobacco. He reports that he does not drink alcohol and does not use drugs.  No Known Allergies  No family history on file.  Prior to Admission medications   Medication Sig Start Date End Date Taking? Authorizing Provider  allopurinol (ZYLOPRIM) 100 MG tablet Take 100 mg by mouth daily. 05/23/20  Yes [provider]  amLODipine (NORVASC) 5 MG tablet Take 1 tablet (5 mg total) by mouth daily. 06/15/20 06/15/21 Yes Hosie Poisson, MD  blood glucose meter kit and supplies KIT Dispense based on patient and insurance preference. Use up to four times daily as directed. (FOR ICD-9 250.00, 250.01). Patient taking differently: Inject 1 each into the skin See admin instructions. Dispense based on patient and insurance preference. Use up to four times daily as directed. (FOR ICD-9 250.00, 250.01). 06/15/20  Yes Hosie Poisson, MD  diazepam (VALIUM) 5 MG tablet Take 5 mg by mouth 3 (three) times daily as needed for anxiety. 05/25/20  Yes [provider]  HYDROcodone-acetaminophen (NORCO) 7.5-325 MG tablet Take 1 tablet by mouth in the morning, at noon, and at bedtime.  05/23/20  Yes [provider]  lisinopril (ZESTRIL) 30 MG tablet Take 1 tablet (30 mg total) by mouth daily. 06/22/20  Yes Hosie Poisson, MD  metoprolol succinate (TOPROL-XL) 100 MG 24 hr tablet Take 100 mg by mouth daily. 03/22/20  Yes [provider]  omeprazole (PRILOSEC) 20 MG capsule Take 20 mg by mouth 2 (two) times daily. 04/18/20  Yes [provider]  rosuvastatin (CRESTOR) 5 MG  tablet Take 5 mg by mouth every evening.  04/23/20  Yes [provider]  senna (SENOKOT) 8.6 MG TABS tablet Take 1 tablet (8.6 mg total) by mouth 2 (two) times daily. Patient taking differently: Take 1 tablet by mouth every other day.  06/15/20  Yes Hosie Poisson, MD  Vitamin D, Ergocalciferol, (DRISDOL) 1.25 MG (50000 UNIT) CAPS capsule Take 50,000 Units by mouth once a week. 04/24/20  Yes [provider]    Physical Exam: Vitals:   06/22/20 1400 06/22/20 1415 06/22/20 1430 06/22/20 1500  BP: (!) 159/69 (!) 145/60 (!) 160/146 139/71  Pulse: (!) 41 (!) 40 (!) 41 82  Resp: (!) 21 17 15 20   Temp:      TempSrc:      SpO2: 100% 100% 100% 100%  Weight:      Height:        Physical Exam Vitals and nursing note reviewed.  Constitutional:      Appearance: Normal appearance.  HENT:     Head: Normocephalic and atraumatic.     Right Ear: External ear normal.     Left Ear: External ear normal.     Nose: Nose normal.     Mouth/Throat:     Mouth: Mucous membranes are moist.  Eyes:     Extraocular Movements: Extraocular movements intact.     Pupils: Pupils are equal, round, and reactive to light.  Cardiovascular:     Rate and Rhythm: Bradycardia present.     Pulses: Normal pulses.     Heart sounds: Normal heart sounds.  Pulmonary:     Effort: Pulmonary effort is normal.     Breath sounds: Normal breath sounds.  Abdominal:     General: Bowel sounds are normal. There is no distension.     Palpations: Abdomen is soft. There is no mass.     Tenderness: There is no abdominal tenderness. There is no guarding.  Musculoskeletal:        General: Deformity present.     Left shoulder: Decreased range of motion.     Right hand: Swelling present.     Comments: Left shoulder uniform but large in size not crepitus.  Rt 2nd MCP swelling and redness.  Rt knee has supra-patellar uniform swelling, ?prepatellar bursitis will get usg.    Skin:    General: Skin is warm.    Neurological:     Mental Status: He is alert.      Labs on Admission: I have personally reviewed following labs and imaging studies Labs  No results for input(s): CKTOTAL, CKMB, TROPONINI in the last 72 hours. Lab Results  Component Value Date   WBC 16.2 (H) 06/22/2020   HGB 9.9 (L) 06/22/2020   HCT 31.3 (L) 06/22/2020   MCV 89.9 06/22/2020   PLT 366 06/22/2020  Recent Labs  Lab 06/22/20 1113  NA 133*  K 2.9*  CL 100  CO2 19*  BUN 19  CREATININE 1.35*  CALCIUM 9.0  PROT 6.6  BILITOT 0.9  ALKPHOS 180*  ALT 85*  AST 34  GLUCOSE 207*   Lab Results  Component Value Date   CHOL 97 06/12/2020   HDL 26 (L) 06/12/2020   LDLCALC 54 06/12/2020   TRIG 87 06/12/2020   No results found for: DDIMER Invalid input(s): POCBNP  Urinalysis    Component Value Date/Time   COLORURINE YELLOW 06/22/2020 1130   APPEARANCEUR CLEAR 06/22/2020 1130   LABSPEC 1.017 06/22/2020 1130   PHURINE 5.0 06/22/2020 1130   GLUCOSEU NEGATIVE 06/22/2020 1130   HGBUR NEGATIVE 06/22/2020 1130   BILIRUBINUR NEGATIVE 06/22/2020 1130   KETONESUR NEGATIVE 06/22/2020 1130   PROTEINUR 100 (A) 06/22/2020 1130   UROBILINOGEN 1.0 04/16/2012 1950   NITRITE NEGATIVE 06/22/2020 1130   LEUKOCYTESUR NEGATIVE 06/22/2020 1130    COVID-19 Labs  No results for input(s): DDIMER, FERRITIN, LDH, CRP in the last 72 hours.  Lab Results  Component Value Date   [place] NEGATIVE 06/22/2020   [place] NEGATIVE 06/11/2020    Radiological Exams on Admission: DG Chest Portable 1 View  Result Date: 06/22/2020 CLINICAL DATA:  Fever. EXAM: PORTABLE CHEST 1 VIEW COMPARISON:  CT chest 04/14/2011.  Chest radiographs 03/19/2011. FINDINGS: Heart size within normal limits. Aortic atherosclerosis. No appreciable airspace consolidation. No evidence of pleural effusion or pneumothorax. No acute bony abnormality identified. IMPRESSION: No evidence of acute cardiopulmonary abnormality. Aortic Atherosclerosis  (ICD10-I70.0). Electronically Signed   By: Kellie Simmering DO   On: 06/22/2020 14:37    EKG: Independently reviewed.  Sins tach 104 with ventricular bigeminy.     Assessment/Plan Principal Problem:   Weakness Active Problems:   Hypokalemia   Anemia   AKI (acute kidney injury) (HCC)   Leukocytosis (leucocytosis)   HTN (hypertension)   DM2 (diabetes mellitus, type 2) (HCC)   Bradycardia   Limited mobility   Knee pain   Weakness: -attribute to hypokalemia . - will replace, follow. magnesium level is pending.  -PT consult.  -thyroid studies/ b12/ suspect vit d def will check level./ cpk pending. - ? If pt has bacteriemia causing generalized malaise or weakness.    Hypokalemia: -suspect due to poor po intake pt has not been on any diuretics. - pt is on ppi at home ? If this is magnesium def related to omeprazole.   Anemia: - anemia panel/ fecal occult. -type/ scree/ transfuse if 8 or below.  -Results for HEATHER, STREEPER (MRN [id number]) as of 06/22/2020 15:06  Ref. Range 06/11/2020 14:15 06/12/2020 03:34 06/12/2020 08:06 06/13/2020 03:27 06/14/2020 03:28 06/15/2020 03:52 06/22/2020 11:13 06/22/2020 13:17  Hemoglobin Latest Ref Range: 13.0 - 17.0 g/dL 10.4 (L)  8.4 (L) 8.3 (L)   9.9 (L)     AKI: -pt has not h/o Kidncy issues, see trend below this is acute in nature . Results for ADRIENE, PADULA (MRN [id number]) as of 06/22/2020 15:06  Ref. Range 06/11/2020 14:15 06/12/2020 03:34 06/12/2020 08:06 06/13/2020 03:27 06/14/2020 03:28 06/15/2020 03:52 06/22/2020 11:13 06/22/2020 13:17  Creatinine Latest Ref Range: 0.61 - 1.24 mg/dL 1.31 (H) 1.19  1.25 (H) 1.65 (H) 1.38 (H) 1.35 (H)   Pt was d/c OFF METFORMIN and to restart lisinopril but pt had not started taking it.  ? If this is UTI/ prostatitis will check psa eval .  Leucocytosis: -? bacteriemia , pt does not meet sirs criteria  but I suspect he has systemic infection. -cultures collected/ echo. -esr. -Results for KALIB, BHAGAT  (MRN [id number]) as of 06/22/2020 15:06  Ref. Range 06/11/2020 14:15 06/12/2020 03:34 06/12/2020 08:06 06/13/2020 03:27 06/14/2020 03:28 06/15/2020 03:52 06/22/2020 11:13 06/22/2020 13:17  WBC Latest Ref Range: 4.0 - 10.5 K/uL 19.1 (H)  11.2 (H) 9.5   16.2 (H)   Results for KEIJUAN, SCHELLHASE (MRN [id number]) as of 06/22/2020 15:06   HTN: -cont metoprolol at 50 mg and prn hydralazine.   DM II: -Glycemic protocol/ a1c.  Bradycardia/ Bigeminy on EKG: -cut down metoprolol to 50 mg he already took today's dose.  -tft/ electrolytes. - 2 d echo.- pending.  Limited mobility  -due to his h/o polio puts t at high risk fo r VTE- we will check d-dimer.  -LE venous dopplers.    Knee pain  -pain and swelling on right know. -Knee effusion in suprapatellar area.  -Will get usg. -Ortho consult per am md.   DVT prophylaxis:  Heparin    Code Status:  Full code  Family Communication:  Daughter at bedside   Disposition Plan:  TBD  Consults called:  None  Admission status: Status is: Inpatient  Remains inpatient appropriate because:Persistent severe electrolyte disturbances, Unsafe d/c plan, IV treatments appropriate due to intensity of illness or inability to take PO and Inpatient level of care appropriate due to severity of illness   Dispo: The patient is from: Home              Anticipated d/c is to: Home              Anticipated d/c date is: 3 days              Patient currently is not medically stable to d/c.   Para Skeans MD [facility] [phone] How to contact the [facility] Attending or Consulting provider [place] or covering provider during after hours [place], for this patient?    1. Check the care team in [facility] and look for a) attending/consulting [facility] provider listed and b) the [facility] team listed 2. Log into [email or web address] and use [place]'s universal password to access. If you do not have the password, please contact the hospital operator. 3. Locate the [facility] provider  you are looking for under [facility] and page to a number that you can be directly reached. 4. If you still have difficulty reaching the provider, please page the [facility] (Director on Call) for the Hospitalists listed on amion for assistance. [email or web address] Password [facility] 06/22/2020, 3:26 PM

## 2020-06-23 ENCOUNTER — Inpatient Hospital Stay (HOSPITAL_COMMUNITY): Payer: Medicare Other

## 2020-06-23 DIAGNOSIS — M7989 Other specified soft tissue disorders: Secondary | ICD-10-CM | POA: Diagnosis not present

## 2020-06-23 LAB — COMPREHENSIVE METABOLIC PANEL
ALT: 66 U/L — ABNORMAL HIGH (ref 0–44)
AST: 25 U/L (ref 15–41)
Albumin: 1.6 g/dL — ABNORMAL LOW (ref 3.5–5.0)
Alkaline Phosphatase: 147 U/L — ABNORMAL HIGH (ref 38–126)
Anion gap: 15 (ref 5–15)
BUN: 19 mg/dL (ref 8–23)
CO2: 17 mmol/L — ABNORMAL LOW (ref 22–32)
Calcium: 8.6 mg/dL — ABNORMAL LOW (ref 8.9–10.3)
Chloride: 103 mmol/L (ref 98–111)
Creatinine, Ser: 1.16 mg/dL (ref 0.61–1.24)
GFR, Estimated: >60 mL/min (ref >60)
Glucose, Bld: 115 mg/dL — ABNORMAL HIGH (ref 70–99)
Potassium: 3.2 mmol/L — ABNORMAL LOW (ref 3.5–5.1)
Sodium: 135 mmol/L (ref 135–145)
Total Bilirubin: 1 mg/dL (ref 0.3–1.2)
Total Protein: 5.7 g/dL — ABNORMAL LOW (ref 6.5–8.1)

## 2020-06-23 LAB — CBC WITH DIFFERENTIAL/PLATELET
Abs Immature Granulocytes: 0.09 10*3/uL — ABNORMAL HIGH (ref 0.00–0.07)
Basophils Absolute: 0 10*3/uL (ref 0.0–0.1)
Basophils Relative: 0 %
Eosinophils Absolute: 0 10*3/uL (ref 0.0–0.5)
Eosinophils Relative: 0 %
HCT: 26.3 % — ABNORMAL LOW (ref 39.0–52.0)
Hemoglobin: 8.5 g/dL — ABNORMAL LOW (ref 13.0–17.0)
Immature Granulocytes: 1 %
Lymphocytes Relative: 5 %
Lymphs Abs: 0.7 10*3/uL (ref 0.7–4.0)
MCH: 28.3 pg (ref 26.0–34.0)
MCHC: 32.3 g/dL (ref 30.0–36.0)
MCV: 87.7 fL (ref 80.0–100.0)
Monocytes Absolute: 1.1 10*3/uL — ABNORMAL HIGH (ref 0.1–1.0)
Monocytes Relative: 8 %
Neutro Abs: 11.7 10*3/uL — ABNORMAL HIGH (ref 1.7–7.7)
Neutrophils Relative %: 86 %
Platelets: 326 10*3/uL (ref 150–400)
RBC: 3 MIL/uL — ABNORMAL LOW (ref 4.22–5.81)
RDW: 14.6 % (ref 11.5–15.5)
WBC: 13.7 10*3/uL — ABNORMAL HIGH (ref 4.0–10.5)
nRBC: 0 % (ref 0.0–0.2)

## 2020-06-23 LAB — GLUCOSE, CAPILLARY
Glucose-Capillary: 111 mg/dL — ABNORMAL HIGH (ref 70–99)
Glucose-Capillary: 141 mg/dL — ABNORMAL HIGH (ref 70–99)
Glucose-Capillary: 118 mg/dL — ABNORMAL HIGH (ref 70–99)
Glucose-Capillary: 226 mg/dL — ABNORMAL HIGH (ref 70–99)

## 2020-06-23 LAB — CK: Total CK: 24 U/L — ABNORMAL LOW (ref 49–397)

## 2020-06-23 LAB — PROCALCITONIN: Procalcitonin: 2.63 ng/mL

## 2020-06-23 LAB — MAGNESIUM: Magnesium: 1.4 mg/dL — ABNORMAL LOW (ref 1.7–2.4)

## 2020-06-23 MED ORDER — POLYETHYLENE GLYCOL 3350 17 G PO PACK
17.0000 g | PACK | Freq: Every day | ORAL | Status: DC
  Administered 2020-06-23 – 2020-06-26 (×3): 17 g via ORAL
  Filled 2020-06-23 (×5): qty 1

## 2020-06-23 MED ORDER — FERROUS SULFATE 325 (65 FE) MG PO TABS
325.0000 mg | ORAL_TABLET | Freq: Every day | ORAL | Status: DC
  Administered 2020-06-24 – 2020-06-28 (×5): 325 mg via ORAL
  Filled 2020-06-23 (×5): qty 1

## 2020-06-23 MED ORDER — GLUCERNA SHAKE PO LIQD
237.0000 mL | Freq: Three times a day (TID) | ORAL | Status: DC
  Administered 2020-06-23 – 2020-06-28 (×11): 237 mL via ORAL

## 2020-06-23 MED ORDER — MAGNESIUM SULFATE 4 GM/100ML IV SOLN
4.0000 g | Freq: Once | INTRAVENOUS | Status: AC
  Administered 2020-06-23: 4 g via INTRAVENOUS
  Filled 2020-06-23: qty 100

## 2020-06-23 MED ORDER — BISACODYL 10 MG RE SUPP
10.0000 mg | Freq: Once | RECTAL | Status: AC
  Administered 2020-06-23: 10 mg via RECTAL
  Filled 2020-06-23: qty 1

## 2020-06-23 MED ORDER — HYDROCODONE-ACETAMINOPHEN 7.5-325 MG PO TABS
1.0000 | ORAL_TABLET | Freq: Four times a day (QID) | ORAL | Status: DC | PRN
  Administered 2020-06-23 – 2020-06-24 (×5): 1 via ORAL
  Filled 2020-06-23 (×5): qty 1

## 2020-06-23 MED ORDER — METHYLPREDNISOLONE SODIUM SUCC 40 MG IJ SOLR
40.0000 mg | Freq: Two times a day (BID) | INTRAMUSCULAR | Status: AC
  Administered 2020-06-23 – 2020-06-25 (×4): 40 mg via INTRAVENOUS
  Filled 2020-06-23 (×5): qty 1

## 2020-06-23 MED ORDER — PREDNISONE 20 MG PO TABS
40.0000 mg | ORAL_TABLET | Freq: Every day | ORAL | Status: DC
  Administered 2020-06-25 – 2020-06-28 (×4): 40 mg via ORAL
  Filled 2020-06-23 (×5): qty 2

## 2020-06-23 MED ORDER — SACCHAROMYCES BOULARDII 250 MG PO CAPS
250.0000 mg | ORAL_CAPSULE | Freq: Two times a day (BID) | ORAL | Status: DC
  Administered 2020-06-23 – 2020-06-28 (×11): 250 mg via ORAL
  Filled 2020-06-23 (×13): qty 1

## 2020-06-23 MED ORDER — POTASSIUM CHLORIDE CRYS ER 20 MEQ PO TBCR
40.0000 meq | EXTENDED_RELEASE_TABLET | Freq: Two times a day (BID) | ORAL | Status: AC
  Administered 2020-06-23 (×2): 40 meq via ORAL
  Filled 2020-06-23 (×2): qty 2

## 2020-06-23 MED ORDER — SENNOSIDES-DOCUSATE SODIUM 8.6-50 MG PO TABS
2.0000 | ORAL_TABLET | Freq: Two times a day (BID) | ORAL | Status: DC
  Administered 2020-06-23 – 2020-06-27 (×8): 2 via ORAL
  Filled 2020-06-23 (×10): qty 2

## 2020-06-23 NOTE — Progress Notes (Signed)
VASCULAR LAB    Bilateral lower extremity venous duplex has been performed.  See CV proc for preliminary results.   Farryn Linares, RVT 06/23/2020, 4:48 PM

## 2020-06-23 NOTE — Progress Notes (Signed)
PROGRESS NOTE  Eduardo Moon IFO:[id number] DOB: 08/11/1945 DOA: 06/22/2020 PCP: Timoteo Gaul, FNP  HPI/Recap of past 24 hours: Eduardo Moon is a 74 y.o. 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  Associated with increasing urination, fevers at home and chills x3 days.  Recently discharged from the hospital.  He was admitted for UTI 11/15-11/19.   He was better for a few days after leaving the hospital, but admits to worsening of symptoms over the past 2 days.  He also admits to numerous gout flares mainly affecting his right ankle right knee and right hand.  Due to concern for sepsis, blood cultures and urine cultures were collected and patient was started on IV antibiotics empirically.  06/23/20: Seen and examined at his bedside.  There were no acute events overnight.  He reports significant pain affecting his right fingers, right knee and right ankle.  He is having a severe gout flare.  Started IV Solu-Medrol 40 mg twice daily, will switch to prednisone on 06/25/2020 and taper off for total of 10 days of steroids.  Continue PPI twice daily for GI prophylactically.  Assessment/Plan: Principal Problem:   Weakness Active Problems:   HTN (hypertension)   DM2 (diabetes mellitus, type 2) (HCC)   Anemia   AKI (acute kidney injury) (HCC)   Bradycardia   Hypokalemia   Leukocytosis (leucocytosis)   Limited mobility   Knee pain  Generalized weakness suspect multifactorial secondary to dehydration, poor oral intake, and electrolyte abnormalities Albumin 1.6 on 06/23/2020 Add oral supplement and encourage increase in oral protein calorie intake. Replete electrolytes as indicated PT OT to assess Fall precautions  Severe acute gout flare affecting multiple joints, right ankle, right knee, right hand Presented with severe pain affecting  the joints mentioned above Sed rate greater than 140 Continue home allopurinol Start IV Solu-Medrol 40 mg twice daily Continue p.o. Protonix 40 mg twice daily for GI prophylaxis We will switch to prednisone and taper off on 06/25/2020 for total of 10 days of steroids.  Electrolyte abnormalities: Hypokalemia, Hypomagnesemia Presented with serum potassium 2.2, repleted orally 40 mEq x 2 doses Serum magnesium 1.4, repleted intravenously, 4 g x 1 dose.  Elevated LFTs Alkaline phosphatase, ALT elevated Denies any abdominal pain. Continue to monitor  SIRS, unclear etiology, possibly from acute gout flare, rule out active infective process. Presented with leukocytosis 16K and tachypnea with respiration rate of 29. Recently treated Enterobacter aerogenes UTI, diagnosed on 06/11/2020 from urine culture. Currently on broad-spectrum IV antibiotics Zosyn and IV vancomycin Elevated procalcitonin on admission, however denies any respiratory symptoms, personally reviewed chest x-ray which shows no evidence of lobular infiltrates Blood cultures negative to date, continue to follow Urine analysis negative for pyuria, urine culture in process, continue to follow. Monitor fever curve and WBC Obtain CBC with differentials in the morning.  Severe protein calorie malnutrition Albumin 1.6 BMI 24 Encourage increase of protein calorie intake Oral supplement 3 times daily between meals  Chronic normocytic anemia/iron deficiency anemia Hemoglobin 8.9 from 9.9 Baseline hemoglobin is 9.7 No overt bleeding Iron studies consistent with iron deficiency Start ferrous sulfate 325 mg daily  Essential hypertension Resume home oral antihypertensives Monitor vital signs  Hyperlipidemia/GERD Resume home regimen  Left-sided paresis from childhood polio No acute issues    Code Status: Full code  Family Communication: We will update his daughter  Disposition Plan:  Likely will DC to home on 06/25/2020  when clinically stable.   Consultants:  None.  Procedures:  None.  Antimicrobials:  Zosyn  IV vancomycin  DVT prophylaxis: Subcu heparin 3 times daily  Status is: Inpatient    Dispo:  Patient From: Home  Planned Disposition: Home  Expected discharge date: 06/25/20  Medically stable for discharge: No, ongoing management of severe gout flare and possible sepsis of unclear etiology.         Objective: Vitals:   06/22/20 1615 06/22/20 1630 06/22/20 1800 06/23/20 0824  BP: 135/68 131/73 (!) 148/80 (!) 151/68  Pulse: 75 74 (!) 41 88  Resp: 17 17 19 16   Temp:    99.3 F (37.4 C)  TempSrc:    Oral  SpO2: 100% 100% 99% 100%  Weight:      Height:        Intake/Output Summary (Last 24 hours) at 06/23/2020 1239 Last data filed at 06/23/2020 1050 Gross per 24 hour  Intake 100 ml  Output 1850 ml  Net -1750 ml   Filed Weights   06/22/20 1102  Weight: 69.9 kg    Exam:  . General: 74 y.o. year-old male frail-appearing.  Uncomfortable due to pain in his joints.  Alert and oriented x3. . Cardiovascular: Regular rate and rhythm with no rubs or gallops.  No thyromegaly or JVD noted.   Marland Kitchen Respiratory: Clear to auscultation with no wheezes or rales. Good inspiratory effort. . Abdomen: Soft nontender nondistended with normal bowel sounds x4 quadrants. . Musculoskeletal: Mild to moderate edema noted at his finger joints, right knee and right ankle.   Marland Kitchen Psychiatry: Mood is appropriate for condition and setting   Data Reviewed: CBC: Recent Labs  Lab 06/22/20 1113 06/23/20 0851  WBC 16.2* 13.7*  NEUTROABS 13.9* 11.7*  HGB 9.9* 8.5*  HCT 31.3* 26.3*  MCV 89.9 87.7  PLT 366 542   Basic Metabolic Panel: Recent Labs  Lab 06/22/20 1113 06/22/20 1440 06/23/20 0851  NA 133*  --  135  K 2.9*  --  3.2*  CL 100  --  103  CO2 19*  --  17*  GLUCOSE 207*  --  115*  BUN 19  --  19  CREATININE 1.35*  --  1.16  CALCIUM 9.0  --  8.6*  MG  --  1.5* 1.4*  PHOS  --   4.2  --    GFR: Estimated Creatinine Clearance: 48.9 mL/min (by C-G formula based on SCr of 1.16 mg/dL). Liver Function Tests: Recent Labs  Lab 06/22/20 1113 06/23/20 0851  AST 34 25  ALT 85* 66*  ALKPHOS 180* 147*  BILITOT 0.9 1.0  PROT 6.6 5.7*  ALBUMIN 1.9* 1.6*   No results for input(s): LIPASE, AMYLASE in the last 168 hours. No results for input(s): AMMONIA in the last 168 hours. Coagulation Profile: No results for input(s): INR, PROTIME in the last 168 hours. Cardiac Enzymes: Recent Labs  Lab 06/23/20 0349  CKTOTAL 24*   BNP (last 3 results) No results for input(s): PROBNP in the last 8760 hours. HbA1C: Recent Labs    06/22/20 1915  HGBA1C 5.7*   CBG: Recent Labs  Lab 06/22/20 2309 06/23/20 0832 06/23/20 1200  GLUCAP 122* 111* 141*   Lipid Profile: No results for input(s): CHOL, HDL, LDLCALC, TRIG, CHOLHDL, LDLDIRECT in the last 72 hours. Thyroid Function Tests: Recent Labs    06/22/20 1915  TSH 1.993  FREET4 1.07   Anemia Panel: Recent Labs  06/22/20 1440 06/22/20 1915  VITAMINB12  --  183  FOLATE  --  9.7  FERRITIN  --  1,379*  TIBC  --  127*  IRON  --  18*  RETICCTPCT 1.1  --    Urine analysis:    Component Value Date/Time   COLORURINE YELLOW 06/22/2020 1130   APPEARANCEUR CLEAR 06/22/2020 1130   LABSPEC 1.017 06/22/2020 1130   PHURINE 5.0 06/22/2020 1130   GLUCOSEU NEGATIVE 06/22/2020 1130   HGBUR NEGATIVE 06/22/2020 1130   BILIRUBINUR NEGATIVE 06/22/2020 1130   KETONESUR NEGATIVE 06/22/2020 1130   PROTEINUR 100 (A) 06/22/2020 1130   UROBILINOGEN 1.0 04/16/2012 1950   NITRITE NEGATIVE 06/22/2020 1130   LEUKOCYTESUR NEGATIVE 06/22/2020 1130   Sepsis Labs: @LABRCNTIP (procalcitonin:4,lacticidven:4)  ) Recent Results (from the past 240 hour(s))  Blood culture (routine x 2)     Status: None (Preliminary result)   Collection Time: 06/22/20 11:13 AM   Specimen: BLOOD  Result Value Ref Range Status   Specimen Description  BLOOD LEFT ANTECUBITAL  Final   Special Requests   Final    BOTTLES DRAWN AEROBIC AND ANAEROBIC Blood Culture adequate volume   Culture   Final    NO GROWTH < 24 HOURS Performed at [facility], [place] [address] [place], [place] [address]    Report Status PENDING  Incomplete  Blood culture (routine x 2)     Status: None (Preliminary result)   Collection Time: 06/22/20  2:02 PM   Specimen: BLOOD  Result Value Ref Range Status   Specimen Description BLOOD RIGHT ANTECUBITAL  Final   Special Requests   Final    BOTTLES DRAWN AEROBIC AND ANAEROBIC Blood Culture adequate volume   Culture   Final    NO GROWTH < 24 HOURS Performed at [facility], [place] [address] [place], [place] [address]    Report Status PENDING  Incomplete  Resp Panel by RT-PCR (Flu A&B, Covid) Nasopharyngeal Swab     Status: None   Collection Time: 06/22/20  2:19 PM   Specimen: Nasopharyngeal Swab; Nasopharyngeal(NP) swabs in vial transport medium  Result Value Ref Range Status   SARS Coronavirus 2 by RT PCR NEGATIVE NEGATIVE Final    Comment: (NOTE) SARS-CoV-2 target nucleic acids are NOT DETECTED.  The SARS-CoV-2 RNA is generally detectable in upper respiratory specimens during the acute phase of infection. The lowest concentration of SARS-CoV-2 viral copies this assay can detect is 138 copies/mL. A negative result does not preclude SARS-Cov-2 infection and should not be used as the sole basis for treatment or other patient management decisions. A negative result may occur with  improper specimen collection/handling, submission of specimen other than nasopharyngeal swab, presence of viral mutation(s) within the areas targeted by this assay, and inadequate number of viral copies(<138 copies/mL). A negative result must be combined with clinical observations, patient history, and epidemiological information. The expected result is Negative.  Fact Sheet for Patients:    EntrepreneurPulse.com.au  Fact Sheet for Healthcare Providers:  IncredibleEmployment.be  This test is no t yet approved or cleared by the [place] FDA and  has been authorized for detection and/or diagnosis of SARS-CoV-2 by FDA under an Emergency Use Authorization (EUA). This EUA will remain  in effect (meaning this test can be used) for the duration of the COVID-19 declaration under Section 564(b)(1) of the Act, 21 U.S.C.section 360bbb-3(b)(1), unless the authorization is terminated  or revoked sooner.       Influenza A by PCR NEGATIVE  NEGATIVE Final   Influenza B by PCR NEGATIVE NEGATIVE Final    Comment: (NOTE) The Xpert Xpress SARS-CoV-2/FLU/RSV plus assay is intended as an aid in the diagnosis of influenza from Nasopharyngeal swab specimens and should not be used as a sole basis for treatment. Nasal washings and aspirates are unacceptable for Xpert Xpress SARS-CoV-2/FLU/RSV testing.  Fact Sheet for Patients: EntrepreneurPulse.com.au  Fact Sheet for Healthcare Providers: IncredibleEmployment.be  This test is not yet approved or cleared by the [place] FDA and has been authorized for detection and/or diagnosis of SARS-CoV-2 by FDA under an Emergency Use Authorization (EUA). This EUA will remain in effect (meaning this test can be used) for the duration of the COVID-19 declaration under Section 564(b)(1) of the Act, 21 U.S.C. section 360bbb-3(b)(1), unless the authorization is terminated or revoked.  Performed at [facility], [place] [address] [place], [place] [address]       Studies: [place] LIMITED JOINT SPACE STRUCTURES LOW RIGHT  Result Date: 06/22/2020 CLINICAL DATA:  74 year old male with right knee swelling. EXAM: ULTRASOUND right LOWER EXTREMITY LIMITED TECHNIQUE: Ultrasound examination of the lower extremity soft tissues was performed in the area of clinical concern.  COMPARISON:  None. FINDINGS: There is a 9.3 x 5.0 x 1.2 cm complex collection in the anterior distal thigh above the knee. No internal vascularity identified within this collection. This likely represents a complex suprapatellar effusion versus possibly an intramuscular collection or sequela of prior hemorrhage. Radiograph of the right knee may provide additional information. No hyperemia noted in the surrounding tissue. IMPRESSION: Complex collection in the soft tissues of the anterior distal thigh versus a complex suprapatellar effusion. Radiograph may provide additional information. Electronically Signed   By: Anner Crete M.D.   On: 06/22/2020 19:00   DG Chest Portable 1 View  Result Date: 06/22/2020 CLINICAL DATA:  Fever. EXAM: PORTABLE CHEST 1 VIEW COMPARISON:  CT chest 04/14/2011.  Chest radiographs 03/19/2011. FINDINGS: Heart size within normal limits. Aortic atherosclerosis. No appreciable airspace consolidation. No evidence of pleural effusion or pneumothorax. No acute bony abnormality identified. IMPRESSION: No evidence of acute cardiopulmonary abnormality. Aortic Atherosclerosis (ICD10-I70.0). Electronically Signed   By: Kellie Simmering DO   On: 06/22/2020 14:37   ECHOCARDIOGRAM COMPLETE  Result Date: 06/22/2020    ECHOCARDIOGRAM REPORT   Patient Name:   JAZON JIPSON Date of Exam: 06/22/2020 Medical Rec #:  [id number]     Height:       66.0 in Accession #:    [id number]    Weight:       154.0 lb Date of Birth:  1945-12-11     BSA:          1.790 m Patient Age:    28 years      BP:           139/71 mmHg Patient Gender: M             HR:           81 bpm. Exam Location:  Inpatient Procedure: 2D Echo, Cardiac Doppler and Color Doppler Indications:    I10 Hypertension  History:        Patient has no prior history of Echocardiogram examinations.                 GERD. Cancer.  Sonographer:    Jonelle Sidle Dance Referring Phys: [id number] EKTA V PATEL  Sonographer Comments: No subcostal window. IMPRESSIONS   1. Left ventricular ejection fraction, by estimation, is 60 to  65%. The left ventricle has normal function. The left ventricle has no regional wall motion abnormalities. Left ventricular diastolic parameters are consistent with Grade I diastolic dysfunction (impaired relaxation). Elevated left atrial pressure.  2. Right ventricular systolic function is normal. The right ventricular size is normal.  3. The mitral valve is normal in structure. Mild to moderate mitral valve regurgitation. No evidence of mitral stenosis.  4. The aortic valve is normal in structure. Aortic valve regurgitation is not visualized. No aortic stenosis is present.  5. The inferior vena cava is normal in size with greater than 50% respiratory variability, suggesting right atrial pressure of 3 mmHg. FINDINGS  Left Ventricle: Left ventricular ejection fraction, by estimation, is 60 to 65%. The left ventricle has normal function. The left ventricle has no regional wall motion abnormalities. The left ventricular internal cavity size was normal in size. There is  no left ventricular hypertrophy. Left ventricular diastolic parameters are consistent with Grade I diastolic dysfunction (impaired relaxation). Elevated left atrial pressure. Right Ventricle: The right ventricular size is normal. No increase in right ventricular wall thickness. Right ventricular systolic function is normal. Left Atrium: Left atrial size was normal in size. Right Atrium: Right atrial size was normal in size. Pericardium: There is no evidence of pericardial effusion. Mitral Valve: The mitral valve is normal in structure. Mild to moderate mitral valve regurgitation, with centrally-directed jet. No evidence of mitral valve stenosis. Tricuspid Valve: The tricuspid valve is normal in structure. Tricuspid valve regurgitation is not demonstrated. No evidence of tricuspid stenosis. Aortic Valve: The aortic valve is normal in structure. Aortic valve regurgitation is not visualized.  No aortic stenosis is present. Pulmonic Valve: The pulmonic valve was normal in structure. Pulmonic valve regurgitation is not visualized. No evidence of pulmonic stenosis. Aorta: The aortic root is normal in size and structure. Venous: The inferior vena cava is normal in size with greater than 50% respiratory variability, suggesting right atrial pressure of 3 mmHg. IAS/Shunts: No atrial level shunt detected by color flow Doppler.  LEFT VENTRICLE PLAX 2D LVIDd:         4.27 cm  Diastology LVIDs:         3.19 cm  LV e' medial:    4.46 cm/s LV PW:         0.98 cm  LV E/e' medial:  18.2 LV IVS:        0.99 cm  LV e' lateral:   4.90 cm/s LVOT diam:     2.00 cm  LV E/e' lateral: 16.5 LV SV:         81 LV SV Index:   45 LVOT Area:     3.14 cm  RIGHT VENTRICLE RV Basal diam:  3.32 cm RV Mid diam:    2.13 cm RV S prime:     11.20 cm/s TAPSE (M-mode): 2.1 cm LEFT ATRIUM             Index       RIGHT ATRIUM           Index LA diam:        3.60 cm 2.01 cm/m  RA Area:     16.40 cm LA Vol (A2C):   58.9 ml 32.91 ml/m RA Volume:   44.90 ml  25.09 ml/m LA Vol (A4C):   65.7 ml 36.71 ml/m LA Biplane Vol: 62.8 ml 35.09 ml/m  AORTIC VALVE LVOT Vmax:   131.00 cm/s LVOT Vmean:  85.350 cm/s LVOT VTI:    0.258  m  AORTA Ao Root diam: 3.20 cm Ao Asc diam:  2.80 cm MITRAL VALVE MV Area (PHT): 3.37 cm    SHUNTS MV Decel Time: 225 msec    Systemic VTI:  0.26 m MV E velocity: 81.00 cm/s  Systemic Diam: 2.00 cm MV A velocity: 92.60 cm/s MV E/A ratio:  0.87 Mihai Croitoru MD Electronically signed by Sanda Klein MD Signature Date/Time: 06/22/2020/4:29:33 PM    Final     Scheduled Meds: . allopurinol  100 mg Oral Daily  . heparin  5,000 Units Subcutaneous Q8H  . insulin aspart  0-15 Units Subcutaneous TID WC  . insulin aspart  0-5 Units Subcutaneous QHS  . methylPREDNISolone (SOLU-MEDROL) injection  40 mg Intravenous Q12H  . metoprolol succinate  50 mg Oral Daily  . pantoprazole (PROTONIX) IV  40 mg Intravenous Q12H  .  potassium chloride  40 mEq Oral BID  . [START ON 06/25/2020] predniSONE  40 mg Oral Q breakfast  . rosuvastatin  5 mg Oral QPM    Continuous Infusions: . magnesium sulfate bolus IVPB    . piperacillin-tazobactam (ZOSYN)  IV 3.375 g (06/23/20 0547)  . vancomycin 500 mg (06/23/20 0322)     LOS: 1 day     Kayleen Memos, MD [facility] Pager [phone]  If 7PM-7AM, please contact night-coverage [email or web address] Password [facility] 06/23/2020, 12:39 PM

## 2020-06-23 NOTE — Progress Notes (Signed)
PT Cancellation Note  Patient Details Name: Eduardo Moon MRN: [id number] DOB: [birth year]   Cancelled Treatment:    Reason Eval/Treat Not Completed: Medical issues which prohibited therapy awaiting results of dopplers for DVT r/o. Will attempt to return if time/schedule allow.    Windell Norfolk, DPT, PN1   Supplemental Physical Therapist [facility]    Pager [phone] Acute Rehab Office [phone]

## 2020-06-24 LAB — URINE CULTURE: Culture: NO GROWTH

## 2020-06-24 LAB — GLUCOSE, CAPILLARY
Glucose-Capillary: 182 mg/dL — ABNORMAL HIGH (ref 70–99)
Glucose-Capillary: 213 mg/dL — ABNORMAL HIGH (ref 70–99)
Glucose-Capillary: 198 mg/dL — ABNORMAL HIGH (ref 70–99)
Glucose-Capillary: 274 mg/dL — ABNORMAL HIGH (ref 70–99)

## 2020-06-24 LAB — CBC WITH DIFFERENTIAL/PLATELET
Abs Immature Granulocytes: 0.09 10*3/uL — ABNORMAL HIGH (ref 0.00–0.07)
Basophils Absolute: 0 10*3/uL (ref 0.0–0.1)
Basophils Relative: 0 %
Eosinophils Absolute: 0 10*3/uL (ref 0.0–0.5)
Eosinophils Relative: 0 %
HCT: 28.3 % — ABNORMAL LOW (ref 39.0–52.0)
Hemoglobin: 9.2 g/dL — ABNORMAL LOW (ref 13.0–17.0)
Immature Granulocytes: 1 %
Lymphocytes Relative: 4 %
Lymphs Abs: 0.5 10*3/uL — ABNORMAL LOW (ref 0.7–4.0)
MCH: 28.5 pg (ref 26.0–34.0)
MCHC: 32.5 g/dL (ref 30.0–36.0)
MCV: 87.6 fL (ref 80.0–100.0)
Monocytes Absolute: 0.3 10*3/uL (ref 0.1–1.0)
Monocytes Relative: 3 %
Neutro Abs: 10.9 10*3/uL — ABNORMAL HIGH (ref 1.7–7.7)
Neutrophils Relative %: 92 %
Platelets: 413 10*3/uL — ABNORMAL HIGH (ref 150–400)
RBC: 3.23 MIL/uL — ABNORMAL LOW (ref 4.22–5.81)
RDW: 14.6 % (ref 11.5–15.5)
WBC: 11.8 10*3/uL — ABNORMAL HIGH (ref 4.0–10.5)
nRBC: 0 % (ref 0.0–0.2)

## 2020-06-24 LAB — COMPREHENSIVE METABOLIC PANEL
ALT: 84 U/L — ABNORMAL HIGH (ref 0–44)
AST: 36 U/L (ref 15–41)
Albumin: 1.6 g/dL — ABNORMAL LOW (ref 3.5–5.0)
Alkaline Phosphatase: 167 U/L — ABNORMAL HIGH (ref 38–126)
Anion gap: 15 (ref 5–15)
BUN: 26 mg/dL — ABNORMAL HIGH (ref 8–23)
CO2: 17 mmol/L — ABNORMAL LOW (ref 22–32)
Calcium: 8.7 mg/dL — ABNORMAL LOW (ref 8.9–10.3)
Chloride: 102 mmol/L (ref 98–111)
Creatinine, Ser: 1.31 mg/dL — ABNORMAL HIGH (ref 0.61–1.24)
GFR, Estimated: 56 mL/min — ABNORMAL LOW (ref >60)
Glucose, Bld: 206 mg/dL — ABNORMAL HIGH (ref 70–99)
Potassium: 4.1 mmol/L (ref 3.5–5.1)
Sodium: 134 mmol/L — ABNORMAL LOW (ref 135–145)
Total Bilirubin: 0.6 mg/dL (ref 0.3–1.2)
Total Protein: 6.2 g/dL — ABNORMAL LOW (ref 6.5–8.1)

## 2020-06-24 LAB — MRSA PCR SCREENING: MRSA by PCR: NEGATIVE

## 2020-06-24 LAB — MAGNESIUM: Magnesium: 2.3 mg/dL (ref 1.7–2.4)

## 2020-06-24 LAB — PROCALCITONIN: Procalcitonin: 1.85 ng/mL

## 2020-06-24 LAB — URIC ACID: Uric Acid, Serum: 3.5 mg/dL — ABNORMAL LOW (ref 3.7–8.6)

## 2020-06-24 MED ORDER — TRAMADOL HCL 50 MG PO TABS
50.0000 mg | ORAL_TABLET | Freq: Two times a day (BID) | ORAL | Status: DC | PRN
  Administered 2020-06-24 – 2020-06-28 (×5): 50 mg via ORAL
  Filled 2020-06-24 (×6): qty 1

## 2020-06-24 MED ORDER — HYDROCORTISONE (PERIANAL) 2.5 % EX CREA
1.0000 "application " | TOPICAL_CREAM | Freq: Four times a day (QID) | CUTANEOUS | Status: DC | PRN
  Administered 2020-06-24 (×2): 1 via TOPICAL
  Filled 2020-06-24 (×2): qty 28.35

## 2020-06-24 MED ORDER — VITAMIN B-12 100 MCG PO TABS
500.0000 ug | ORAL_TABLET | Freq: Every day | ORAL | Status: DC
  Administered 2020-06-24 – 2020-06-28 (×5): 500 ug via ORAL
  Filled 2020-06-24 (×5): qty 5

## 2020-06-24 MED ORDER — CYANOCOBALAMIN 1000 MCG/ML IJ SOLN
1000.0000 ug | Freq: Once | INTRAMUSCULAR | Status: AC
  Administered 2020-06-24: 1000 ug via INTRAMUSCULAR
  Filled 2020-06-24: qty 1

## 2020-06-24 MED ORDER — STERILE WATER FOR INJECTION IV SOLN
INTRAVENOUS | Status: AC
  Filled 2020-06-24 (×2): qty 850

## 2020-06-24 NOTE — Progress Notes (Signed)
PROGRESS NOTE  [name] MAU:[id number] DOB: Jun 17, 1946 DOA: 06/22/2020 PCP: Eduardo Gaul, FNP  HPI/Recap of past 24 hours: Eduardo Moon is a 74 y.o. 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  Associated with increasing urination, fevers at home and chills x3 days.  Recently discharged from the hospital.  He was admitted for UTI 11/15-11/19.   He was better for a few days after leaving the hospital, but admits to worsening of symptoms over the past 2 days.  He also admits to numerous gout flares mainly affecting his right ankle right knee and right hand.  Due to concern for sepsis, blood cultures and urine cultures were collected and patient was started on IV antibiotics empirically.  He reports significant pain affecting his right fingers, right knee and right ankle.  He is having a severe gout flare.  Started IV Solu-Medrol 40 mg twice daily, will switch to prednisone on 06/25/2020 and taper off for total of 10 days of steroids.  Continue PPI twice daily for GI prophylactically.   06/24/20: His gout flare is improved this am.  No significant pain from his joints after starting IV steroids on 11/27.  Assessment/Plan: Principal Problem:   Weakness Active Problems:   HTN (hypertension)   DM2 (diabetes mellitus, type 2) (HCC)   Anemia   AKI (acute kidney injury) (HCC)   Bradycardia   Hypokalemia   Leukocytosis (leucocytosis)   Limited mobility   Knee pain  Generalized weakness suspect multifactorial secondary to dehydration, poor oral intake, and electrolyte abnormalities Albumin 1.6 on 06/23/2020 Continue oral supplement and encourage increase in oral protein calorie intake. Replete electrolytes as indicated PT OT to assess Fall precautions  Improving Severe acute gout flare affecting multiple joints, right ankle, right  knee, right hand Presented with severe pain affecting the joints mentioned above Sed rate greater than 140 Obtain uric acid level Continue home allopurinol Continue IV Solu-Medrol 40 mg twice daily Continue p.o. Protonix 40 mg twice daily for GI prophylaxis We will switch to prednisone and taper off on 06/25/2020 for total of 10 days of steroids.  Anion gap metabolic acidosis Presented with serum bicarb of 17 with anion gap of 15, lactic acid 2.0 from 2.3 Started isotonic bicarb on 06/24/2020, continue Closely monitor potassium level while on isotonic bicarb. Repeat renal panel in the morning  Resolved post repletion: Electrolyte abnormalities: Hypokalemia, Hypomagnesemia Presented with serum potassium 3.2, repleted orally 40 mEq x 2 doses, repeated 4.1 Serum magnesium 1.4, repleted intravenously, 4 g x 1 dose, repeated 2.3  Elevated LFTs Alkaline phosphatase, ALT elevated Denies any abdominal pain. Continue to monitor  SIRS, unclear etiology, possibly from acute gout flare, rule out active infective process. Presented with leukocytosis 16K and tachypnea with respiration rate of 29. Recently treated Enterobacter aerogenes UTI, diagnosed on 06/11/2020 from urine culture. Currently on broad-spectrum IV antibiotics Zosyn and IV vancomycin Obtain MRSA screening, if negative DC IV vancomycin. Elevated procalcitonin on admission, however denies any respiratory symptoms, personally reviewed chest x-ray which shows no evidence of lobular infiltrates Procalcitonin downtrending. Blood cultures negative to date, continue to follow Urine analysis negative for pyuria, urine culture in process, continue to follow. Monitor fever curve and WBC WBC downtrending.  Afebrile.  Severe protein calorie malnutrition Albumin 1.6 BMI 24 Encourage increase of protein calorie intake Oral supplement 3 times daily between meals  Chronic normocytic  anemia/iron deficiency anemia Hemoglobin uptrending 9.2  from 8.9\ Continue iron supplement, started on 06/23/2020.Marland Kitchen Baseline hemoglobin is 9.7 No overt bleeding Iron studies consistent with iron deficiency  Essential hypertension Continue home oral antihypertensives Monitor vital signs  Hyperlipidemia/GERD Continue home regimen  Left-sided paresis from childhood polio No acute issues    Code Status: Full code  Family Communication: Updated the patient's daughter via phone on 06/24/2020.  Disposition Plan: Likely will DC to home on 06/25/2020 when clinically stable.   Consultants:  None.  Procedures:  None.  Antimicrobials:  Zosyn  IV vancomycin  DVT prophylaxis: Subcu heparin 3 times daily  Status is: Inpatient    Dispo:  Patient From: Home  Planned Disposition: Home  Expected discharge date: 06/25/20  Medically stable for discharge: No, ongoing management of severe gout flare and possible sepsis of unclear etiology.         Objective: Vitals:   06/22/20 1630 06/22/20 1800 06/23/20 0824 06/23/20 1951  BP: 131/73 (!) 148/80 (!) 151/68 (!) 151/75  Pulse: 74 (!) 41 88 88  Resp: 17 19 16 19   Temp:   99.3 F (37.4 C) 98.1 F (36.7 C)  TempSrc:   Oral Oral  SpO2: 100% 99% 100% 100%  Weight:      Height:        Intake/Output Summary (Last 24 hours) at 06/24/2020 1048 Last data filed at 06/23/2020 2220 Gross per 24 hour  Intake 510 ml  Output 1450 ml  Net -940 ml   Filed Weights   06/22/20 1102  Weight: 69.9 kg    Exam:  . General: 75 y.o. year-old male frail-appearing no acute distress.  Alert and wanted x3.   . Cardiovascular: Regular rate and rhythm no rubs or gallops.  Marland Kitchen Respiratory: Clear to auscultation no wheezes no rales . Abdomen: [Nontender.  Normal bowel sounds present.   . Musculoskeletal: Mild edema in lower extremities bilaterally . Psychiatry: Mood is appropriate for condition and setting.   Data Reviewed: CBC: Recent Labs  Lab 06/22/20 1113 06/23/20 0851  06/24/20 0207  WBC 16.2* 13.7* 11.8*  NEUTROABS 13.9* 11.7* 10.9*  HGB 9.9* 8.5* 9.2*  HCT 31.3* 26.3* 28.3*  MCV 89.9 87.7 87.6  PLT 366 326 836*   Basic Metabolic Panel: Recent Labs  Lab 06/22/20 1113 06/22/20 1440 06/23/20 0851 06/24/20 0207  NA 133*  --  135 134*  K 2.9*  --  3.2* 4.1  CL 100  --  103 102  CO2 19*  --  17* 17*  GLUCOSE 207*  --  115* 206*  BUN 19  --  19 26*  CREATININE 1.35*  --  1.16 1.31*  CALCIUM 9.0  --  8.6* 8.7*  MG  --  1.5* 1.4* 2.3  PHOS  --  4.2  --   --    GFR: Estimated Creatinine Clearance: 43.3 mL/min (A) (by C-G formula based on SCr of 1.31 mg/dL (H)). Liver Function Tests: Recent Labs  Lab 06/22/20 1113 06/23/20 0851 06/24/20 0207  AST 34 25 36  ALT 85* 66* 84*  ALKPHOS 180* 147* 167*  BILITOT 0.9 1.0 0.6  PROT 6.6 5.7* 6.2*  ALBUMIN 1.9* 1.6* 1.6*   No results for input(s): LIPASE, AMYLASE in the last 168 hours. No results for input(s): AMMONIA in the last 168 hours. Coagulation Profile: No results for input(s): INR, PROTIME in the last 168 hours. Cardiac Enzymes: Recent Labs  Lab 06/23/20 0349  CKTOTAL 24*   BNP (last 3 results)  No results for input(s): PROBNP in the last 8760 hours. HbA1C: Recent Labs    06/22/20 1915  HGBA1C 5.7*   CBG: Recent Labs  Lab 06/23/20 0832 06/23/20 1200 06/23/20 1742 06/23/20 2116 06/24/20 0742  GLUCAP 111* 141* 118* 226* 182*   Lipid Profile: No results for input(s): CHOL, HDL, LDLCALC, TRIG, CHOLHDL, LDLDIRECT in the last 72 hours. Thyroid Function Tests: Recent Labs    06/22/20 1915  TSH 1.993  FREET4 1.07   Anemia Panel: Recent Labs    06/22/20 1440 06/22/20 1915  VITAMINB12  --  183  FOLATE  --  9.7  FERRITIN  --  1,379*  TIBC  --  127*  IRON  --  18*  RETICCTPCT 1.1  --    Urine analysis:    Component Value Date/Time   COLORURINE YELLOW 06/22/2020 1130   APPEARANCEUR CLEAR 06/22/2020 1130   LABSPEC 1.017 06/22/2020 1130   PHURINE 5.0 06/22/2020  1130   GLUCOSEU NEGATIVE 06/22/2020 1130   HGBUR NEGATIVE 06/22/2020 1130   BILIRUBINUR NEGATIVE 06/22/2020 1130   KETONESUR NEGATIVE 06/22/2020 1130   PROTEINUR 100 (A) 06/22/2020 1130   UROBILINOGEN 1.0 04/16/2012 1950   NITRITE NEGATIVE 06/22/2020 1130   LEUKOCYTESUR NEGATIVE 06/22/2020 1130   Sepsis Labs: @LABRCNTIP (procalcitonin:4,lacticidven:4)  ) Recent Results (from the past 240 hour(s))  Blood culture (routine x 2)     Status: None (Preliminary result)   Collection Time: 06/22/20 11:13 AM   Specimen: BLOOD  Result Value Ref Range Status   Specimen Description BLOOD LEFT ANTECUBITAL  Final   Special Requests   Final    BOTTLES DRAWN AEROBIC AND ANAEROBIC Blood Culture adequate volume   Culture   Final    NO GROWTH 2 DAYS Performed at [facility], [place] [address], [place], [place] [address]    Report Status PENDING  Incomplete  Urine culture     Status: None   Collection Time: 06/22/20 11:30 AM   Specimen: Urine, Random  Result Value Ref Range Status   Specimen Description URINE, RANDOM  Final   Special Requests NONE  Final   Culture   Final    NO GROWTH Performed at [facility], [address], [place], [place] [address]    Report Status 06/24/2020 FINAL  Final  Blood culture (routine x 2)     Status: None (Preliminary result)   Collection Time: 06/22/20  2:02 PM   Specimen: BLOOD  Result Value Ref Range Status   Specimen Description BLOOD RIGHT ANTECUBITAL  Final   Special Requests   Final    BOTTLES DRAWN AEROBIC AND ANAEROBIC Blood Culture adequate volume   Culture   Final    NO GROWTH 2 DAYS Performed at [facility], [place] [address], [place],  [address]    Report Status PENDING  Incomplete  Resp Panel by RT-PCR (Flu A&B, Covid) Nasopharyngeal Swab     Status: None   Collection Time: 06/22/20  2:19 PM   Specimen: Nasopharyngeal Swab; Nasopharyngeal(NP) swabs in vial transport medium  Result Value Ref Range Status   SARS  Coronavirus 2 by RT PCR NEGATIVE NEGATIVE Final    Comment: (NOTE) SARS-CoV-2 target nucleic acids are NOT DETECTED.  The SARS-CoV-2 RNA is generally detectable in upper respiratory specimens during the acute phase of infection. The lowest concentration of SARS-CoV-2 viral copies this assay can detect is 138 copies/mL. A negative result does not preclude SARS-Cov-2 infection and should not be used as the  sole basis for treatment or other patient management decisions. A negative result may occur with  improper specimen collection/handling, submission of specimen other than nasopharyngeal swab, presence of viral mutation(s) within the areas targeted by this assay, and inadequate number of viral copies(<138 copies/mL). A negative result must be combined with clinical observations, patient history, and epidemiological information. The expected result is Negative.  Fact Sheet for Patients:  EntrepreneurPulse.com.au  Fact Sheet for Healthcare Providers:  IncredibleEmployment.be  This test is no t yet approved or cleared by the [place] FDA and  has been authorized for detection and/or diagnosis of SARS-CoV-2 by FDA under an Emergency Use Authorization (EUA). This EUA will remain  in effect (meaning this test can be used) for the duration of the COVID-19 declaration under Section 564(b)(1) of the Act, 21 U.S.C.section 360bbb-3(b)(1), unless the authorization is terminated  or revoked sooner.       Influenza A by PCR NEGATIVE NEGATIVE Final   Influenza B by PCR NEGATIVE NEGATIVE Final    Comment: (NOTE) The Xpert Xpress SARS-CoV-2/FLU/RSV plus assay is intended as an aid in the diagnosis of influenza from Nasopharyngeal swab specimens and should not be used as a sole basis for treatment. Nasal washings and aspirates are unacceptable for Xpert Xpress SARS-CoV-2/FLU/RSV testing.  Fact Sheet for  Patients: EntrepreneurPulse.com.au  Fact Sheet for Healthcare Providers: IncredibleEmployment.be  This test is not yet approved or cleared by the [place] FDA and has been authorized for detection and/or diagnosis of SARS-CoV-2 by FDA under an Emergency Use Authorization (EUA). This EUA will remain in effect (meaning this test can be used) for the duration of the COVID-19 declaration under Section 564(b)(1) of the Act, 21 U.S.C. section 360bbb-3(b)(1), unless the authorization is terminated or revoked.  Performed at [facility], [place] [address], [place], [place] [address]       Studies: VAS [place] LOWER EXTREMITY VENOUS (DVT)  Result Date: 06/23/2020  Lower Venous DVT Study Indications: Swelling. Other Indications: Gout flare. Risk Factors: History of Polio. Limitations: Orthopaedic appliance. Comparison Study: No prior study Performing Technologist: Sharion Dove RVS  Examination Guidelines: A complete evaluation includes B-mode imaging, spectral Doppler, color Doppler, and power Doppler as needed of all accessible portions of each vessel. Bilateral testing is considered an integral part of a complete examination. Limited examinations for reoccurring indications may be performed as noted. The reflux portion of the exam is performed with the patient in reverse Trendelenburg.  +---------+---------------+---------+-----------+----------+--------------+ RIGHT    CompressibilityPhasicitySpontaneityPropertiesThrombus Aging +---------+---------------+---------+-----------+----------+--------------+ CFV      Full           Yes      Yes                                 +---------+---------------+---------+-----------+----------+--------------+ SFJ      Full                                                        +---------+---------------+---------+-----------+----------+--------------+ FV Prox  Full                                                         +---------+---------------+---------+-----------+----------+--------------+  FV Mid   Full                                                        +---------+---------------+---------+-----------+----------+--------------+ FV DistalFull                                                        +---------+---------------+---------+-----------+----------+--------------+ PFV      Full                                                        +---------+---------------+---------+-----------+----------+--------------+ POP      Full           Yes      Yes                                 +---------+---------------+---------+-----------+----------+--------------+ PTV      Full                                                        +---------+---------------+---------+-----------+----------+--------------+ PERO     Full                                                        +---------+---------------+---------+-----------+----------+--------------+   +---------+---------------+---------+-----------+----------+-------------------+ LEFT     CompressibilityPhasicitySpontaneityPropertiesThrombus Aging      +---------+---------------+---------+-----------+----------+-------------------+ CFV                                                   Not well visualized +---------+---------------+---------+-----------+----------+-------------------+ SFJ                                                   Not well visualized +---------+---------------+---------+-----------+----------+-------------------+ FV Prox  Full                                                             +---------+---------------+---------+-----------+----------+-------------------+ FV Mid  patent with color                                                         and Doppler          +---------+---------------+---------+-----------+----------+-------------------+ FV Distal                                             patent with color                                                         and Doppler         +---------+---------------+---------+-----------+----------+-------------------+ PFV      Full                                                             +---------+---------------+---------+-----------+----------+-------------------+ POP                                                   Not well visualized +---------+---------------+---------+-----------+----------+-------------------+ PERO                                                  patent by color     +---------+---------------+---------+-----------+----------+-------------------+ Soleal                                                patent by color     +---------+---------------+---------+-----------+----------+-------------------+   Summary: RIGHT: - There is no evidence of deep vein thrombosis in the lower extremity.  LEFT: - There is no evidence of deep vein thrombosis in the lower extremity. However, portions of this examination were limited- see technologist comments above.  *See table(s) above for measurements and observations.    Preliminary     Scheduled Meds: . allopurinol  100 mg Oral Daily  . feeding supplement (GLUCERNA SHAKE)  237 mL Oral TID BM  . ferrous sulfate  325 mg Oral Q breakfast  . heparin  5,000 Units Subcutaneous Q8H  . insulin aspart  0-15 Units Subcutaneous TID WC  . insulin aspart  0-5 Units Subcutaneous QHS  . methylPREDNISolone (SOLU-MEDROL) injection  40 mg Intravenous Q12H  . metoprolol succinate  50 mg Oral Daily  . pantoprazole (PROTONIX) IV  40 mg Intravenous Q12H  . polyethylene glycol  17 g Oral Daily  . [START ON 06/25/2020] predniSONE  40 mg Oral Q breakfast  . rosuvastatin  5 mg Oral QPM  .  saccharomyces boulardii  250 mg Oral BID  .  senna-docusate  2 tablet Oral BID    Continuous Infusions: . piperacillin-tazobactam (ZOSYN)  IV 3.375 g (06/24/20 0644)  .  sodium bicarbonate (isotonic) infusion in sterile water 75 mL/hr at 06/24/20 1015  . vancomycin 500 mg (06/24/20 0403)     LOS: 2 days     Kayleen Memos, MD [facility] Pager [phone]  If 7PM-7AM, please contact night-coverage [email or web address] Password [facility] 06/24/2020, 10:48 AM

## 2020-06-24 NOTE — Evaluation (Signed)
Physical Therapy Evaluation Patient Details Name: Eduardo Moon MRN: [id number] DOB: [birth year] Today's Date: 06/24/2020   History of Present Illness  74 y.o. male admitted on 06/22/20 for infection. Pt recently admitted from 11/15-11/19/21 for UTI.  This admission working dx of generalized weakness multifactoria from dehydration, poor po intake and electrolyte abnormalaties, severe acute gout flare multiple joints R side, anion gap metabloic acidosis, SIRS of unclear etiology.  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   Clinical Impression  Limited bed level assessment complete. Pt is very limited by gout pain in multiple joints on his right "good" side.  Left side weak from childhood polio.  Daughter in room at end of session discussing her concerns re his ongoing medical workup and relaying her perspective of her dad, how he was PTA and that this is "not my dad".  PT will continue to follow along acutely to assess tolerance of mobility and therapy.  He wants to transport home with ambulance if he cannot walk before he leaves here and to resume home therapy that was in place PTA.   PT to follow acutely for deficits listed below.    Follow Up Recommendations Home health PT;Supervision/Assistance - 24 hour (pt was active with [facility] therapy)    Equipment Recommendations  Other (comment) (ambulance transport home at d/c if he cannot walk)    Recommendations for Other Services       Precautions / Restrictions Precautions Precautions: Fall Precaution Comments: left sided weakness, R sided gout flare/pain Required Braces or Orthoses: Other Brace Other Brace: L KAFO- currently in the process of getting a new one from [employer]      Mobility  Bed Mobility Overal bed mobility: Needs Assistance             General bed mobility  comments: Repositioned pt higher in the bed and he was unable to help at all, so total assist with bed in max trendelenberg.      Transfers                 General transfer comment: Pt unable to try today.   Ambulation/Gait                Stairs            Wheelchair Mobility    Modified Rankin (Stroke Patients Only)       Balance                                             Pertinent Vitals/Pain Pain Assessment: Faces Faces Pain Scale: Hurts even more Pain Location: although he reports gout pain, the most severe pain he is having currently is as he is trying to have a BM (abdominal pain/hemrrhoid pain).  Pain Descriptors / Indicators: Grimacing;Guarding Pain Intervention(s): Limited activity within patient's tolerance;Monitored during session;Repositioned    Home Living Family/patient expects to be discharged to:: Private residence Living Arrangements: Children Available Help at Discharge: Family;Available 24 hours/day Type of Home: House Home Access: Stairs to enter   [employer] of Steps: 3 Home Layout: One level Home Equipment: Crutches;Wheelchair - [employer];Hospital bed Additional Comments: does not drive, does not leave the house unless for MD  appointments, likes old country western movies.      Prior Function Level of Independence: Independent with assistive device(s)         Comments: pt typically able to perform household ambulation with axillary crutches; able to bath/dress himself     Hand Dominance   Dominant Hand: Right    Extremity/Trunk Assessment   Upper Extremity Assessment Upper Extremity Assessment: Generalized weakness;RUE deficits/detail;LUE deficits/detail RUE Deficits / Details: right arm is limited by pain, unable to lift against gravity unless assisted, R 1st MCPJ is red and swollen and grip is weak.  Unable to bring hand to mouth to self feed in current supine position  with HOB mildly elevated.  RUE: Unable to fully assess due to pain LUE Deficits / Details: PMH of polio in this arm and leg, weak at baseline.    Lower Extremity Assessment Lower Extremity Assessment: RLE deficits/detail;LLE deficits/detail RLE Deficits / Details: right leg is limited by multiple joints gout flare, ankle 2/5, knee 2/5, hip flexion (unable in supine against gravity due to pain).   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     Cervical / Trunk Assessment Cervical / Trunk Assessment: Other exceptions Cervical / Trunk Exceptions: NT due to bed level assessment.   Communication   Communication: No difficulties  Cognition Arousal/Alertness: Awake/alert Behavior During Therapy: WFL for tasks assessed/performed Overall Cognitive Status: Within Functional Limits for tasks assessed                                        General Comments General comments (skin integrity, edema, etc.): VSS throughout session.  Daughter reports he was skipping beats in the ED on tele, but nothing of note while I was in the room today.  I spoke at length with his daugter re: concerns for his medical care, unknown infection, challenges at home.  She gave me insight into things he enjoys, how he was doing PTA and support from her and her sister (they trade off).  Pt is not one to report pain or discomfort much, so for him to be grimacing in the bed he is in pain per her report.  RN made aware of pain, BM issues and daughter's concerns.      Exercises     Assessment/Plan    PT Assessment Patient needs continued PT services  PT Problem List Decreased strength;Decreased range of motion;Decreased activity  tolerance;Decreased balance;Decreased mobility;Decreased knowledge of use of DME;Pain       PT Treatment Interventions DME instruction;Gait training;Stair training;Functional mobility training;Therapeutic activities;Therapeutic exercise;Balance training;Patient/family education;Wheelchair mobility training;Modalities    PT Goals (Current goals can be found in the Care Plan section)  Acute Rehab PT Goals Patient Stated Goal: get better PT Goal Formulation: With patient/family Time For Goal Achievement: 07/08/20 Potential to Achieve Goals: Good    Frequency Min 3X/week   Barriers to discharge        Co-evaluation               AM-PAC PT "6 Clicks" Mobility  Outcome Measure Help needed turning from your back to your side while in a flat  bed without using bedrails?: Total Help needed moving from lying on your back to sitting on the side of a flat bed without using bedrails?: Total Help needed moving to and from a bed to a chair (including a wheelchair)?: Total Help needed standing up from a chair using your arms (e.g., wheelchair or bedside chair)?: Total Help needed to walk in hospital room?: Total Help needed climbing 3-5 steps with a railing? : Total 6 Click Score: 6    End of Session   Activity Tolerance: Patient limited by pain Patient left: in bed;with call bell/phone within reach;with family/visitor present Nurse Communication: Mobility status;Patient requests pain meds;Other (comment) (BM/abdominal pain.) PT Visit Diagnosis: Muscle weakness (generalized) (M62.81);Difficulty in walking, not elsewhere classified (R26.2);Pain Pain - Right/Left: Right Pain - part of body: Knee;Ankle and joints of foot;Shoulder    Time: 2992-4268 PT Time Calculation (min) (ACUTE ONLY): 35 min   Charges:   PT Evaluation $PT Eval Moderate Complexity: 1 Mod PT Treatments $Self Care/Home Management: 8-22       Verdene Lennert, PT, DPT  Acute Rehabilitation [phone]  pager [phone]) [phone] office

## 2020-06-25 ENCOUNTER — Inpatient Hospital Stay (HOSPITAL_COMMUNITY): Payer: Medicare Other

## 2020-06-25 LAB — COMPREHENSIVE METABOLIC PANEL
ALT: 70 U/L — ABNORMAL HIGH (ref 0–44)
AST: 20 U/L (ref 15–41)
Albumin: 1.6 g/dL — ABNORMAL LOW (ref 3.5–5.0)
Alkaline Phosphatase: 149 U/L — ABNORMAL HIGH (ref 38–126)
Anion gap: 12 (ref 5–15)
BUN: 35 mg/dL — ABNORMAL HIGH (ref 8–23)
CO2: 24 mmol/L (ref 22–32)
Calcium: 8.2 mg/dL — ABNORMAL LOW (ref 8.9–10.3)
Chloride: 96 mmol/L — ABNORMAL LOW (ref 98–111)
Creatinine, Ser: 1.48 mg/dL — ABNORMAL HIGH (ref 0.61–1.24)
GFR, Estimated: 49 mL/min — ABNORMAL LOW (ref >60)
Glucose, Bld: 168 mg/dL — ABNORMAL HIGH (ref 70–99)
Potassium: 3.6 mmol/L (ref 3.5–5.1)
Sodium: 132 mmol/L — ABNORMAL LOW (ref 135–145)
Total Bilirubin: 0.4 mg/dL (ref 0.3–1.2)
Total Protein: 5.7 g/dL — ABNORMAL LOW (ref 6.5–8.1)

## 2020-06-25 LAB — CBC WITH DIFFERENTIAL/PLATELET
Abs Immature Granulocytes: 0.12 10*3/uL — ABNORMAL HIGH (ref 0.00–0.07)
Basophils Absolute: 0 10*3/uL (ref 0.0–0.1)
Basophils Relative: 0 %
Eosinophils Absolute: 0 10*3/uL (ref 0.0–0.5)
Eosinophils Relative: 0 %
HCT: 26.7 % — ABNORMAL LOW (ref 39.0–52.0)
Hemoglobin: 8.6 g/dL — ABNORMAL LOW (ref 13.0–17.0)
Immature Granulocytes: 1 %
Lymphocytes Relative: 5 %
Lymphs Abs: 0.7 10*3/uL (ref 0.7–4.0)
MCH: 27.9 pg (ref 26.0–34.0)
MCHC: 32.2 g/dL (ref 30.0–36.0)
MCV: 86.7 fL (ref 80.0–100.0)
Monocytes Absolute: 0.5 10*3/uL (ref 0.1–1.0)
Monocytes Relative: 3 %
Neutro Abs: 14.8 10*3/uL — ABNORMAL HIGH (ref 1.7–7.7)
Neutrophils Relative %: 91 %
Platelets: 409 10*3/uL — ABNORMAL HIGH (ref 150–400)
RBC: 3.08 MIL/uL — ABNORMAL LOW (ref 4.22–5.81)
RDW: 14.4 % (ref 11.5–15.5)
WBC: 16.2 10*3/uL — ABNORMAL HIGH (ref 4.0–10.5)
nRBC: 0 % (ref 0.0–0.2)

## 2020-06-25 LAB — GLUCOSE, CAPILLARY
Glucose-Capillary: 162 mg/dL — ABNORMAL HIGH (ref 70–99)
Glucose-Capillary: 175 mg/dL — ABNORMAL HIGH (ref 70–99)
Glucose-Capillary: 246 mg/dL — ABNORMAL HIGH (ref 70–99)
Glucose-Capillary: 187 mg/dL — ABNORMAL HIGH (ref 70–99)

## 2020-06-25 LAB — PROCALCITONIN: Procalcitonin: 1.14 ng/mL

## 2020-06-25 LAB — MAGNESIUM: Magnesium: 1.9 mg/dL (ref 1.7–2.4)

## 2020-06-25 LAB — SEDIMENTATION RATE: Sed Rate: >140 mm/hr — ABNORMAL HIGH (ref 0–16)

## 2020-06-25 MED ORDER — IOHEXOL 9 MG/ML PO SOLN
ORAL | Status: AC
  Administered 2020-06-25: 500 mL
  Filled 2020-06-25: qty 1000

## 2020-06-25 MED ORDER — BISACODYL 10 MG RE SUPP
10.0000 mg | Freq: Once | RECTAL | Status: AC
  Administered 2020-06-25: 10 mg via RECTAL
  Filled 2020-06-25: qty 1

## 2020-06-25 MED ORDER — SODIUM CHLORIDE 0.9 % IV SOLN: INTRAVENOUS | Status: AC

## 2020-06-25 MED ORDER — IOHEXOL 9 MG/ML PO SOLN
500.0000 mL | ORAL | Status: DC
  Administered 2020-06-25: 500 mL via ORAL

## 2020-06-25 MED ORDER — DIAZEPAM 5 MG PO TABS
5.0000 mg | ORAL_TABLET | Freq: Every evening | ORAL | Status: DC | PRN
  Administered 2020-06-25 – 2020-06-27 (×3): 5 mg via ORAL
  Filled 2020-06-25 (×3): qty 1

## 2020-06-25 MED ORDER — HYDROCODONE-ACETAMINOPHEN 7.5-325 MG PO TABS
1.0000 | ORAL_TABLET | Freq: Four times a day (QID) | ORAL | Status: DC | PRN
  Administered 2020-06-25 – 2020-06-28 (×11): 1 via ORAL
  Filled 2020-06-25 (×11): qty 1

## 2020-06-25 MED ORDER — ONDANSETRON HCL 4 MG/2ML IJ SOLN
4.0000 mg | Freq: Four times a day (QID) | INTRAMUSCULAR | Status: DC | PRN
  Administered 2020-06-25: 4 mg via INTRAVENOUS
  Filled 2020-06-25 (×2): qty 2

## 2020-06-25 NOTE — Progress Notes (Signed)
PT Cancellation Note  Patient Details Name: Eduardo Moon MRN: [id number] DOB: 11/30/1945   Cancelled Treatment:    Reason Eval/Treat Not Completed: Patient declined, no reason specified Attempted PT session x 2. First attempt, patient in severe pain but willing to work later in the day. Second attempt, patient had just finished bed pan and received food tray. Patient motivated to work with therapy and sit on EOB, however states he doesn't feel ready to stand yet. Educated patient about working towards standing and will progress him as pain control improves. PT to follow up tomorrow.   Perrin Maltese, PT, DPT Acute Rehabilitation Services Pager [phone] Office [phone]   Alda Lea 06/25/2020, 2:46 PM

## 2020-06-25 NOTE — Evaluation (Addendum)
Occupational Therapy Evaluation Patient Details Name: Eduardo Moon MRN: [id number] DOB: January 18, 1946 Today's Date: 06/25/2020    History of Present Illness 74 y.o. male admitted on 06/22/20 for infection. Pt recently admitted from 11/15-11/19/21 for UTI.  This admission working dx of generalized weakness multifactoria from dehydration, poor po intake and electrolyte abnormalaties, severe acute gout flare multiple joints R side, anion gap metabloic acidosis, SIRS of unclear etiology.  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    Clinical Impression   Pt PTA: Pt living with family and mostly independent after set-upA for ADL and mobility with axillary crutches. Pt currently, Pt elevating HOB and supplied built up red foam for feeding task; otherwise, totalA until pt able to perform more than bed level tasks. Pt PLOF: was able to feed self and now is having difficulty due to decreased ROM, strength and severe pain in BUEs R >L., thus pt is requiring set-upA to totalA overall. Pt elevating HOB for feeding task; otherwise, totalA until pt able to perform more than bed level tasks. Pt motivated to return to PLOF, but pain is too severe. Hope to progress to OOB mobility next sessions. OT required for self feeding and activity tolerance. OT following acutely.      Follow Up Recommendations  SNF;Supervision/Assistance - 24 hour    Equipment Recommendations  None recommended by OT    Recommendations for Other Services       Precautions / Restrictions Precautions Precautions: Fall Precaution Comments: left sided weakness, R sided gout flare/pain Required Braces or Orthoses: Other Brace Other Brace: L KAFO- currently in the process of getting a new one from [employer] Restrictions Weight Bearing Restrictions: No      Mobility Bed  Mobility Overal bed mobility: Needs Assistance             General bed mobility comments: Pt elevating HOB for feeding task; otherwise, totalA until pt able to perform more than bed level tasks.    Transfers                 General transfer comment: Pt unable due to gout pain today    Balance                                           ADL either performed or assessed with clinical judgement   ADL Overall ADL's : Needs assistance/impaired Eating/Feeding: Minimal assistance;With adaptive utensils;Cueing for safety;Bed level Eating/Feeding Details (indicate cue type and reason): Pt given built up foam for utensils; pt able to feed self once positioned and set-upA for opening containers Grooming: Minimal assistance;Bed level   Upper Body Bathing: Moderate assistance;Bed level   Lower Body Bathing: Maximal assistance;Bed level   Upper Body Dressing : Moderate assistance;Bed level   Lower Body Dressing: Total assistance;Bed level   Toilet Transfer: Total assistance Toilet Transfer Details (indicate cue type and reason): unable to test due to severe pain in b/l ankle Toileting- Clothing Manipulation and Hygiene: Total assistance       Functional mobility during ADLs: Total assistance (unable to truly test due to pain; OT eval for self feeding) General ADL Comments: Pt PLOF: was able to feed self and now is having difficulty due to decreased  ROM and strength in BUEs R >L., thus pt is requiring set-upA to totalA overall.     Vision Baseline Vision/History: No visual deficits Patient Visual Report: No change from baseline Vision Assessment?: No apparent visual deficits     Perception     Praxis      Pertinent Vitals/Pain Pain Assessment: Faces Faces Pain Scale: Hurts worst Pain Location: severe pain in b/l ankles and R hand Pain Descriptors / Indicators: Grimacing;Guarding;Stabbing Pain Intervention(s): Monitored during session;RN gave pain meds  during session;Repositioned     Hand Dominance Right   Extremity/Trunk Assessment Upper Extremity Assessment Upper Extremity Assessment: Generalized weakness;RUE deficits/detail;LUE deficits/detail RUE Deficits / Details: RUE AAROM, WFLs to FF 90*; elbow, WFLs; wrist and digits stiffness and swelling noted 2/5 MM grade; pain  RUE: Unable to fully assess due to pain RUE Coordination: decreased fine motor;decreased gross motor LUE Deficits / Details: PMH of polio in this arm and leg, weak at baseline.   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    Cervical / Trunk Assessment Cervical / Trunk Assessment: Other exceptions Cervical / Trunk Exceptions: did not test   Communication Communication Communication: No difficulties   Cognition Arousal/Alertness: Awake/alert Behavior During Therapy: WFL for tasks assessed/performed Overall Cognitive Status: Within Functional Limits for tasks assessed                                     General Comments  VSS on RA. Pt appeared enthusiastic about being able to feed self after set-upA. NT alerted of need to help with set-up for meals.    Exercises Other Exercises Other Exercises: hand squeeze   Shoulder Instructions      Home Living Family/patient expects to be discharged to:: Private residence Living Arrangements: Children Available Help at Discharge: Family;Available 24 hours/day Type of Home: House Home Access: Stairs to enter [employer] of Steps: 3   Home Layout: One level      Bathroom Shower/Tub: Teacher, early years/pre: Standard     Home Equipment: Crutches;Wheelchair - [employer];Hospital bed   Additional Comments: does not drive, does not leave the house unless for MD appointments, likes old country western movies.        Prior Functioning/Environment Level of Independence: Independent with assistive device(s)        Comments: pt typically able to perform household ambulation with axillary crutches; able to bath/dress himself        OT Problem List: Decreased strength;Decreased range of motion;Decreased activity tolerance;Impaired balance (sitting and/or standing);Decreased coordination;Pain;Increased edema;Impaired UE functional use;Decreased safety awareness      OT Treatment/Interventions: Self-care/ADL training;Therapeutic exercise;Energy conservation;Therapeutic activities;Patient/family education;Balance training;DME and/or AE instruction    OT Goals(Current goals can be found in the care plan section) Acute Rehab OT Goals Patient Stated Goal: get better; have less pain OT Goal Formulation: With patient Time For Goal Achievement: 07/09/20 Potential to Achieve Goals: Good ADL Goals Pt Will Perform Eating: sitting;with set-up;with adaptive utensils Pt Will Perform Upper Body Bathing: with  set-up;sitting Pt Will Transfer to Toilet: with max assist;stand pivot transfer;bedside commode Pt/caregiver will Perform Home Exercise Program: Right Upper extremity;Increased strength;Increased ROM;Both right and left upper extremity;With Supervision Additional ADL Goal #1: Pt will tolerate x5 mins of ADL tasks with AE as needed in order to increase independence with ADL and mobility.  OT Frequency: Min 2X/week   Barriers to D/C:            Co-evaluation              AM-PAC OT "6 Clicks" Daily Activity     Outcome Measure Help from another person eating meals?: A Little Help from another person taking care of personal  grooming?: A Lot Help from another person toileting, which includes using toliet, bedpan, or urinal?: Total Help from another person bathing (including washing, rinsing, drying)?: A Lot Help from another person to put on and taking off regular upper body clothing?: A Lot Help from another person to put on and taking off regular lower body clothing?: Total 6 Click Score: 11   End of Session Nurse Communication: Patient requests pain meds  Activity Tolerance: Patient limited by pain Patient left: in bed;with call bell/phone within reach;with nursing/sitter in room  OT Visit Diagnosis: Muscle weakness (generalized) (M62.81);Feeding difficulties (R63.3);Pain Pain - Right/Left: Right Pain - part of body: Ankle and joints of foot;Hand;Arm                Time: 5747-3403 OT Time Calculation (min): 24 min Charges:  OT General Charges $OT Visit: 1 Visit OT Evaluation $OT Eval Low Complexity: 1 Low OT Treatments $Self Care/Home Management : 8-22 mins  Jefferey Pica, OTR/L Acute Rehabilitation Services Pager: [phone] Office: [phone]   Orbin Mayeux C 06/25/2020, 10:26 AM

## 2020-06-25 NOTE — Progress Notes (Signed)
PROGRESS NOTE  Eduardo Moon VHQ:[id number] DOB: 08-13-1945 DOA: 06/22/2020 PCP: Timoteo Gaul, FNP  HPI/Recap of past 24 hours: Eduardo Moon is a 74 y.o. 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  Associated with increasing urination, fevers at home and chills x3 days.  Recently discharged from the hospital.  He was admitted for UTI 11/15-11/19.   He was better for a few days after leaving the hospital, but admits to worsening of symptoms over the past 2 days.  He also admits to numerous gout flares mainly affecting his right ankle right knee and right hand.  Due to concern for sepsis, blood cultures and urine cultures were collected and patient was started on IV antibiotics empirically.  He reports significant pain affecting his right fingers, right knee and right ankle.  He is having a severe gout flare.  Started IV Solu-Medrol 40 mg twice daily, will switch to prednisone on 06/25/2020 and taper off for total of 10 days of steroids.  Continue PPI twice daily for GI prophylactically.   06/25/20: His gout flare is improved.  No significant pain from his joints after starting IV steroids on 11/27.  Nausea this AM.  CT abd non acute.  No evidence of active infective process.  DC Zosyn and IV vancomycin.  Assessment/Plan: Principal Problem:   Weakness Active Problems:   HTN (hypertension)   DM2 (diabetes mellitus, type 2) (HCC)   Anemia   AKI (acute kidney injury) (HCC)   Bradycardia   Hypokalemia   Leukocytosis (leucocytosis)   Limited mobility   Knee pain  Generalized weakness suspect multifactorial secondary to dehydration, poor oral intake, and electrolyte abnormalities Presented with iron deficiency, hypokalemia, hypomagnesemia, vitamin B12 deficiency Currently repleted. Albumin 1.6 on 06/23/2020 Continue oral supplement and  encourage increase in oral protein calorie intake. PT recs HHPT  Fall precautions  AKI, suspect prerenal from poor oral intake Baseline cr 1.1 with GFR>60 Cr 1.48 with GFR 49 No hydronephrosis or evidence of renal obstruction on CT scan Start NS at 75cc/hr x 1 day Monitor UO  Repeat BMP AM  Nausea/Constipation Bowel regimen senokot 2 tabs BID, Miralax 1 dose dulcolax supp Soap sud enema AM if needed  Improving Severe acute gout flare affecting multiple joints, right ankle, right knee, right hand Presented with severe pain affecting the joints mentioned above Sed rate greater than 140 uric acid level 3.4 Continue home allopurinol Received 2 days of IV Solu-Medrol 40 mg twice daily Continue p.o. Protonix 40 mg twice daily for GI prophylaxis Switch to prednisone and taper off on 06/25/2020 for total of 10 days of steroids.  Resolved post isotoninc bicarb: Anion gap metabolic acidosis Presented with serum bicarb of 17 with anion gap of 15, lactic acid 2.0 from 2.3>> AG 12 serum bicarb 24 Started isotonic bicarb on 06/24/2020 x 1 day  Resolved post repletion: Electrolyte abnormalities: Hypokalemia, Hypomagnesemia Presented with serum potassium 3.2, repleted orally 40 mEq x 2 doses, repeated 4.1> 3.6 Serum magnesium 1.4, repleted intravenously, 4 g x 1 dose, repeated 2.3  Vitamin B12 deficiency 183, replete Continue replacement daily goal>300  Improving Elevated LFTs Alkaline phosphatase, ALT down trending Denies any abdominal pain. Continue to monitor  SIRS, from acute gout flare, no evidence of active infective process. Presented with leukocytosis 16K and tachypnea with respiration rate of 29. Recently treated Enterobacter aerogenes UTI, diagnosed on 06/11/2020  from urine culture. Received 4 days of broad-spectrum IV antibiotics Zosyn and IV vancomycin, Dced on 06/25/20.  MRSA negative Elevated procalcitonin on admission, however denies any respiratory symptoms, personally  reviewed chest x-ray which shows no evidence of lobular infiltrates Procalcitonin downtrending. Blood cultures negative to date, continue to follow Urine analysis negative for pyuria, urine culture negative Afebrile  Leukocytosis, reactive in the setting of high dose steroids Afebrile and non toxic appearing  Severe protein calorie malnutrition Albumin 1.6 BMI 24 Encourage increase of protein calorie intake Oral supplement 3 times daily between meals  Chronic normocytic anemia/iron deficiency anemia Hemoglobin uptrending 9.2 from 8.9\ Continue iron supplement, started on 06/23/2020.Marland Kitchen Baseline hemoglobin is 9.7 No overt bleeding Iron studies consistent with iron deficiency  Essential hypertension Continue home oral antihypertensives Monitor vital signs  Hyperlipidemia/GERD Continue home regimen  Left-sided paresis from childhood polio No acute issues    Code Status: Full code  Family Communication: Updated the patient's daughter via phone on 06/24/2020.  Disposition Plan: Likely will DC to home on 06/25/2020 when clinically stable.   Consultants:  None.  Procedures:  None.  Antimicrobials:  Zosyn  IV vancomycin  DVT prophylaxis: Subcu heparin 3 times daily  Status is: Inpatient    Dispo:  Patient From: Home  Planned Disposition: Home with Health Care Svc  Expected discharge date: 06/26/20  Medically stable for discharge: No, ongoing management of AKI.         Objective: Vitals:   06/24/20 2345 06/25/20 0457 06/25/20 0745 06/25/20 1258  BP: 138/88 (!) 159/76 139/89 (!) 147/72  Pulse: 86 82  82  Resp: 16 15 20    Temp: 97.9 F (36.6 C) (!) 97.5 F (36.4 C) 97.8 F (36.6 C) 98.4 F (36.9 C)  TempSrc: Oral Oral Oral Oral  SpO2: 100%     Weight:  81.2 kg    Height:        Intake/Output Summary (Last 24 hours) at 06/25/2020 1557 Last data filed at 06/25/2020 1544 Gross per 24 hour  Intake 485.4 ml  Output 1 ml  Net 484.4 ml    Filed Weights   06/22/20 1102 06/25/20 0457  Weight: 69.9 kg 81.2 kg    Exam:  . General: 74 y.o. year-old male chronically ill-appearing no acute distress. Alert and oriented x3.  . Cardiovascular: Regular rate and rhythm no rubs or gallops. Marland Kitchen Respiratory: Clear to auscultation no wheezes or rales. . Abdomen: Nontender normal bowel sounds present. . Musculoskeletal: Trace edema in lower extremities bilaterally. Marland Kitchen Psychiatry: Mood is appropriate for condition and setting.   Data Reviewed: CBC: Recent Labs  Lab 06/22/20 1113 06/23/20 0851 06/24/20 0207 06/25/20 0510  WBC 16.2* 13.7* 11.8* 16.2*  NEUTROABS 13.9* 11.7* 10.9* 14.8*  HGB 9.9* 8.5* 9.2* 8.6*  HCT 31.3* 26.3* 28.3* 26.7*  MCV 89.9 87.7 87.6 86.7  PLT 366 326 413* 517*   Basic Metabolic Panel: Recent Labs  Lab 06/22/20 1113 06/22/20 1440 06/23/20 0851 06/24/20 0207 06/25/20 0510  NA 133*  --  135 134* 132*  K 2.9*  --  3.2* 4.1 3.6  CL 100  --  103 102 96*  CO2 19*  --  17* 17* 24  GLUCOSE 207*  --  115* 206* 168*  BUN 19  --  19 26* 35*  CREATININE 1.35*  --  1.16 1.31* 1.48*  CALCIUM 9.0  --  8.6* 8.7* 8.2*  MG  --  1.5* 1.4* 2.3 1.9  PHOS  --  4.2  --   --   --  GFR: Estimated Creatinine Clearance: 42.5 mL/min (A) (by C-G formula based on SCr of 1.48 mg/dL (H)). Liver Function Tests: Recent Labs  Lab 06/22/20 1113 06/23/20 0851 06/24/20 0207 06/25/20 0510  AST 34 25 36 20  ALT 85* 66* 84* 70*  ALKPHOS 180* 147* 167* 149*  BILITOT 0.9 1.0 0.6 0.4  PROT 6.6 5.7* 6.2* 5.7*  ALBUMIN 1.9* 1.6* 1.6* 1.6*   No results for input(s): LIPASE, AMYLASE in the last 168 hours. No results for input(s): AMMONIA in the last 168 hours. Coagulation Profile: No results for input(s): INR, PROTIME in the last 168 hours. Cardiac Enzymes: Recent Labs  Lab 06/23/20 0349  CKTOTAL 24*   BNP (last 3 results) No results for input(s): PROBNP in the last 8760 hours. HbA1C: Recent Labs     06/22/20 1915  HGBA1C 5.7*   CBG: Recent Labs  Lab 06/24/20 1129 06/24/20 1621 06/24/20 2115 06/25/20 0618 06/25/20 0800  GLUCAP 213* 198* 274* 162* 175*   Lipid Profile: No results for input(s): CHOL, HDL, LDLCALC, TRIG, CHOLHDL, LDLDIRECT in the last 72 hours. Thyroid Function Tests: Recent Labs    06/22/20 1915  TSH 1.993  FREET4 1.07   Anemia Panel: Recent Labs    06/22/20 1915  VITAMINB12 183  FOLATE 9.7  FERRITIN 1,379*  TIBC 127*  IRON 18*   Urine analysis:    Component Value Date/Time   COLORURINE YELLOW 06/22/2020 1130   APPEARANCEUR CLEAR 06/22/2020 1130   LABSPEC 1.017 06/22/2020 1130   PHURINE 5.0 06/22/2020 1130   GLUCOSEU NEGATIVE 06/22/2020 1130   HGBUR NEGATIVE 06/22/2020 1130   BILIRUBINUR NEGATIVE 06/22/2020 1130   KETONESUR NEGATIVE 06/22/2020 1130   PROTEINUR 100 (A) 06/22/2020 1130   UROBILINOGEN 1.0 04/16/2012 1950   NITRITE NEGATIVE 06/22/2020 1130   LEUKOCYTESUR NEGATIVE 06/22/2020 1130   Sepsis Labs: @LABRCNTIP (procalcitonin:4,lacticidven:4)  ) Recent Results (from the past 240 hour(s))  Blood culture (routine x 2)     Status: None (Preliminary result)   Collection Time: 06/22/20 11:13 AM   Specimen: BLOOD  Result Value Ref Range Status   Specimen Description BLOOD LEFT ANTECUBITAL  Final   Special Requests   Final    BOTTLES DRAWN AEROBIC AND ANAEROBIC Blood Culture adequate volume   Culture   Final    NO GROWTH 3 DAYS Performed at [facility], [place] [address] [place], [place] [address]    Report Status PENDING  Incomplete  Urine culture     Status: None   Collection Time: 06/22/20 11:30 AM   Specimen: Urine, Random  Result Value Ref Range Status   Specimen Description URINE, RANDOM  Final   Special Requests NONE  Final   Culture   Final    NO GROWTH Performed at [facility], [address] [place], [place] [address]    Report Status 06/24/2020 FINAL  Final  Blood culture (routine x 2)     Status:  None (Preliminary result)   Collection Time: 06/22/20  2:02 PM   Specimen: BLOOD  Result Value Ref Range Status   Specimen Description BLOOD RIGHT ANTECUBITAL  Final   Special Requests   Final    BOTTLES DRAWN AEROBIC AND ANAEROBIC Blood Culture adequate volume   Culture   Final    NO GROWTH 3 DAYS Performed at [facility], [place] [address], [place], [place] [address]    Report Status PENDING  Incomplete  Resp Panel by RT-PCR (Flu A&B, Covid) Nasopharyngeal Swab  Status: None   Collection Time: 06/22/20  2:19 PM   Specimen: Nasopharyngeal Swab; Nasopharyngeal(NP) swabs in vial transport medium  Result Value Ref Range Status   SARS Coronavirus 2 by RT PCR NEGATIVE NEGATIVE Final    Comment: (NOTE) SARS-CoV-2 target nucleic acids are NOT DETECTED.  The SARS-CoV-2 RNA is generally detectable in upper respiratory specimens during the acute phase of infection. The lowest concentration of SARS-CoV-2 viral copies this assay can detect is 138 copies/mL. A negative result does not preclude SARS-Cov-2 infection and should not be used as the sole basis for treatment or other patient management decisions. A negative result may occur with  improper specimen collection/handling, submission of specimen other than nasopharyngeal swab, presence of viral mutation(s) within the areas targeted by this assay, and inadequate number of viral copies(<138 copies/mL). A negative result must be combined with clinical observations, patient history, and epidemiological information. The expected result is Negative.  Fact Sheet for Patients:  EntrepreneurPulse.com.au  Fact Sheet for Healthcare Providers:  IncredibleEmployment.be  This test is no t yet approved or cleared by the [place] FDA and  has been authorized for detection and/or diagnosis of SARS-CoV-2 by FDA under an Emergency Use Authorization (EUA). This EUA will remain  in effect (meaning this  test can be used) for the duration of the COVID-19 declaration under Section 564(b)(1) of the Act, 21 U.S.C.section 360bbb-3(b)(1), unless the authorization is terminated  or revoked sooner.       Influenza A by PCR NEGATIVE NEGATIVE Final   Influenza B by PCR NEGATIVE NEGATIVE Final    Comment: (NOTE) The Xpert Xpress SARS-CoV-2/FLU/RSV plus assay is intended as an aid in the diagnosis of influenza from Nasopharyngeal swab specimens and should not be used as a sole basis for treatment. Nasal washings and aspirates are unacceptable for Xpert Xpress SARS-CoV-2/FLU/RSV testing.  Fact Sheet for Patients: EntrepreneurPulse.com.au  Fact Sheet for Healthcare Providers: IncredibleEmployment.be  This test is not yet approved or cleared by the [place] FDA and has been authorized for detection and/or diagnosis of SARS-CoV-2 by FDA under an Emergency Use Authorization (EUA). This EUA will remain in effect (meaning this test can be used) for the duration of the COVID-19 declaration under Section 564(b)(1) of the Act, 21 U.S.C. section 360bbb-3(b)(1), unless the authorization is terminated or revoked.  Performed at [facility], [place] [address], [place], [place] [address]   MRSA PCR Screening     Status: None   Collection Time: 06/24/20 11:19 AM   Specimen: Nasopharyngeal  Result Value Ref Range Status   MRSA by PCR NEGATIVE NEGATIVE Final    Comment:        The GeneXpert MRSA Assay (FDA approved for NASAL specimens only), is one component of a comprehensive MRSA colonization surveillance program. It is not intended to diagnose MRSA infection nor to guide or monitor treatment for MRSA infections. Performed at [facility], [place] [address], [place], [place] [address]       Studies: CT ABDOMEN PELVIS WO CONTRAST  Result Date: 06/25/2020 CLINICAL DATA:  Nausea, vomiting. EXAM: CT ABDOMEN AND PELVIS WITHOUT CONTRAST TECHNIQUE:  Multidetector CT imaging of the abdomen and pelvis was performed following the standard protocol without IV contrast. COMPARISON:  June 25, 2010. FINDINGS: Lower chest: Minimal bilateral pleural effusions are noted with adjacent subsegmental atelectasis. Hepatobiliary: No focal liver abnormality is seen. No gallstones, gallbladder wall thickening, or biliary dilatation. Pancreas: Unremarkable. No pancreatic ductal dilatation or surrounding inflammatory changes. Spleen: Normal  in size without focal abnormality. Adrenals/Urinary Tract: Adrenal glands appear normal. Left renal cyst is noted. No hydronephrosis or renal obstruction is noted. No renal or ureteral calculi are noted. Urinary bladder is unremarkable. Stomach/Bowel: The stomach appears normal. The appendix is unremarkable. Large amount of stool is seen in the sigmoid colon and rectum concerning for impaction. Diverticulosis of descending colon is noted without inflammation. Vascular/Lymphatic: Aortic atherosclerosis. No enlarged abdominal or pelvic lymph nodes. Reproductive: Status post prostatic brachytherapy seed placement. Other: No abdominal wall hernia or abnormality. No abdominopelvic ascites. Musculoskeletal: No acute or significant osseous findings. IMPRESSION: 1. Minimal bilateral pleural effusions are noted with adjacent subsegmental atelectasis. 2. Diverticulosis of descending colon is noted without inflammation. 3. Large amount of stool is seen in the sigmoid colon and rectum concerning for impaction. 4. Status post prostatic brachytherapy seed placement. 5. Aortic atherosclerosis. Aortic Atherosclerosis (ICD10-I70.0). Electronically Signed   By: Marijo Conception M.D.   On: 06/25/2020 13:11    Scheduled Meds: . allopurinol  100 mg Oral Daily  . bisacodyl  10 mg Rectal Once  . feeding supplement (GLUCERNA SHAKE)  237 mL Oral TID BM  . ferrous sulfate  325 mg Oral Q breakfast  . heparin  5,000 Units Subcutaneous Q8H  . insulin aspart   0-15 Units Subcutaneous TID WC  . insulin aspart  0-5 Units Subcutaneous QHS  . metoprolol succinate  50 mg Oral Daily  . pantoprazole (PROTONIX) IV  40 mg Intravenous Q12H  . polyethylene glycol  17 g Oral Daily  . predniSONE  40 mg Oral Q breakfast  . rosuvastatin  5 mg Oral QPM  . saccharomyces boulardii  250 mg Oral BID  . senna-docusate  2 tablet Oral BID  . vitamin B-12  500 mcg Oral Daily    Continuous Infusions: . piperacillin-tazobactam (ZOSYN)  IV 3.375 g (06/25/20 1451)     LOS: 3 days     Kayleen Memos, MD [facility] Pager [phone]  If 7PM-7AM, please contact night-coverage [email or web address] Password [facility] 06/25/2020, 3:57 PM

## 2020-06-25 NOTE — Progress Notes (Addendum)
Pharmacy Antibiotic Note  Eduardo Moon is a 74 y.o. male admitted on 06/22/2020 with increased weakness, urinary frequency, and fever/chills for the past 3 days. Pharmacy has been consulted for vancomycin/cefepime dosing for sepsis. WBC 16.2. Afebrile. Scr 1.48 with current CrCl of 42 mL/min which is approximately at baseline.   Cultures remain negative  Plan: Vancomycin ends today. Zosyn 3.375g IV q 8 hrs - f/u LOT? Monitor renal function, cultures/sensitivites, and clinical progression    Height: 5\' 6"  (167.6 cm) Weight: 81.2 kg (179 lb 0.2 oz) (Pt has on metal brace) IBW/kg (Calculated) : 63.8  Temp (24hrs), Avg:97.9 F (36.6 C), Min:97.5 F (36.4 C), Max:98.4 F (36.9 C)  Recent Labs  Lab 06/22/20 1113 06/22/20 1317 06/23/20 0851 06/24/20 0207 06/25/20 0510  WBC 16.2*  --  13.7* 11.8* 16.2*  CREATININE 1.35*  --  1.16 1.31* 1.48*  LATICACIDVEN 2.3* 2.0*  --   --   --     Estimated Creatinine Clearance: 42.5 mL/min (A) (by C-G formula based on SCr of 1.48 mg/dL (H)).    No Known Allergies  Antimicrobials this admission: Vancomycin 11/26 >> 11/29 Cefepime 11/26 >>11/26 Zosyn 11/26 >   Dose adjustments this admission: N/a  Microbiology results: 11/26 bcx: ngtd 11/26 Ucx: ngF  Thank you for allowing pharmacy to be a part of this patient's care.  [place] Crane, Roylene Reason, BCCP Clinical Pharmacist  06/25/2020 2:52 PM   [facility] pharmacy phone numbers are listed on [email or web address]

## 2020-06-26 ENCOUNTER — Encounter (HOSPITAL_COMMUNITY): Payer: Self-pay | Admitting: Internal Medicine

## 2020-06-26 LAB — GLUCOSE, CAPILLARY
Glucose-Capillary: 97 mg/dL (ref 70–99)
Glucose-Capillary: 128 mg/dL — ABNORMAL HIGH (ref 70–99)
Glucose-Capillary: 175 mg/dL — ABNORMAL HIGH (ref 70–99)
Glucose-Capillary: 245 mg/dL — ABNORMAL HIGH (ref 70–99)

## 2020-06-26 LAB — COMPREHENSIVE METABOLIC PANEL
ALT: 57 U/L — ABNORMAL HIGH (ref 0–44)
AST: 14 U/L — ABNORMAL LOW (ref 15–41)
Albumin: 1.7 g/dL — ABNORMAL LOW (ref 3.5–5.0)
Alkaline Phosphatase: 101 U/L (ref 38–126)
Anion gap: 10 (ref 5–15)
BUN: 32 mg/dL — ABNORMAL HIGH (ref 8–23)
CO2: 25 mmol/L (ref 22–32)
Calcium: 8.2 mg/dL — ABNORMAL LOW (ref 8.9–10.3)
Chloride: 100 mmol/L (ref 98–111)
Creatinine, Ser: 1.22 mg/dL (ref 0.61–1.24)
GFR, Estimated: >60 mL/min (ref >60)
Glucose, Bld: 101 mg/dL — ABNORMAL HIGH (ref 70–99)
Potassium: 3.2 mmol/L — ABNORMAL LOW (ref 3.5–5.1)
Sodium: 135 mmol/L (ref 135–145)
Total Bilirubin: 0.3 mg/dL (ref 0.3–1.2)
Total Protein: 5.1 g/dL — ABNORMAL LOW (ref 6.5–8.1)

## 2020-06-26 LAB — CBC WITH DIFFERENTIAL/PLATELET
Abs Immature Granulocytes: 0.15 10*3/uL — ABNORMAL HIGH (ref 0.00–0.07)
Basophils Absolute: 0 10*3/uL (ref 0.0–0.1)
Basophils Relative: 0 %
Eosinophils Absolute: 0 10*3/uL (ref 0.0–0.5)
Eosinophils Relative: 0 %
HCT: 24.3 % — ABNORMAL LOW (ref 39.0–52.0)
Hemoglobin: 7.9 g/dL — ABNORMAL LOW (ref 13.0–17.0)
Immature Granulocytes: 2 %
Lymphocytes Relative: 14 %
Lymphs Abs: 1.4 10*3/uL (ref 0.7–4.0)
MCH: 28.7 pg (ref 26.0–34.0)
MCHC: 32.5 g/dL (ref 30.0–36.0)
MCV: 88.4 fL (ref 80.0–100.0)
Monocytes Absolute: 0.8 10*3/uL (ref 0.1–1.0)
Monocytes Relative: 8 %
Neutro Abs: 7.5 10*3/uL (ref 1.7–7.7)
Neutrophils Relative %: 76 %
Platelets: 380 10*3/uL (ref 150–400)
RBC: 2.75 MIL/uL — ABNORMAL LOW (ref 4.22–5.81)
RDW: 14.4 % (ref 11.5–15.5)
WBC: 9.9 10*3/uL (ref 4.0–10.5)
nRBC: 0 % (ref 0.0–0.2)

## 2020-06-26 LAB — PROCALCITONIN: Procalcitonin: 0.74 ng/mL

## 2020-06-26 MED ORDER — POTASSIUM CHLORIDE CRYS ER 20 MEQ PO TBCR
40.0000 meq | EXTENDED_RELEASE_TABLET | Freq: Once | ORAL | Status: AC
  Administered 2020-06-26: 40 meq via ORAL
  Filled 2020-06-26: qty 2

## 2020-06-26 MED ORDER — MAGNESIUM OXIDE 400 (241.3 MG) MG PO TABS
800.0000 mg | ORAL_TABLET | Freq: Once | ORAL | Status: AC
  Administered 2020-06-26: 800 mg via ORAL
  Filled 2020-06-26: qty 2

## 2020-06-26 MED ORDER — POTASSIUM CHLORIDE CRYS ER 20 MEQ PO TBCR
40.0000 meq | EXTENDED_RELEASE_TABLET | Freq: Two times a day (BID) | ORAL | Status: AC
  Administered 2020-06-26 (×2): 40 meq via ORAL
  Filled 2020-06-26 (×2): qty 2

## 2020-06-26 MED ORDER — PANTOPRAZOLE SODIUM 40 MG PO TBEC
40.0000 mg | DELAYED_RELEASE_TABLET | Freq: Two times a day (BID) | ORAL | Status: DC
  Administered 2020-06-26 – 2020-06-28 (×4): 40 mg via ORAL
  Filled 2020-06-26 (×4): qty 1

## 2020-06-26 MED ORDER — SODIUM CHLORIDE 0.9 % IV SOLN: INTRAVENOUS | Status: DC

## 2020-06-26 NOTE — Progress Notes (Signed)
Physical Therapy Treatment Patient Details Name: Eduardo Moon MRN: [id number] DOB: 1946-01-09 Today's Date: 06/26/2020    History of Present Illness 74 y.o. male admitted on 06/22/20 for infection. Pt recently admitted from 11/15-11/19/21 for UTI.  This admission working dx of generalized weakness multifactoria from dehydration, poor po intake and electrolyte abnormalaties, severe acute gout flare multiple joints R side, anion gap metabloic acidosis, SIRS of unclear etiology.  Pt with significant PMH of polio as a child with L sided weakness and L KAFO, HTN, scrotal abcess s/p removal of testicle, bil ankle reconstruction as a child (23-58 years old), prostate CA s/p seed imp.     PT Comments    Upon arrival pt stated he has PT coming to the house and does not need therapy in the hospital, he needs to rest. Pt educated on benefits of therapy to improve strength and maintain ROM. Pt states he feels weak and needs to rest. Therapist educated pt on HEP and performing LE movement to improve strength and decrease the feeling of weakness. Pt agreeable to limited bed exercises. Pt requiring AAROM to perform through full ROM on B LEs. Pt continues to be limited by strength, endurance, balance, safety and coordination and will benefit from skilled PT to address deficits to maximize independence with functional mobility prior to discharge.     Follow Up Recommendations  Home health PT;Supervision/Assistance - 24 hour     Equipment Recommendations       Recommendations for Other Services       Precautions / Restrictions      Mobility  Bed Mobility                  Transfers                    Ambulation/Gait                 Stairs             Wheelchair Mobility    Modified Rankin (Stroke Patients Only)       Balance                                            Cognition                                               Exercises General Exercises - Lower Extremity Ankle Circles/Pumps: AAROM;Right;10 reps;Supine Gluteal Sets: AROM;Strengthening;Both;5 reps;Supine Heel Slides: AAROM;Right;10 reps;Supine Hip ABduction/ADduction: AAROM;Strengthening;Both;10 reps;Supine Straight Leg Raises: AAROM;Strengthening;Both;10 reps;Supine    General Comments General comments (skin integrity, edema, etc.): pt given increased education regarding benefit of participating in therapy as well as need for movement to improve strength      Pertinent Vitals/Pain      Home Living                      Prior Function            PT Goals (current goals can now be found in the care plan section) Acute Rehab PT Goals Patient Stated Goal: get better; have less pain PT Goal Formulation: With patient/family Time For Goal Achievement: 07/08/20 Potential to Achieve Goals: Fair Progress towards PT goals: Not progressing toward  goals - comment (pt with limited participation in therapy)    Frequency    Min 3X/week      PT Plan Current plan remains appropriate    Co-evaluation              AM-PAC PT "6 Clicks" Mobility   Outcome Measure    Help needed moving from lying on your back to sitting on the side of a flat bed without using bedrails?: Total Help needed moving to and from a bed to a chair (including a wheelchair)?: Total Help needed standing up from a chair using your arms (e.g., wheelchair or bedside chair)?: Total Help needed to walk in hospital room?: Total Help needed climbing 3-5 steps with a railing? : Total 6 Click Score: 5    End of Session   Activity Tolerance: Patient limited by fatigue Patient left: in bed;with call bell/phone within reach;with bed alarm set;with family/visitor present Nurse Communication: Mobility status PT Visit Diagnosis: Muscle weakness (generalized) (M62.81);Difficulty in walking, not elsewhere classified (R26.2)     Time: 1202-1212 PT Time  Calculation (min) (ACUTE ONLY): 10 min  Charges:  $Therapeutic Exercise: 8-22 mins                     Lyanne Co, DPT Acute Rehabilitation Services [phone]   [place] 06/26/2020, 1:19 PM

## 2020-06-26 NOTE — Plan of Care (Signed)
  Problem: Education: Goal: Knowledge of General Education information will improve Description: Including pain rating scale, medication(s)/side effects and non-pharmacologic comfort measures 06/26/2020 2313 by Robley Fries, RN Outcome: Progressing 06/26/2020 2053 by Robley Fries, RN Outcome: Progressing   Problem: Health Behavior/Discharge Planning: Goal: Ability to manage health-related needs will improve 06/26/2020 2313 by Robley Fries, RN Outcome: Progressing 06/26/2020 2053 by Robley Fries, RN Outcome: Progressing   Problem: Clinical Measurements: Goal: Ability to maintain clinical measurements within normal limits will improve 06/26/2020 2313 by Robley Fries, RN Outcome: Progressing 06/26/2020 2053 by Robley Fries, RN Outcome: Progressing Goal: Will remain free from infection 06/26/2020 2313 by Robley Fries, RN Outcome: Progressing 06/26/2020 2053 by Robley Fries, RN Outcome: Progressing Goal: Diagnostic test results will improve 06/26/2020 2313 by Robley Fries, RN Outcome: Progressing 06/26/2020 2053 by Robley Fries, RN Outcome: Progressing Goal: Respiratory complications will improve 06/26/2020 2313 by Robley Fries, RN Outcome: Progressing 06/26/2020 2053 by Robley Fries, RN Outcome: Progressing Goal: Cardiovascular complication will be avoided 06/26/2020 2313 by Robley Fries, RN Outcome: Progressing 06/26/2020 2053 by Robley Fries, RN Outcome: Progressing   Problem: Activity: Goal: Risk for activity intolerance will decrease 06/26/2020 2313 by Robley Fries, RN Outcome: Progressing 06/26/2020 2053 by Robley Fries, RN Outcome: Progressing   Problem: Nutrition: Goal: Adequate nutrition will be maintained 06/26/2020 2313 by Robley Fries, RN Outcome: Progressing 06/26/2020 2053 by Robley Fries, RN Outcome: Progressing   Problem: Coping: Goal: Level of anxiety will decrease 06/26/2020  2313 by Robley Fries, RN Outcome: Progressing 06/26/2020 2053 by Robley Fries, RN Outcome: Progressing   Problem: Elimination: Goal: Will not experience complications related to bowel motility 06/26/2020 2313 by Robley Fries, RN Outcome: Progressing 06/26/2020 2053 by Robley Fries, RN Outcome: Progressing Goal: Will not experience complications related to urinary retention 06/26/2020 2313 by Robley Fries, RN Outcome: Progressing 06/26/2020 2053 by Robley Fries, RN Outcome: Progressing   Problem: Pain Managment: Goal: General experience of comfort will improve 06/26/2020 2313 by Robley Fries, RN Outcome: Progressing 06/26/2020 2053 by Robley Fries, RN Outcome: Progressing   Problem: Safety: Goal: Ability to remain free from injury will improve 06/26/2020 2313 by Robley Fries, RN Outcome: Progressing 06/26/2020 2053 by Robley Fries, RN Outcome: Progressing   Problem: Skin Integrity: Goal: Risk for impaired skin integrity will decrease 06/26/2020 2313 by Robley Fries, RN Outcome: Progressing 06/26/2020 2053 by Robley Fries, RN Outcome: Progressing   Problem: Urinary Elimination: Goal: Signs and symptoms of infection will decrease Outcome: Progressing

## 2020-06-26 NOTE — Progress Notes (Signed)
PROGRESS NOTE  Eduardo Moon HMC:[id number] DOB: [birth year] DOA: 06/22/2020 PCP: Timoteo Gaul, FNP  HPI/Recap of past 24 hours: Eduardo Moon is a 74 y.o. 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  Associated with increasing urination, fevers at home and chills x3 days.  Recently discharged from the hospital.  He was admitted for UTI 11/15-11/19.   He was better for a few days after leaving the hospital, but admits to worsening of symptoms over the past 2 days.  He also admits to numerous gout flares mainly affecting his right ankle right knee and right hand.  Due to concern for sepsis, blood cultures and urine cultures were collected and patient was started on IV antibiotics empirically.  He reported significant pain affecting his right fingers, right knee and right ankle.  Treated for severe acute gout flare.  Received 2 days of IV Solu-Medrol 40 mg twice daily, then switched to prednisone on 06/25/2020, tapering off for total of 10 days.  Continue PPI twice daily for GI prophylactically.  CT abd/pelvis with oral contrast 06/25/20 non acute.  No evidence of active infective process, constipation.  DCed Zosyn and IV vancomycin on 06/25/20.   06/26/20: His gout flare is improved.  No significant pain from his joints after starting IV steroids on 11/27.  Drop in K+ level, replete.  PT assessed and recommended SNF.  Daughter prefers discharge to home with home health services.   Assessment/Plan: Principal Problem:   Weakness Active Problems:   HTN (hypertension)   DM2 (diabetes mellitus, type 2) (HCC)   Anemia   AKI (acute kidney injury) (HCC)   Bradycardia   Hypokalemia   Leukocytosis (leucocytosis)   Limited mobility   Knee pain  Generalized weakness suspect multifactorial secondary to dehydration, poor oral intake, and  electrolyte abnormalities Presented with iron deficiency, hypokalemia, hypomagnesemia, vitamin B12 deficiency Currently repleted. Albumin 1.6 on 06/23/2020 Continue oral supplement and encourage increase in oral protein calorie intake. PT recs HHPT  Fall precautions  Improving AKI, suspect prerenal from poor oral intake Baseline cr 1.1 with GFR>60 Cr 1.22 from 1.48 No hydronephrosis or evidence of renal obstruction on CT scan C/w NS at 75cc/hr x 1 day Monitor UO  Repeat BMP AM  Resolved Nausea/Constipation Had large BM No nausea at the time of this visit  Improving Severe acute gout flare affecting multiple joints, right ankle, right knee, right hand Presented with severe pain affecting the joints mentioned above Sed rate greater than 140 uric acid level 3.4 Continue home allopurinol Received 2 days of IV Solu-Medrol 40 mg twice daily, continue prednisone 40 mg with taper to complete 10 days of steroids. Continue p.o. Protonix 40 mg twice daily for GI prophylaxis  Resolved post isotoninc bicarb: Anion gap metabolic acidosis Presented with serum bicarb of 17 with anion gap of 15, lactic acid 2.0 from 2.3>> AG 12 serum bicarb 24 Started isotonic bicarb on 06/24/2020 x 1 day  Electrolyte abnormalities: Hypokalemia, Hypomagnesemia Presented with serum potassium 3.2, repleted orally 40 mEq x 2 doses, repeated 4.1> 3.6> 3.2 Repleted orally with kcl 40 meq x 2 doses Serum magnesium 1.4, repleted intravenously, 4 g x 1 dose, repeated 2.3>1.9  Vitamin B12 deficiency 183, replete Continue replacement daily goal>300  Improving Elevated LFTs Alkaline phosphatase, ALT down trending Denies any abdominal pain. Continue to monitor  SIRS, from acute gout flare, no  evidence of active infective process, abx dced on 11/29. Presented with leukocytosis 16K and tachypnea with respiration rate of 29. Recently treated Enterobacter aerogenes UTI, diagnosed on 06/11/2020 from urine  culture. Received 4 days of broad-spectrum IV antibiotics Zosyn and IV vancomycin, abx Dced on 06/25/20.  MRSA negative Elevated procalcitonin on admission, however denies any respiratory symptoms, personally reviewed chest x-ray which shows no evidence of lobular infiltrates Procalcitonin down-trending 0.74 from 3.46 Blood cultures negative to date, continue to follow Urine analysis negative for pyuria, urine culture negative  Leukocytosis, reactive in the setting of high dose steroids Afebrile and non toxic appearing Monitor off abx  Severe protein calorie malnutrition Albumin 1.6 BMI 24 Encourage increase of protein calorie intake Oral supplement 3 times daily between meals  Chronic normocytic anemia/iron deficiency anemia Acute drop in Hg suspect dilutional from IV fluid Repeat CBC AM Continue iron supplement, started on 06/23/2020.Marland Kitchen Baseline hemoglobin is 9.7 No overt bleeding  Essential hypertension BP stable Continue home oral antihypertensives Monitor vital signs  Hyperlipidemia/GERD Continue home regimen  Left-sided paresis from childhood polio No acute issues    Code Status: Full code  Family Communication: Updated the patient's daughter via phone on 06/26/2020.  Disposition Plan: Likely will DC to home on 06/27/2020    Consultants:  None.  Procedures:  None.  Antimicrobials:  Zosyn-completed 11/29  IV vancomycin-completed 11/29  DVT prophylaxis: Subcu heparin 3 times daily  Status is: Inpatient    Dispo:  Patient From: Home  Planned Disposition: [place]  Expected discharge date: 06/27/20  Medically stable for discharge: No, ongoing management of AKI.         Objective: Vitals:   06/25/20 2100 06/26/20 0531 06/26/20 0741 06/26/20 1138  BP: 136/68 136/68 133/66 (!) 128/55  Pulse:  72 74 73  Resp:  18 16 17   Temp:  98.5 F (36.9 C) (!) 97.3 F (36.3 C) 97.6 F (36.4 C)  TempSrc:  Oral Oral Oral  SpO2:   98% 98% 98%  Weight:  82 kg    Height:        Intake/Output Summary (Last 24 hours) at 06/26/2020 1414 Last data filed at 06/26/2020 1048 Gross per 24 hour  Intake 1903.9 ml  Output 5150 ml  Net -3246.1 ml   Filed Weights   06/22/20 1102 06/25/20 0457 06/26/20 0531  Weight: 69.9 kg 81.2 kg 82 kg    Exam:  . General: 74 y.o. year-old male Chronically ill appearing in NAd A&O x 3 . Cardiovascular: RRR no rubs or gallops . Respiratory: CTA no wheezes or rales. . Abdomen: NT ND NBS x 4 . Musculoskeletal: Trace edema in LE bilaterally . Psychiatry: Mood is appropriate.   Data Reviewed: CBC: Recent Labs  Lab 06/22/20 1113 06/23/20 0851 06/24/20 0207 06/25/20 0510 06/26/20 0719  WBC 16.2* 13.7* 11.8* 16.2* 9.9  NEUTROABS 13.9* 11.7* 10.9* 14.8* 7.5  HGB 9.9* 8.5* 9.2* 8.6* 7.9*  HCT 31.3* 26.3* 28.3* 26.7* 24.3*  MCV 89.9 87.7 87.6 86.7 88.4  PLT 366 326 413* 409* 696   Basic Metabolic Panel: Recent Labs  Lab 06/22/20 1113 06/22/20 1440 06/23/20 0851 06/24/20 0207 06/25/20 0510 06/26/20 0719  NA 133*  --  135 134* 132* 135  K 2.9*  --  3.2* 4.1 3.6 3.2*  CL 100  --  103 102 96* 100  CO2 19*  --  17* 17* 24 25  GLUCOSE 207*  --  115* 206* 168* 101*  BUN 19  --  19 26* 35* 32*  CREATININE 1.35*  --  1.16 1.31* 1.48* 1.22  CALCIUM 9.0  --  8.6* 8.7* 8.2* 8.2*  MG  --  1.5* 1.4* 2.3 1.9  --   PHOS  --  4.2  --   --   --   --    GFR: Estimated Creatinine Clearance: 51.8 mL/min (by C-G formula based on SCr of 1.22 mg/dL). Liver Function Tests: Recent Labs  Lab 06/22/20 1113 06/23/20 0851 06/24/20 0207 06/25/20 0510 06/26/20 0719  AST 34 25 36 20 14*  ALT 85* 66* 84* 70* 57*  ALKPHOS 180* 147* 167* 149* 101  BILITOT 0.9 1.0 0.6 0.4 0.3  PROT 6.6 5.7* 6.2* 5.7* 5.1*  ALBUMIN 1.9* 1.6* 1.6* 1.6* 1.7*   No results for input(s): LIPASE, AMYLASE in the last 168 hours. No results for input(s): AMMONIA in the last 168 hours. Coagulation Profile: No  results for input(s): INR, PROTIME in the last 168 hours. Cardiac Enzymes: Recent Labs  Lab 06/23/20 0349  CKTOTAL 24*   BNP (last 3 results) No results for input(s): PROBNP in the last 8760 hours. HbA1C: No results for input(s): HGBA1C in the last 72 hours. CBG: Recent Labs  Lab 06/25/20 0800 06/25/20 1622 06/25/20 2106 06/26/20 0739 06/26/20 1136  GLUCAP 175* 246* 187* 97 128*   Lipid Profile: No results for input(s): CHOL, HDL, LDLCALC, TRIG, CHOLHDL, LDLDIRECT in the last 72 hours. Thyroid Function Tests: No results for input(s): TSH, T4TOTAL, FREET4, T3FREE, THYROIDAB in the last 72 hours. Anemia Panel: No results for input(s): VITAMINB12, FOLATE, FERRITIN, TIBC, IRON, RETICCTPCT in the last 72 hours. Urine analysis:    Component Value Date/Time   COLORURINE YELLOW 06/22/2020 1130   APPEARANCEUR CLEAR 06/22/2020 1130   LABSPEC 1.017 06/22/2020 1130   PHURINE 5.0 06/22/2020 1130   GLUCOSEU NEGATIVE 06/22/2020 1130   HGBUR NEGATIVE 06/22/2020 1130   BILIRUBINUR NEGATIVE 06/22/2020 1130   KETONESUR NEGATIVE 06/22/2020 1130   PROTEINUR 100 (A) 06/22/2020 1130   UROBILINOGEN 1.0 04/16/2012 1950   NITRITE NEGATIVE 06/22/2020 1130   LEUKOCYTESUR NEGATIVE 06/22/2020 1130   Sepsis Labs: @LABRCNTIP (procalcitonin:4,lacticidven:4)  ) Recent Results (from the past 240 hour(s))  Blood culture (routine x 2)     Status: None (Preliminary result)   Collection Time: 06/22/20 11:13 AM   Specimen: BLOOD  Result Value Ref Range Status   Specimen Description BLOOD LEFT ANTECUBITAL  Final   Special Requests   Final    BOTTLES DRAWN AEROBIC AND ANAEROBIC Blood Culture adequate volume   Culture   Final    NO GROWTH 4 DAYS Performed at [facility], [place] [address] [place], [place] [address]    Report Status PENDING  Incomplete  Urine culture     Status: None   Collection Time: 06/22/20 11:30 AM   Specimen: Urine, Random  Result Value Ref Range Status   Specimen  Description URINE, RANDOM  Final   Special Requests NONE  Final   Culture   Final    NO GROWTH Performed at [facility], [address] [place], [place] [address]    Report Status 06/24/2020 FINAL  Final  Blood culture (routine x 2)     Status: None (Preliminary result)   Collection Time: 06/22/20  2:02 PM   Specimen: BLOOD  Result Value Ref Range Status   Specimen Description BLOOD RIGHT ANTECUBITAL  Final   Special Requests   Final    BOTTLES DRAWN AEROBIC AND ANAEROBIC  Blood Culture adequate volume   Culture   Final    NO GROWTH 4 DAYS Performed at [facility] [address] [place], [place] [address]    Report Status PENDING  Incomplete  Resp Panel by RT-PCR (Flu A&B, Covid) Nasopharyngeal Swab     Status: None   Collection Time: 06/22/20  2:19 PM   Specimen: Nasopharyngeal Swab; Nasopharyngeal(NP) swabs in vial transport medium  Result Value Ref Range Status   SARS Coronavirus 2 by RT PCR NEGATIVE NEGATIVE Final    Comment: (NOTE) SARS-CoV-2 target nucleic acids are NOT DETECTED.  The SARS-CoV-2 RNA is generally detectable in upper respiratory specimens during the acute phase of infection. The lowest concentration of SARS-CoV-2 viral copies this assay can detect is 138 copies/mL. A negative result does not preclude SARS-Cov-2 infection and should not be used as the sole basis for treatment or other patient management decisions. A negative result may occur with  improper specimen collection/handling, submission of specimen other than nasopharyngeal swab, presence of viral mutation(s) within the areas targeted by this assay, and inadequate number of viral copies(<138 copies/mL). A negative result must be combined with clinical observations, patient history, and epidemiological information. The expected result is Negative.  Fact Sheet for Patients:  EntrepreneurPulse.com.au  Fact Sheet for Healthcare Providers:   IncredibleEmployment.be  This test is no t yet approved or cleared by the [place] FDA and  has been authorized for detection and/or diagnosis of SARS-CoV-2 by FDA under an Emergency Use Authorization (EUA). This EUA will remain  in effect (meaning this test can be used) for the duration of the COVID-19 declaration under Section 564(b)(1) of the Act, 21 U.S.C.section 360bbb-3(b)(1), unless the authorization is terminated  or revoked sooner.       Influenza A by PCR NEGATIVE NEGATIVE Final   Influenza B by PCR NEGATIVE NEGATIVE Final    Comment: (NOTE) The Xpert Xpress SARS-CoV-2/FLU/RSV plus assay is intended as an aid in the diagnosis of influenza from Nasopharyngeal swab specimens and should not be used as a sole basis for treatment. Nasal washings and aspirates are unacceptable for Xpert Xpress SARS-CoV-2/FLU/RSV testing.  Fact Sheet for Patients: EntrepreneurPulse.com.au  Fact Sheet for Healthcare Providers: IncredibleEmployment.be  This test is not yet approved or cleared by the [place] FDA and has been authorized for detection and/or diagnosis of SARS-CoV-2 by FDA under an Emergency Use Authorization (EUA). This EUA will remain in effect (meaning this test can be used) for the duration of the COVID-19 declaration under Section 564(b)(1) of the Act, 21 U.S.C. section 360bbb-3(b)(1), unless the authorization is terminated or revoked.  Performed at [facility], [place] [address], [place], [place] [address]   MRSA PCR Screening     Status: None   Collection Time: 06/24/20 11:19 AM   Specimen: Nasopharyngeal  Result Value Ref Range Status   MRSA by PCR NEGATIVE NEGATIVE Final    Comment:        The GeneXpert MRSA Assay (FDA approved for NASAL specimens only), is one component of a comprehensive MRSA colonization surveillance program. It is not intended to diagnose MRSA infection nor to guide  or monitor treatment for MRSA infections. Performed at [facility], [place] [address], [place], [place] [address]       Studies: No results found.  Scheduled Meds: . allopurinol  100 mg Oral Daily  . feeding supplement (GLUCERNA SHAKE)  237 mL Oral TID BM  . ferrous sulfate  325 mg  Oral Q breakfast  . heparin  5,000 Units Subcutaneous Q8H  . insulin aspart  0-15 Units Subcutaneous TID WC  . insulin aspart  0-5 Units Subcutaneous QHS  . metoprolol succinate  50 mg Oral Daily  . pantoprazole  40 mg Oral BID  . polyethylene glycol  17 g Oral Daily  . potassium chloride  40 mEq Oral BID  . predniSONE  40 mg Oral Q breakfast  . rosuvastatin  5 mg Oral QPM  . saccharomyces boulardii  250 mg Oral BID  . senna-docusate  2 tablet Oral BID  . vitamin B-12  500 mcg Oral Daily    Continuous Infusions: . sodium chloride 75 mL/hr at 06/26/20 0240     LOS: 4 days     Kayleen Memos, MD [facility] Pager [phone]  If 7PM-7AM, please contact night-coverage [email or web address] Password [email or web address] 06/26/2020, 2:14 PM

## 2020-06-26 NOTE — Plan of Care (Signed)

## 2020-06-26 NOTE — Care Management Important Message (Signed)
Important Message  Patient Details  Name: Eduardo Moon MRN: [id number] Date of Birth: 03-21-1946   Medicare Important Message Given:  Yes     Shelda Altes 06/26/2020, 10:44 AM

## 2020-06-26 NOTE — TOC Initial Note (Addendum)
Transition of Care [facility]) - Initial/Assessment Note    Patient Details  Name: Nasir Bright MRN: [id number] Date of Birth: 07/16/1946  [facility] CM/SW Contact:    Zenon Mayo, RN Phone Number: 06/26/2020, 2:14 PM  Clinical Narrative:                 Patient lives with daughter, he has w/chair, bsc, scooter and hospital bed. He is active with [name] for [place], HHOT Cory confirmed.  NCM informed Tommi Rumps need to add HHAIDE. He states ok.  Patient will need PTAR transport at discharge. Patient states he thinks he will be dc tomorrow.  NCM confirmed patient's address. NCM asked if he has PCP, he states yes Nile Riggs and he would like to stay with her for right now.  NCM informed patient that his daughter would like him to have referral to  a [place] PCP, he states yes he would like that also.  Patient has a follow up apt with Scarlette Calico on 12/7 at 1 pm at [place]. Daughter is aware. Ambulance forms on unit. Will need to add Discharge summary.  Expected Discharge Plan: [place] Barriers to Discharge: Continued Medical Work up   Patient Goals and CMS Choice Patient states their goals for this hospitalization and ongoing recovery are:: get better      Expected Discharge Plan and Services Expected Discharge Plan: [employer]   Discharge Planning Services: CM Consult Post Acute Care Choice: Resumption of Svcs/PTA Provider Living arrangements for the past 2 months: Single Family Home                   DME Agency: NA       HH Arranged: PT, OT, Nurse's Aide [place] Agency: [place] Date [facility] Agency Contacted: 06/26/20 Time HH Agency Contacted: 80 Representative spoke with at [place]: Tommi Rumps  Prior Living Arrangements/Services Living arrangements for the past 2 months: [place] Lives with:: Adult Children Patient language and need for interpreter reviewed:: Yes Do you feel safe going back to the place  where you live?: Yes      Need for Family Participation in Patient Care: Yes (Comment) Care giver support system in place?: Yes (comment) Current home services: DME, Home PT, Home OT (has w/chair, bsc, scooter, hospital bed) Criminal Activity/Legal Involvement Pertinent to Current Situation/Hospitalization: No - Comment as needed  Activities of Daily Living      Permission Sought/Granted                  Emotional Assessment   Attitude/Demeanor/Rapport: Engaged Affect (typically observed): Appropriate Orientation: : Oriented to Self, Oriented to Place, Oriented to  Time, Oriented to Situation Alcohol / Substance Use: Not Applicable Psych Involvement: No (comment)  Admission diagnosis:  Edema [R60.9] Hypokalemia [E87.6] Weakness [R53.1] Generalized weakness [R53.1] Patient Active Problem List   Diagnosis Date Noted  . Anemia 06/22/2020  . AKI (acute kidney injury) ([facility]) 06/22/2020  . Bradycardia 06/22/2020  . Hypokalemia 06/22/2020  . Leukocytosis (leucocytosis) 06/22/2020  . Limited mobility 06/22/2020  . Knee pain 06/22/2020  . HTN (hypertension) 06/11/2020  . Gout attack 06/11/2020  . Acute lower UTI 06/11/2020  . Weakness 06/11/2020  . UTI (urinary tract infection) 06/11/2020  . DM2 (diabetes mellitus, type 2) ([place]) 06/11/2020  . HLD (hyperlipidemia) 06/11/2020  . Acute prerenal azotemia 06/11/2020  . Epididymo-orchitis, acute 05/07/2012  . Scrotal abscess 05/07/2012   PCP:  Nile Riggs  C, FNP Pharmacy:   [id number] [place], [place] [address] [place] [address] Phone: [phone] Fax: [phone]     Social Determinants of Health (SDOH) Interventions    Readmission Risk Interventions Readmission Risk Prevention Plan 06/26/2020 06/13/2020  Post Dischage Appt - Complete  Medication Screening - Complete  Transportation Screening Complete Complete  PCP or  Specialist Appt within 3-5 Days Complete -  HRI or Home Care Consult Complete -  Social Work Consult for Recovery Care Planning/Counseling Complete -  Palliative Care Screening Not Applicable -  Medication Review Press photographer) Complete -  Some recent data might be hidden

## 2020-06-27 LAB — CULTURE, BLOOD (ROUTINE X 2)
Culture: NO GROWTH
Culture: NO GROWTH
Special Requests: ADEQUATE
Special Requests: ADEQUATE

## 2020-06-27 LAB — RENAL FUNCTION PANEL
Albumin: 1.8 g/dL — ABNORMAL LOW (ref 3.5–5.0)
Anion gap: 10 (ref 5–15)
BUN: 33 mg/dL — ABNORMAL HIGH (ref 8–23)
CO2: 23 mmol/L (ref 22–32)
Calcium: 8.2 mg/dL — ABNORMAL LOW (ref 8.9–10.3)
Chloride: 102 mmol/L (ref 98–111)
Creatinine, Ser: 1.14 mg/dL (ref 0.61–1.24)
GFR, Estimated: >60 mL/min (ref >60)
Glucose, Bld: 121 mg/dL — ABNORMAL HIGH (ref 70–99)
Phosphorus: 2.6 mg/dL (ref 2.5–4.6)
Potassium: 4.8 mmol/L (ref 3.5–5.1)
Sodium: 135 mmol/L (ref 135–145)

## 2020-06-27 LAB — HEMOGLOBIN AND HEMATOCRIT, BLOOD
HCT: 25.4 % — ABNORMAL LOW (ref 39.0–52.0)
Hemoglobin: 8 g/dL — ABNORMAL LOW (ref 13.0–17.0)

## 2020-06-27 LAB — GLUCOSE, CAPILLARY
Glucose-Capillary: 92 mg/dL (ref 70–99)
Glucose-Capillary: 146 mg/dL — ABNORMAL HIGH (ref 70–99)
Glucose-Capillary: 178 mg/dL — ABNORMAL HIGH (ref 70–99)
Glucose-Capillary: 224 mg/dL — ABNORMAL HIGH (ref 70–99)

## 2020-06-27 LAB — MAGNESIUM: Magnesium: 1.8 mg/dL (ref 1.7–2.4)

## 2020-06-27 NOTE — Plan of Care (Signed)

## 2020-06-27 NOTE — Progress Notes (Signed)
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 He was recently discharged from the hospital.  He was admitted for UTI from 11/15-11/19.  He was better for a few days after leaving the hospital, but admits to worsening of symptoms over the past 2 days.  He also admits to numerous gout flares mainly affecting his right ankle, right knee and right hand.  Due to concern for sepsis, blood cultures and urine cultures were collected and patient was started on IV antibiotics empirically.  He reported significant pain affecting his right hand fingers, right knee and right ankle.  Treated for severe acute gout flare.  Received 2 days of IV Solu-Medrol 40 mg twice daily, then switched to prednisone on 06/25/2020, tapering off for total of 10 days.  Continue PPI twice daily for GI prophylactically.  CT abd/pelvis with oral contrast 06/25/20 non acute.  No evidence of active infective process, constipation.    IV antibiotics were discontinued on 06/25/20.      Assessment & Plan:   Principal Problem:   Weakness Active Problems:   HTN (hypertension)   DM2 (diabetes mellitus, type 2) (HCC)   Anemia   AKI (acute kidney injury) (HCC)   Bradycardia   Hypokalemia   Leukocytosis (leucocytosis)   Limited mobility   Knee pain   Generalized weakness suspect multifactorial: Could be secondary to dehydration, poor oral intake, and electrolyte abnormalities He presented with iron deficiency, hypokalemia, hypomagnesemia, vitamin B12 deficiency Currently repleted. Albumin 1.6 on 06/23/2020 Continue oral supplement and encourage  increase in oral protein calorie intake. PT recs HHPT  Fall precautions.  Acute kidney injury > Improved. Suspect prerenal from poor oral intake. Baseline cr 1.1 with GFR>60 Cr 1.22 from 1.48 No hydronephrosis or evidence of renal obstruction on CT scan C/w NS at 75cc/hr for today Monitor UO  Repeat BMP AM  Nausea/Constipation  : Resolved  Had large BM Denies nausea vomiting  Severe acute gout flare affecting multiple joints, right ankle, right knee, right hand>>> improving Patient presented with severe pain affecting the joints mentioned above. Sed rate greater than 140 uric acid level 3.4 Continue home allopurinol Received 2 days of IV Solu-Medrol 40 mg twice daily, continue prednisone 40 mg with taper to complete 10 days of steroids. Continue p.o. Protonix 40 mg twice daily for GI prophylaxis  Anion gap metabolic acidosis >>> Resolved Presented with serum bicarb of 17 with anion gap of 15, lactic acid 2.0 from 2.3>> AG 12 serum bicarb 24 Started isotonic bicarb on 06/24/2020 x 1 day. Resolved  Electrolyte abnormalities: Hypokalemia, Hypomagnesemia : Improved. Presented with serum potassium 3.2, repleted orally 40 mEq x 2 doses, repeated 4.1> 3.6> 3.2 Repleted orally with kcl 40 meq x 2 doses Serum magnesium 1.4, repleted intravenously 4 g x 1 dose, repeated 2.3>1.9  Vitamin B12 deficiency 183, replete Continue replacement daily goal>300   Elevated LFTs >>> Improving Alkaline phosphatase, ALT down trending Denies any abdominal pain. Continue to monitor  SIRS, from acute gout flare, no evidence of active infective process, abx dced on 11/29. Presented with leukocytosis 16K and tachypnea with respiration rate of 29. Recently treated for Enterobacter aerogenes UTI, diagnosed  on 06/11/2020 from urine culture. Received 4 days of broad-spectrum IV antibiotics Zosyn and IV vancomycin, abx Dced on 06/25/20.  MRSA negative Elevated procalcitonin on admission, however  denies any respiratory symptoms,  chest x-ray which shows no evidence of lobular infiltrates Procalcitonin down-trending 0.74 from 3.46 Blood cultures negative to date, continue to follow Urine analysis negative for pyuria, urine culture negative.  Leukocytosis, reactive in the setting of high dose steroids: Afebrile and non toxic appearing Monitor off abx  Severe protein calorie malnutrition Albumin 1.6 BMI 24 Encourage increase of protein calorie intake. Oral supplement 3 times daily between meals.  Chronic normocytic anemia/iron deficiency anemia Acute drop in Hg suspect dilutional from IV fluids Repeat  Hb remains at 8.0 Continue iron supplement, started on 06/23/2020.Marland Kitchen Baseline hemoglobin is 9.7 No overt bleeding  Essential hypertension Continue home oral antihypertensives. Monitor vital signs  Hyperlipidemia/GERD Continue home regimen.  Left-sided paresis from childhood polio No acute issues. Remains at baseline.   DVT prophylaxis:Heparin sq Code Status:  Full code. Family Communication: Spoke with granddaughter at bedside. Disposition Plan:   Status is: Inpatient  Remains inpatient appropriate because:Inpatient level of care appropriate due to severity of illness   Dispo:  Patient From: Home  Planned Disposition: Home with Health Care Svc  Expected discharge date: 06/28/20  Medically stable for discharge: No   Consultants:   None  Procedures: None Antimicrobials: Anti-infectives (From admission, onward)   Start     Dose/Rate Route Frequency Ordered Stop   06/23/20 0300  vancomycin (VANCOREADY) IVPB 500 mg/100 mL  Status:  Discontinued        500 mg 100 mL/hr over 60 Minutes Intravenous Every 12 hours 06/22/20 1416 06/24/20 1808   06/23/20 0230  ceFEPIme (MAXIPIME) 2 g in sodium chloride 0.9 % 100 mL IVPB  Status:  Discontinued        2 g 200 mL/hr over 30 Minutes Intravenous Every 12 hours 06/22/20 1416 06/22/20 1549   06/22/20 2200   piperacillin-tazobactam (ZOSYN) IVPB 3.375 g  Status:  Discontinued        3.375 g 12.5 mL/hr over 240 Minutes Intravenous Every 8 hours 06/22/20 1549 06/25/20 1557   06/22/20 1430  vancomycin (VANCOREADY) IVPB 1500 mg/300 mL        1,500 mg 150 mL/hr over 120 Minutes Intravenous  Once 06/22/20 1410 06/22/20 1803   06/22/20 1400  ceFEPIme (MAXIPIME) 2 g in sodium chloride 0.9 % 100 mL IVPB        2 g 200 mL/hr over 30 Minutes Intravenous  Once 06/22/20 1358 06/22/20 1536   06/22/20 1400  metroNIDAZOLE (FLAGYL) IVPB 500 mg  Status:  Discontinued        500 mg 100 mL/hr over 60 Minutes Intravenous  Once 06/22/20 1358 06/22/20 1803   06/22/20 1400  vancomycin (VANCOCIN) IVPB 1000 mg/200 mL premix  Status:  Discontinued        1,000 mg 200 mL/hr over 60 Minutes Intravenous  Once 06/22/20 1358 06/22/20 1410     Subjective: Patient was seen and examined at bedside.  Overnight events noted.   Patient reports feeling better still has some mild tenderness in the joints but feels improved.  Objective: Vitals:   06/26/20 2020 06/27/20 0000 06/27/20 0457 06/27/20 1035  BP: (!) 141/67 116/60 (!) 150/76 135/70  Pulse: 79 72 75 76  Resp:  16 18   Temp: 97.6 F (36.4 C)  97.6 F (36.4 C)   TempSrc: Oral  Oral   SpO2:  96% 97% 94%   Weight:   79.3 kg   Height:        Intake/Output Summary (Last 24 hours) at 06/27/2020 1523 Last data filed at 06/27/2020 1329 Gross per 24 hour  Intake 1255 ml  Output 7575 ml  Net -6320 ml   Filed Weights   06/25/20 0457 06/26/20 0531 06/27/20 0457  Weight: 81.2 kg 82 kg 79.3 kg    Examination:  General exam: Appears calm and comfortable, not in any acute distress. Respiratory system: Clear to auscultation. Respiratory effort normal. Cardiovascular system: S1 & S2 heard, RRR. No JVD, murmurs, rubs, gallops or clicks. No pedal edema. Gastrointestinal system: Abdomen is nondistended, soft and nontender. No organomegaly or masses felt. Normal bowel sounds  heard. Central nervous system: Alert and oriented. No focal neurological deficits. Extremities: No edema, no cyanosis, no clubbing Skin: No rashes, lesions or ulcers Psychiatry: Judgement and insight appear normal. Mood & affect appropriate.     Data Reviewed: I have personally reviewed following labs and imaging studies  CBC: Recent Labs  Lab 06/22/20 1113 06/22/20 1113 06/23/20 0851 06/24/20 0207 06/25/20 0510 06/26/20 0719 06/27/20 0856  WBC 16.2*  --  13.7* 11.8* 16.2* 9.9  --   NEUTROABS 13.9*  --  11.7* 10.9* 14.8* 7.5  --   HGB 9.9*   < > 8.5* 9.2* 8.6* 7.9* 8.0*  HCT 31.3*   < > 26.3* 28.3* 26.7* 24.3* 25.4*  MCV 89.9  --  87.7 87.6 86.7 88.4  --   PLT 366  --  326 413* 409* 380  --    < > = values in this interval not displayed.   Basic Metabolic Panel: Recent Labs  Lab 06/22/20 1113 06/22/20 1440 06/23/20 0851 06/24/20 0207 06/25/20 0510 06/26/20 0719 06/27/20 0342  NA   < >  --  135 134* 132* 135 135  K   < >  --  3.2* 4.1 3.6 3.2* 4.8  CL   < >  --  103 102 96* 100 102  CO2   < >  --  17* 17* 24 25 23   GLUCOSE   < >  --  115* 206* 168* 101* 121*  BUN   < >  --  19 26* 35* 32* 33*  CREATININE   < >  --  1.16 1.31* 1.48* 1.22 1.14  CALCIUM   < >  --  8.6* 8.7* 8.2* 8.2* 8.2*  MG  --  1.5* 1.4* 2.3 1.9  --  1.8  PHOS  --  4.2  --   --   --   --  2.6   < > = values in this interval not displayed.   GFR: Estimated Creatinine Clearance: 54.6 mL/min (by C-G formula based on SCr of 1.14 mg/dL). Liver Function Tests: Recent Labs  Lab 06/22/20 1113 06/22/20 1113 06/23/20 0851 06/24/20 0207 06/25/20 0510 06/26/20 0719 06/27/20 0342  AST 34  --  25 36 20 14*  --   ALT 85*  --  66* 84* 70* 57*  --   ALKPHOS 180*  --  147* 167* 149* 101  --   BILITOT 0.9  --  1.0 0.6 0.4 0.3  --   PROT 6.6  --  5.7* 6.2* 5.7* 5.1*  --   ALBUMIN 1.9*   < > 1.6* 1.6* 1.6* 1.7* 1.8*   < > = values in this interval not displayed.   No results for input(s): LIPASE,  AMYLASE in the last  168 hours. No results for input(s): AMMONIA in the last 168 hours. Coagulation Profile: No results for input(s): INR, PROTIME in the last 168 hours. Cardiac Enzymes: Recent Labs  Lab 06/23/20 0349  CKTOTAL 24*   BNP (last 3 results) No results for input(s): PROBNP in the last 8760 hours. HbA1C: No results for input(s): HGBA1C in the last 72 hours. CBG: Recent Labs  Lab 06/26/20 1136 06/26/20 1621 06/26/20 2130 06/27/20 0802 06/27/20 1218  GLUCAP 128* 175* 245* 92 146*   Lipid Profile: No results for input(s): CHOL, HDL, LDLCALC, TRIG, CHOLHDL, LDLDIRECT in the last 72 hours. Thyroid Function Tests: No results for input(s): TSH, T4TOTAL, FREET4, T3FREE, THYROIDAB in the last 72 hours. Anemia Panel: No results for input(s): VITAMINB12, FOLATE, FERRITIN, TIBC, IRON, RETICCTPCT in the last 72 hours. Sepsis Labs: Recent Labs  Lab 06/22/20 1113 06/22/20 1317 06/22/20 1440 06/23/20 0349 06/24/20 0207 06/25/20 0510 06/26/20 0719  PROCALCITON  --   --    < > 2.63 1.85 1.14 0.74  LATICACIDVEN 2.3* 2.0*  --   --   --   --   --    < > = values in this interval not displayed.    Recent Results (from the past 240 hour(s))  Blood culture (routine x 2)     Status: None   Collection Time: 06/22/20 11:13 AM   Specimen: BLOOD  Result Value Ref Range Status   Specimen Description BLOOD LEFT ANTECUBITAL  Final   Special Requests   Final    BOTTLES DRAWN AEROBIC AND ANAEROBIC Blood Culture adequate volume   Culture   Final    NO GROWTH 5 DAYS Performed at [facility], [address], [place], [place] [address]    Report Status 06/27/2020 FINAL  Final  Urine culture     Status: None   Collection Time: 06/22/20 11:30 AM   Specimen: Urine, Random  Result Value Ref Range Status   Specimen Description URINE, RANDOM  Final   Special Requests NONE  Final   Culture   Final    NO GROWTH Performed at [facility], [place] [address] [place], [place]  [address]    Report Status 06/24/2020 FINAL  Final  Blood culture (routine x 2)     Status: None   Collection Time: 06/22/20  2:02 PM   Specimen: BLOOD  Result Value Ref Range Status   Specimen Description BLOOD RIGHT ANTECUBITAL  Final   Special Requests   Final    BOTTLES DRAWN AEROBIC AND ANAEROBIC Blood Culture adequate volume   Culture   Final    NO GROWTH 5 DAYS Performed at [facility], [place] [address] [place], [place] [address]    Report Status 06/27/2020 FINAL  Final  Resp Panel by RT-PCR (Flu A&B, Covid) Nasopharyngeal Swab     Status: None   Collection Time: 06/22/20  2:19 PM   Specimen: Nasopharyngeal Swab; Nasopharyngeal(NP) swabs in vial transport medium  Result Value Ref Range Status   SARS Coronavirus 2 by RT PCR NEGATIVE NEGATIVE Final    Comment: (NOTE) SARS-CoV-2 target nucleic acids are NOT DETECTED.  The SARS-CoV-2 RNA is generally detectable in upper respiratory specimens during the acute phase of infection. The lowest concentration of SARS-CoV-2 viral copies this assay can detect is 138 copies/mL. A negative result does not preclude SARS-Cov-2 infection and should not be used as the sole basis for treatment or other patient management decisions. A negative result may occur with  improper specimen collection/handling, submission of specimen other than nasopharyngeal swab, presence of viral mutation(s) within the areas targeted by this assay, and inadequate number of viral copies(<138 copies/mL). A negative result must be combined with clinical observations, patient history, and epidemiological information. The expected result is Negative.  Fact Sheet for Patients:  EntrepreneurPulse.com.au  Fact Sheet for Healthcare Providers:  IncredibleEmployment.be  This test is no t yet approved or cleared by the [place] FDA and  has been authorized for detection and/or diagnosis of SARS-CoV-2 by FDA under an Emergency  Use Authorization (EUA). This EUA will remain  in effect (meaning this test can be used) for the duration of the COVID-19 declaration under Section 564(b)(1) of the Act, 21 U.S.C.section 360bbb-3(b)(1), unless the authorization is terminated  or revoked sooner.       Influenza A by PCR NEGATIVE NEGATIVE Final   Influenza B by PCR NEGATIVE NEGATIVE Final    Comment: (NOTE) The Xpert Xpress SARS-CoV-2/FLU/RSV plus assay is intended as an aid in the diagnosis of influenza from Nasopharyngeal swab specimens and should not be used as a sole basis for treatment. Nasal washings and aspirates are unacceptable for Xpert Xpress SARS-CoV-2/FLU/RSV testing.  Fact Sheet for Patients: EntrepreneurPulse.com.au  Fact Sheet for Healthcare Providers: IncredibleEmployment.be  This test is not yet approved or cleared by the [place] FDA and has been authorized for detection and/or diagnosis of SARS-CoV-2 by FDA under an Emergency Use Authorization (EUA). This EUA will remain in effect (meaning this test can be used) for the duration of the COVID-19 declaration under Section 564(b)(1) of the Act, 21 U.S.C. section 360bbb-3(b)(1), unless the authorization is terminated or revoked.  Performed at [facility], [place] [address] [place], [place] [address]   MRSA PCR Screening     Status: None   Collection Time: 06/24/20 11:19 AM   Specimen: Nasopharyngeal  Result Value Ref Range Status   MRSA by PCR NEGATIVE NEGATIVE Final    Comment:        The GeneXpert MRSA Assay (FDA approved for NASAL specimens only), is one component of a comprehensive MRSA colonization surveillance program. It is not intended to diagnose MRSA infection nor to guide or monitor treatment for MRSA infections. Performed at [facility], [place] [address] [place], [place] [address]     Radiology Studies: No results found.  Scheduled Meds: . allopurinol  100 mg Oral  Daily  . feeding supplement (GLUCERNA SHAKE)  237 mL Oral TID BM  . ferrous sulfate  325 mg Oral Q breakfast  . heparin  5,000 Units Subcutaneous Q8H  . insulin aspart  0-15 Units Subcutaneous TID WC  . insulin aspart  0-5 Units Subcutaneous QHS  . metoprolol succinate  50 mg Oral Daily  . pantoprazole  40 mg Oral BID  . polyethylene glycol  17 g Oral Daily  . predniSONE  40 mg Oral Q breakfast  . rosuvastatin  5 mg Oral QPM  . saccharomyces boulardii  250 mg Oral BID  . senna-docusate  2 tablet Oral BID  . vitamin B-12  500 mcg Oral Daily   Continuous Infusions: . sodium chloride 75 mL/hr at 06/27/20 0904     LOS: 5 days    Time spent:25 mins.    Shawna Clamp, MD [facility]   If 7PM-7AM, please contact night-coverage

## 2020-06-27 NOTE — Progress Notes (Signed)
PT Cancellation Note  Patient Details Name: Eduardo Moon MRN: [id number] DOB: [birth year]   Cancelled Treatment:    Reason Eval/Treat Not Completed: Patient declined, no reason specified Patient refused OOB mobility and bed level exercises this session as well as previous sessions. Patient states "I will get out of bed when I get home tomorrow with my other therapists and daughters." Discussed with patient about need for heavy physical assistance at this time and importance of OOB mobility. Patient continued to refuse and stated "I don't want anymore physical therapy until I get home." PT will sign off. Re-order if necessary.   Perrin Maltese, PT, DPT Acute Rehabilitation Services Pager [phone] Office [phone]    Alda Lea 06/27/2020, 10:46 AM

## 2020-06-27 NOTE — Plan of Care (Signed)
Problem: Education: Goal: Knowledge of General Education information will improve Description: Including pain rating scale, medication(s)/side effects and non-pharmacologic comfort measures 06/27/2020 0413 by Robley Fries, RN Outcome: Progressing 06/27/2020 0411 by Robley Fries, RN Outcome: Progressing 06/26/2020 2313 by Robley Fries, RN Outcome: Progressing 06/26/2020 2053 by Robley Fries, RN Outcome: Progressing   Problem: Health Behavior/Discharge Planning: Goal: Ability to manage health-related needs will improve 06/27/2020 0413 by Robley Fries, RN Outcome: Progressing 06/27/2020 0411 by Robley Fries, RN Outcome: Progressing 06/26/2020 2313 by Robley Fries, RN Outcome: Progressing 06/26/2020 2053 by Robley Fries, RN Outcome: Progressing   Problem: Clinical Measurements: Goal: Ability to maintain clinical measurements within normal limits will improve 06/27/2020 0413 by Robley Fries, RN Outcome: Progressing 06/27/2020 0411 by Robley Fries, RN Outcome: Progressing 06/26/2020 2313 by Robley Fries, RN Outcome: Progressing 06/26/2020 2053 by Robley Fries, RN Outcome: Progressing Goal: Will remain free from infection 06/27/2020 0413 by Robley Fries, RN Outcome: Progressing 06/27/2020 0411 by Robley Fries, RN Outcome: Progressing 06/26/2020 2313 by Robley Fries, RN Outcome: Progressing 06/26/2020 2053 by Robley Fries, RN Outcome: Progressing Goal: Diagnostic test results will improve 06/27/2020 0413 by Robley Fries, RN Outcome: Progressing 06/27/2020 0411 by Robley Fries, RN Outcome: Progressing 06/26/2020 2313 by Robley Fries, RN Outcome: Progressing 06/26/2020 2053 by Robley Fries, RN Outcome: Progressing Goal: Respiratory complications will improve 06/27/2020 0413 by Robley Fries, RN Outcome: Progressing 06/27/2020 0411 by Robley Fries, RN Outcome: Progressing 06/26/2020 2313 by  Robley Fries, RN Outcome: Progressing 06/26/2020 2053 by Robley Fries, RN Outcome: Progressing Goal: Cardiovascular complication will be avoided 06/27/2020 0413 by Robley Fries, RN Outcome: Progressing 06/27/2020 0411 by Robley Fries, RN Outcome: Progressing 06/26/2020 2313 by Robley Fries, RN Outcome: Progressing 06/26/2020 2053 by Robley Fries, RN Outcome: Progressing   Problem: Activity: Goal: Risk for activity intolerance will decrease 06/27/2020 0413 by Robley Fries, RN Outcome: Progressing 06/27/2020 0411 by Robley Fries, RN Outcome: Progressing 06/26/2020 2313 by Robley Fries, RN Outcome: Progressing 06/26/2020 2053 by Robley Fries, RN Outcome: Progressing   Problem: Nutrition: Goal: Adequate nutrition will be maintained 06/27/2020 0413 by Robley Fries, RN Outcome: Progressing 06/27/2020 0411 by Robley Fries, RN Outcome: Progressing 06/26/2020 2313 by Robley Fries, RN Outcome: Progressing 06/26/2020 2053 by Robley Fries, RN Outcome: Progressing   Problem: Coping: Goal: Level of anxiety will decrease 06/27/2020 0413 by Robley Fries, RN Outcome: Progressing 06/27/2020 0411 by Robley Fries, RN Outcome: Progressing 06/26/2020 2313 by Robley Fries, RN Outcome: Progressing 06/26/2020 2053 by Robley Fries, RN Outcome: Progressing   Problem: Elimination: Goal: Will not experience complications related to bowel motility 06/27/2020 0413 by Robley Fries, RN Outcome: Progressing 06/27/2020 0411 by Robley Fries, RN Outcome: Progressing 06/26/2020 2313 by Robley Fries, RN Outcome: Progressing 06/26/2020 2053 by Robley Fries, RN Outcome: Progressing Goal: Will not experience complications related to urinary retention 06/27/2020 0413 by Robley Fries, RN Outcome: Progressing 06/27/2020 0411 by Robley Fries, RN Outcome: Progressing 06/26/2020 2313 by Robley Fries, RN Outcome:  Progressing 06/26/2020 2053 by Robley Fries, RN Outcome: Progressing   Problem: Pain Managment: Goal: General experience of comfort will improve 06/27/2020 0413 by Robley Fries, RN Outcome: Progressing 06/27/2020 0411 by Robley Fries, RN Outcome: Progressing 06/26/2020 2313 by Robley Fries, RN Outcome: Progressing  06/26/2020 2053 by Robley Fries, RN Outcome: Progressing   Problem: Safety: Goal: Ability to remain free from injury will improve 06/27/2020 0413 by Robley Fries, RN Outcome: Progressing 06/27/2020 0411 by Robley Fries, RN Outcome: Progressing 06/26/2020 2313 by Robley Fries, RN Outcome: Progressing 06/26/2020 2053 by Robley Fries, RN Outcome: Progressing   Problem: Skin Integrity: Goal: Risk for impaired skin integrity will decrease 06/27/2020 0413 by Robley Fries, RN Outcome: Progressing 06/27/2020 0411 by Robley Fries, RN Outcome: Progressing 06/26/2020 2313 by Robley Fries, RN Outcome: Progressing 06/26/2020 2053 by Robley Fries, RN Outcome: Progressing   Problem: Urinary Elimination: Goal: Signs and symptoms of infection will decrease 06/27/2020 0413 by Robley Fries, RN Outcome: Progressing 06/27/2020 0411 by Robley Fries, RN Outcome: Progressing 06/26/2020 2313 by Robley Fries, RN Outcome: Progressing

## 2020-06-27 NOTE — Plan of Care (Signed)
Problem: Education: Goal: Knowledge of General Education information will improve Description: Including pain rating scale, medication(s)/side effects and non-pharmacologic comfort measures 06/27/2020 0411 by Robley Fries, RN Outcome: Progressing 06/26/2020 2313 by Robley Fries, RN Outcome: Progressing 06/26/2020 2053 by Robley Fries, RN Outcome: Progressing   Problem: Health Behavior/Discharge Planning: Goal: Ability to manage health-related needs will improve 06/27/2020 0411 by Robley Fries, RN Outcome: Progressing 06/26/2020 2313 by Robley Fries, RN Outcome: Progressing 06/26/2020 2053 by Robley Fries, RN Outcome: Progressing   Problem: Clinical Measurements: Goal: Ability to maintain clinical measurements within normal limits will improve 06/27/2020 0411 by Robley Fries, RN Outcome: Progressing 06/26/2020 2313 by Robley Fries, RN Outcome: Progressing 06/26/2020 2053 by Robley Fries, RN Outcome: Progressing Goal: Will remain free from infection 06/27/2020 0411 by Robley Fries, RN Outcome: Progressing 06/26/2020 2313 by Robley Fries, RN Outcome: Progressing 06/26/2020 2053 by Robley Fries, RN Outcome: Progressing Goal: Diagnostic test results will improve 06/27/2020 0411 by Robley Fries, RN Outcome: Progressing 06/26/2020 2313 by Robley Fries, RN Outcome: Progressing 06/26/2020 2053 by Robley Fries, RN Outcome: Progressing Goal: Respiratory complications will improve 06/27/2020 0411 by Robley Fries, RN Outcome: Progressing 06/26/2020 2313 by Robley Fries, RN Outcome: Progressing 06/26/2020 2053 by Robley Fries, RN Outcome: Progressing Goal: Cardiovascular complication will be avoided 06/27/2020 0411 by Robley Fries, RN Outcome: Progressing 06/26/2020 2313 by Robley Fries, RN Outcome: Progressing 06/26/2020 2053 by Robley Fries, RN Outcome: Progressing   Problem: Activity: Goal:  Risk for activity intolerance will decrease 06/27/2020 0411 by Robley Fries, RN Outcome: Progressing 06/26/2020 2313 by Robley Fries, RN Outcome: Progressing 06/26/2020 2053 by Robley Fries, RN Outcome: Progressing   Problem: Nutrition: Goal: Adequate nutrition will be maintained 06/27/2020 0411 by Robley Fries, RN Outcome: Progressing 06/26/2020 2313 by Robley Fries, RN Outcome: Progressing 06/26/2020 2053 by Robley Fries, RN Outcome: Progressing   Problem: Coping: Goal: Level of anxiety will decrease 06/27/2020 0411 by Robley Fries, RN Outcome: Progressing 06/26/2020 2313 by Robley Fries, RN Outcome: Progressing 06/26/2020 2053 by Robley Fries, RN Outcome: Progressing   Problem: Elimination: Goal: Will not experience complications related to bowel motility 06/27/2020 0411 by Robley Fries, RN Outcome: Progressing 06/26/2020 2313 by Robley Fries, RN Outcome: Progressing 06/26/2020 2053 by Robley Fries, RN Outcome: Progressing Goal: Will not experience complications related to urinary retention 06/27/2020 0411 by Robley Fries, RN Outcome: Progressing 06/26/2020 2313 by Robley Fries, RN Outcome: Progressing 06/26/2020 2053 by Robley Fries, RN Outcome: Progressing   Problem: Pain Managment: Goal: General experience of comfort will improve 06/27/2020 0411 by Robley Fries, RN Outcome: Progressing 06/26/2020 2313 by Robley Fries, RN Outcome: Progressing 06/26/2020 2053 by Robley Fries, RN Outcome: Progressing   Problem: Safety: Goal: Ability to remain free from injury will improve 06/27/2020 0411 by Robley Fries, RN Outcome: Progressing 06/26/2020 2313 by Robley Fries, RN Outcome: Progressing 06/26/2020 2053 by Robley Fries, RN Outcome: Progressing   Problem: Skin Integrity: Goal: Risk for impaired skin integrity will decrease 06/27/2020 0411 by Robley Fries, RN Outcome:  Progressing 06/26/2020 2313 by Robley Fries, RN Outcome: Progressing 06/26/2020 2053 by Robley Fries, RN Outcome: Progressing   Problem: Urinary Elimination: Goal: Signs and symptoms of infection will decrease 06/27/2020 0411 by Robley Fries, RN Outcome: Progressing 06/26/2020 2313 by Robley Fries,  RN Outcome: Progressing

## 2020-06-28 LAB — CBC
HCT: 25.8 % — ABNORMAL LOW (ref 39.0–52.0)
Hemoglobin: 8.6 g/dL — ABNORMAL LOW (ref 13.0–17.0)
MCH: 29.1 pg (ref 26.0–34.0)
MCHC: 33.3 g/dL (ref 30.0–36.0)
MCV: 87.2 fL (ref 80.0–100.0)
Platelets: 487 10*3/uL — ABNORMAL HIGH (ref 150–400)
RBC: 2.96 MIL/uL — ABNORMAL LOW (ref 4.22–5.81)
RDW: 14.5 % (ref 11.5–15.5)
WBC: 12.6 10*3/uL — ABNORMAL HIGH (ref 4.0–10.5)
nRBC: 0.2 % (ref 0.0–0.2)

## 2020-06-28 LAB — BASIC METABOLIC PANEL
Anion gap: 11 (ref 5–15)
BUN: 34 mg/dL — ABNORMAL HIGH (ref 8–23)
CO2: 21 mmol/L — ABNORMAL LOW (ref 22–32)
Calcium: 8.4 mg/dL — ABNORMAL LOW (ref 8.9–10.3)
Chloride: 103 mmol/L (ref 98–111)
Creatinine, Ser: 1.21 mg/dL (ref 0.61–1.24)
GFR, Estimated: >60 mL/min (ref >60)
Glucose, Bld: 117 mg/dL — ABNORMAL HIGH (ref 70–99)
Potassium: 4.2 mmol/L (ref 3.5–5.1)
Sodium: 135 mmol/L (ref 135–145)

## 2020-06-28 LAB — PHOSPHORUS: Phosphorus: 2.9 mg/dL (ref 2.5–4.6)

## 2020-06-28 LAB — GLUCOSE, CAPILLARY
Glucose-Capillary: 85 mg/dL (ref 70–99)
Glucose-Capillary: 114 mg/dL — ABNORMAL HIGH (ref 70–99)

## 2020-06-28 LAB — MAGNESIUM: Magnesium: 1.9 mg/dL (ref 1.7–2.4)

## 2020-06-28 MED ORDER — FERROUS SULFATE 325 (65 FE) MG PO TABS
325.0000 mg | ORAL_TABLET | Freq: Every day | ORAL | 3 refills | Status: DC
Start: 2020-06-29 — End: 2020-11-07

## 2020-06-28 MED ORDER — FERROUS SULFATE 325 (65 FE) MG PO TABS
325.0000 mg | ORAL_TABLET | Freq: Every day | ORAL | 3 refills | Status: DC
Start: 2020-06-29 — End: 2020-06-28

## 2020-06-28 MED ORDER — PREDNISONE 20 MG PO TABS
40.0000 mg | ORAL_TABLET | Freq: Every day | ORAL | 0 refills | Status: DC
Start: 2020-06-29 — End: 2020-10-23

## 2020-06-28 MED ORDER — CYANOCOBALAMIN 500 MCG PO TABS
500.0000 ug | ORAL_TABLET | Freq: Every day | ORAL | 1 refills | Status: DC
Start: 2020-06-29 — End: 2020-11-07

## 2020-06-28 MED ORDER — CYANOCOBALAMIN 500 MCG PO TABS
500.0000 ug | ORAL_TABLET | Freq: Every day | ORAL | 1 refills | Status: DC
Start: 2020-06-29 — End: 2020-06-28

## 2020-06-28 MED ORDER — PREDNISONE 20 MG PO TABS
40.0000 mg | ORAL_TABLET | Freq: Every day | ORAL | 0 refills | Status: DC
Start: 2020-06-29 — End: 2020-06-28

## 2020-06-28 NOTE — Progress Notes (Addendum)
Patient had 7 beats VT at 1012. RN notified @1251 . Strip saved to [employer].  Patient asymptomatic.  Dr. Dwyane Dee notified. Stated to get EKG and that he will come assess patient.

## 2020-06-28 NOTE — TOC Transition Note (Signed)
Transition of Care [facility]) - CM/SW Discharge Note   Patient Details  Name: Eduardo Moon MRN: [id number] Date of Birth: [birth year]  Transition [facility] CM/SW Contact:  Bethena Roys, RN Phone Number: 06/28/2020, 11:41 AM   Clinical Narrative:  Plan for transition home today with home health services. Case Manager sent [name] a message to office aware of transition today. Case Manager spoke with daughter regarding transition home for the patient and she wants him to travel home via [place]. Medical Necessity completed and placed on the chart for transport home. Daughter has a doctors appointment at noon. Case Manager will not call [facility] until daughter arrives at the hospital- she wants to bring clothes and dress him prior to transition home. Daughter is aware to contact Case Manager upon arrival.    Final next level of care: [place] Barriers to Discharge: No Barriers Identified   Patient Goals and CMS Choice Patient states their goals for this hospitalization and ongoing recovery are:: to return home     Discharge Plan and Services   Discharge Planning Services: CM Consult Post Acute Care Choice: Resumption of Svcs/PTA Provider            DME Agency: NA       HH Arranged: PT, OT, Nurse's Aide [place] Agency: [place] Date [place]: 06/26/20 Time [place]: 41 Representative spoke with at [place]: [place]   Readmission Risk Interventions Readmission Risk Prevention Plan 06/26/2020 06/13/2020  Post Dischage Appt - Complete  Medication Screening - Complete  Transportation Screening Complete Complete  PCP or Specialist Appt within 3-5 Days Complete -  HRI or Home Care Consult Complete -  Social Work Consult for [place] Planning/Counseling Complete -  Palliative Care Screening Not Applicable -  Medication Review Press photographer) Complete -  Some recent data might be hidden

## 2020-06-28 NOTE — Discharge Summary (Signed)
Physician Discharge Summary  Eduardo Moon KAJ:[id number] DOB: 1945/12/08 DOA: 06/22/2020  PCP: Timoteo Gaul, FNP  Admit date: 06/22/2020   Discharge date: 06/28/2020.  Admitted From: Home  Disposition: Home with home services.  Recommendations for Outpatient Follow-up:  1. Follow up with PCP in 1-2 weeks. 2.   Please obtain BMP/CBC in one week. 3.   Advised to take prednisone for 3 more days to complete 7-day treatment for acute gouty attack. 4.   Patient has been discharged home.  home physical therapy has been arranged. 5.   Advised primary care physician to refer patient for cardiology ( Needs outpatient workup)   Home Health: Home PT and OT.  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 He was recently discharged from the hospital. He was admitted for UTI from 06/11/20-06/15/20.He was better for a few days after leaving the hospital, but admits to worsening of symptoms over the past 2 days. He also admits to numerous gout flares mainly affecting his right ankle, right knee and right hand. Due to concern for sepsis, blood cultures and urine cultures were collected and patient was started on IV antibiotics empirically.    Hospital course: He was admitted for generalized weakness and possible SIRS.  Antibiotic discontinued after couple of days. He reportedsignificant pain affecting his right hand fingers, right knee and right ankle.  He was treated for severe gouty attack with IV Solu-Medrol and transitioned to prednisone taper.  Arthritic pain has significantly improved.  CT abd/pelvis with oral  contrast 06/25/20: was unremarkable. There was no evidence of active infective process.  IV antibiotics were discontinued on 06/25/20. PT recommended home with home PT.  Patient is being discharged home and home services been arranged.   Patient had 7 beat run of V. tach on telemetry, Patient remained asymptomatic.  EKG unremarkable.  This could be due to electrolyte abnormalities.  Patient is being discharged home,  advised primary care physician to refer patient to cardiology for outpatient cardiac work-up.   He was managed for below problems.   Discharge Diagnoses:  Principal Problem:   Weakness Active Problems:   HTN (hypertension)   DM2 (diabetes mellitus, type 2) (HCC)   Anemia   AKI (acute kidney injury) (HCC)   Bradycardia   Hypokalemia   Leukocytosis (leucocytosis)   Limited mobility   Knee pain  Generalized weakness suspect multifactorial: Could be secondary to dehydration, poor oral intake, and electrolyte abnormalities He presented with iron deficiency, hypokalemia, hypomagnesemia, vitamin B12 deficiency Currently repleted. Albumin 1.6 on 06/23/2020 Continue oral supplement and encourage increase in oral protein calorie intake. PT recs HHPT  Fall precautions.  Acute kidney injury > Improved. Suspect prerenal from poor oral intake. Baseline cr 1.1 with GFR>60 Cr1.22 from1.48 No hydronephrosis or evidence of renal obstruction on CT scan C/wNS at 75cc/hr for today Monitor UO  Repeat BMP AM  Nausea/Constipation  : Resolved Had large BM Denies nausea vomiting  Severe acute gout flare affecting multiple joints, right ankle, right knee, right hand>>> improving Patient presented with severe pain affecting the joints mentioned above. Sed rate greater than 140 uric acid level 3.4 Continue home allopurinol Received 2 days of IV Solu-Medrol 40 mg  twice daily, continue prednisone 40 mg with taper to complete 10 days of steroids. Continue p.o. Protonix 40 mg  twice daily for GI prophylaxis  Anion gap metabolic acidosis >>> Resolved Presented with serum bicarb of 17 with anion gap of 15, lactic acid 2.0 from 2.3>> AG 12 serum bicarb 24 Started isotonic bicarb on 06/24/2020 x 1 day. Resolved  Electrolyte abnormalities: Hypokalemia, Hypomagnesemia : Improved. Presented with serum potassium 3.2, repleted orally 40 mEq x 2 doses, repeated 4.1> 3.6> 3.2 Repleted orally with kcl 40 meq x 2 doses Serum magnesium 1.4, repleted intravenously 4 g x 1 dose, repeated 2.3>1.9  Vitamin B12 deficiency 183, replete Continue replacement daily goal>300   Elevated LFTs >>> Improving Alkaline phosphatase, ALT down trending Denies any abdominal pain. Continue to monitor  SIRS, from acute gout flare, no evidence of active infective process, abx dced on 11/29. Presented with leukocytosis 16K and tachypnea with respiration rate of 29. Recently treated for Enterobacter aerogenes UTI, diagnosed on 06/11/2020 from urine culture. Received 4 days of broad-spectrum IV antibiotics Zosyn and IV vancomycin,abxDced on 06/25/20. MRSA negative Elevated procalcitonin on admission, however denies any respiratory symptoms,  chest x-ray which shows no evidence of lobular infiltrates Procalcitonin down-trending0.74 from 3.46 Blood cultures negative to date, continue to follow Urine analysis negative for pyuria, urine culture negative.  Leukocytosis, reactive in the setting of high dose steroids: Afebrile and non toxic appearing Monitor off abx  Severe protein calorie malnutrition Albumin 1.6 BMI 24 Encourage increase of protein calorie intake. Oral supplement 3 times daily between meals.  Chronic normocytic anemia/iron deficiency anemia Acute drop in Hg suspect dilutional from IV fluids Repeat  Hb remains at 8.0 Continue iron supplement, started on 06/23/2020.Marland Kitchen Baseline hemoglobin is 9.7 No overt bleeding  Essential hypertension Continue home oral  antihypertensives. Monitor vital signs  Hyperlipidemia/GERD Continue home regimen.  Left-sided paresis from childhood polio No acute issues. Remains at baseline.   Discharge Instructions  Discharge Instructions    Call MD for:  difficulty breathing, headache or visual disturbances   Complete by: As directed    Call MD for:  persistant dizziness or light-headedness   Complete by: As directed    Call MD for:  persistant nausea and vomiting   Complete by: As directed    Diet - low sodium heart healthy   Complete by: As directed    Diet Carb Modified   Complete by: As directed    Discharge instructions   Complete by: As directed    Advised to follow-up with primary care physician in 1 week Advised to take prednisone for 3 more days to complete 7-day treatment for acute gouty attack. Patient has been discharged home home physical therapy has been arranged.   Increase activity slowly   Complete by: As directed      Allergies as of 06/28/2020   No Known Allergies     Medication List    TAKE these medications   allopurinol 100 MG tablet Commonly known as: ZYLOPRIM Take 100 mg by mouth daily.   amLODipine 5 MG tablet Commonly known as: NORVASC Take 1 tablet (5 mg total) by mouth daily.   blood glucose meter kit and supplies Kit Dispense based on patient and insurance preference. Use up to four times daily as directed. (FOR ICD-9 250.00, 250.01). What changed:   how much to take  how to take this  when to take this   diazepam 5 MG tablet Commonly known as: VALIUM Take 5 mg by mouth  3 (three) times daily as needed for anxiety.   ferrous sulfate 325 (65 FE) MG tablet Take 1 tablet (325 mg total) by mouth daily with breakfast. Start taking on: June 29, 2020   HYDROcodone-acetaminophen 7.5-325 MG tablet Commonly known as: NORCO Take 1 tablet by mouth in the morning, at noon, and at bedtime.   lisinopril 30 MG tablet Commonly known as: ZESTRIL Take 1 tablet  (30 mg total) by mouth daily.   metoprolol succinate 100 MG 24 hr tablet Commonly known as: TOPROL-XL Take 100 mg by mouth daily.   omeprazole 20 MG capsule Commonly known as: PRILOSEC Take 20 mg by mouth 2 (two) times daily.   predniSONE 20 MG tablet Commonly known as: DELTASONE Take 2 tablets (40 mg total) by mouth daily with breakfast. Start taking on: June 29, 2020   rosuvastatin 5 MG tablet Commonly known as: CRESTOR Take 5 mg by mouth every evening.   senna 8.6 MG Tabs tablet Commonly known as: SENOKOT Take 1 tablet (8.6 mg total) by mouth 2 (two) times daily. What changed: when to take this   vitamin B-12 500 MCG tablet Commonly known as: CYANOCOBALAMIN Take 1 tablet (500 mcg total) by mouth daily. Start taking on: June 29, 2020   Vitamin D (Ergocalciferol) 1.25 MG (50000 UNIT) Caps capsule Commonly known as: DRISDOL Take 50,000 Units by mouth once a week.       Follow-up Information    [facility], [facility] Follow up.   Specialty: [place] Why: HHPT/OT/aide- services to resume post discharge Contact information: [address] [place]  [address] [phone]        Janith Lima, MD Follow up on 07/03/2020.   Specialty: Internal Medicine Why: 1:00 for new patient  apt.  Contact information: [place] [address] [phone]              No Known Allergies  Consultations:  None   Procedures/Studies: CT ABDOMEN PELVIS WO CONTRAST  Result Date: 06/25/2020 CLINICAL DATA:  Nausea, vomiting. EXAM: CT ABDOMEN AND PELVIS WITHOUT CONTRAST TECHNIQUE: Multidetector CT imaging of the abdomen and pelvis was performed following the standard protocol without IV contrast. COMPARISON:  June 25, 2010. FINDINGS: Lower chest: Minimal bilateral pleural effusions are noted with adjacent subsegmental atelectasis. Hepatobiliary: No focal liver abnormality is seen. No gallstones, gallbladder wall  thickening, or biliary dilatation. Pancreas: Unremarkable. No pancreatic ductal dilatation or surrounding inflammatory changes. Spleen: Normal in size without focal abnormality. Adrenals/Urinary Tract: Adrenal glands appear normal. Left renal cyst is noted. No hydronephrosis or renal obstruction is noted. No renal or ureteral calculi are noted. Urinary bladder is unremarkable. Stomach/Bowel: The stomach appears normal. The appendix is unremarkable. Large amount of stool is seen in the sigmoid colon and rectum concerning for impaction. Diverticulosis of descending colon is noted without inflammation. Vascular/Lymphatic: Aortic atherosclerosis. No enlarged abdominal or pelvic lymph nodes. Reproductive: Status post prostatic brachytherapy seed placement. Other: No abdominal wall hernia or abnormality. No abdominopelvic ascites. Musculoskeletal: No acute or significant osseous findings. IMPRESSION: 1. Minimal bilateral pleural effusions are noted with adjacent subsegmental atelectasis. 2. Diverticulosis of descending colon is noted without inflammation. 3. Large amount of stool is seen in the sigmoid colon and rectum concerning for impaction. 4. Status post prostatic brachytherapy seed placement. 5. Aortic atherosclerosis. Aortic Atherosclerosis (ICD10-I70.0). Electronically Signed   By: Marijo Conception M.D.   On: 06/25/2020 13:11   US RENAL  Result Date: 06/14/2020 CLINICAL DATA:  74 year old male with acute renal insufficiency there is EXAM: RENAL / URINARY TRACT ULTRASOUND COMPLETE COMPARISON:  CT abdomen pelvis dated 06/25/2010. FINDINGS: Right Kidney: Renal measurements: 10.0 x 5.5 x 4.2 cm = volume: 121 mL. Echogenicity within normal limits. No mass or hydronephrosis visualized. Left Kidney: Renal measurements: 9.0 x 5.1 x 4.3 cm = volume: 104 mL. Normal echogenicity. No hydronephrosis or shadowing stone. There is a 2.5 cm pole cyst. Bladder: Appears normal for degree of bladder distention. Other: None.  IMPRESSION: Left renal upper pole cyst otherwise unremarkable renal ultrasound. Electronically Signed   By: Anner Crete M.D.   On: 06/14/2020 17:27   [place] LIMITED JOINT SPACE STRUCTURES LOW RIGHT  Result Date: 06/22/2020 CLINICAL DATA:  74 year old male with right knee swelling. EXAM: ULTRASOUND right LOWER EXTREMITY LIMITED TECHNIQUE: Ultrasound examination of the lower extremity soft tissues was performed in the area of clinical concern. COMPARISON:  None. FINDINGS: There is a 9.3 x 5.0 x 1.2 cm complex collection in the anterior distal thigh above the knee. No internal vascularity identified within this collection. This likely represents a complex suprapatellar effusion versus possibly an intramuscular collection or sequela of prior hemorrhage. Radiograph of the right knee may provide additional information. No hyperemia noted in the surrounding tissue. IMPRESSION: Complex collection in the soft tissues of the anterior distal thigh versus a complex suprapatellar effusion. Radiograph may provide additional information. Electronically Signed   By: Anner Crete M.D.   On: 06/22/2020 19:00   DG Chest Portable 1 View  Result Date: 06/22/2020 CLINICAL DATA:  Fever. EXAM: PORTABLE CHEST 1 VIEW COMPARISON:  CT chest 04/14/2011.  Chest radiographs 03/19/2011. FINDINGS: Heart size within normal limits. Aortic atherosclerosis. No appreciable airspace consolidation. No evidence of pleural effusion or pneumothorax. No acute bony abnormality identified. IMPRESSION: No evidence of acute cardiopulmonary abnormality. Aortic Atherosclerosis (ICD10-I70.0). Electronically Signed   By: Kellie Simmering DO   On: 06/22/2020 14:37   ECHOCARDIOGRAM COMPLETE  Result Date: 06/22/2020    ECHOCARDIOGRAM REPORT   Patient Name:   Eduardo Moon Date of Exam: 06/22/2020 Medical Rec #:  [id number]     Height:       66.0 in Accession #:    [id number]    Weight:       154.0 lb Date of Birth:  [birth year]     BSA:          1.790 m  Patient Age:    42 years      BP:           139/71 mmHg Patient Gender: M             HR:           81 bpm. Exam Location:  Inpatient Procedure: 2D Echo, Cardiac Doppler and Color Doppler Indications:    I10 Hypertension  History:        Patient has no prior history of Echocardiogram examinations.                 GERD. Cancer.  Sonographer:    Jonelle Sidle Dance Referring Phys: [id number] EKTA V PATEL  Sonographer Comments: No subcostal window. IMPRESSIONS  1. Left ventricular ejection fraction, by estimation, is 60 to 65%. The left ventricle has normal function. The left ventricle has no regional wall motion abnormalities. Left ventricular diastolic parameters are consistent with Grade I diastolic dysfunction (impaired relaxation). Elevated left atrial pressure.  2. Right ventricular systolic function is normal. The right ventricular size is normal.  3. The  mitral valve is normal in structure. Mild to moderate mitral valve regurgitation. No evidence of mitral stenosis.  4. The aortic valve is normal in structure. Aortic valve regurgitation is not visualized. No aortic stenosis is present.  5. The inferior vena cava is normal in size with greater than 50% respiratory variability, suggesting right atrial pressure of 3 mmHg. FINDINGS  Left Ventricle: Left ventricular ejection fraction, by estimation, is 60 to 65%. The left ventricle has normal function. The left ventricle has no regional wall motion abnormalities. The left ventricular internal cavity size was normal in size. There is  no left ventricular hypertrophy. Left ventricular diastolic parameters are consistent with Grade I diastolic dysfunction (impaired relaxation). Elevated left atrial pressure. Right Ventricle: The right ventricular size is normal. No increase in right ventricular wall thickness. Right ventricular systolic function is normal. Left Atrium: Left atrial size was normal in size. Right Atrium: Right atrial size was normal in size. Pericardium: There is  no evidence of pericardial effusion. Mitral Valve: The mitral valve is normal in structure. Mild to moderate mitral valve regurgitation, with centrally-directed jet. No evidence of mitral valve stenosis. Tricuspid Valve: The tricuspid valve is normal in structure. Tricuspid valve regurgitation is not demonstrated. No evidence of tricuspid stenosis. Aortic Valve: The aortic valve is normal in structure. Aortic valve regurgitation is not visualized. No aortic stenosis is present. Pulmonic Valve: The pulmonic valve was normal in structure. Pulmonic valve regurgitation is not visualized. No evidence of pulmonic stenosis. Aorta: The aortic root is normal in size and structure. Venous: The inferior vena cava is normal in size with greater than 50% respiratory variability, suggesting right atrial pressure of 3 mmHg. IAS/Shunts: No atrial level shunt detected by color flow Doppler.  LEFT VENTRICLE PLAX 2D LVIDd:         4.27 cm  Diastology LVIDs:         3.19 cm  LV e' medial:    4.46 cm/s LV PW:         0.98 cm  LV E/e' medial:  18.2 LV IVS:        0.99 cm  LV e' lateral:   4.90 cm/s LVOT diam:     2.00 cm  LV E/e' lateral: 16.5 LV SV:         81 LV SV Index:   45 LVOT Area:     3.14 cm  RIGHT VENTRICLE RV Basal diam:  3.32 cm RV Mid diam:    2.13 cm RV S prime:     11.20 cm/s TAPSE (M-mode): 2.1 cm LEFT ATRIUM             Index       RIGHT ATRIUM           Index LA diam:        3.60 cm 2.01 cm/m  RA Area:     16.40 cm LA Vol (A2C):   58.9 ml 32.91 ml/m RA Volume:   44.90 ml  25.09 ml/m LA Vol (A4C):   65.7 ml 36.71 ml/m LA Biplane Vol: 62.8 ml 35.09 ml/m  AORTIC VALVE LVOT Vmax:   131.00 cm/s LVOT Vmean:  85.350 cm/s LVOT VTI:    0.258 m  AORTA Ao Root diam: 3.20 cm Ao Asc diam:  2.80 cm MITRAL VALVE MV Area (PHT): 3.37 cm    SHUNTS MV Decel Time: 225 msec    Systemic VTI:  0.26 m MV E velocity: 81.00 cm/s  Systemic Diam: 2.00 cm MV A  velocity: 92.60 cm/s MV E/A ratio:  0.87 Mihai Croitoru MD Electronically signed  by Sanda Klein MD Signature Date/Time: 06/22/2020/4:29:33 PM    Final    VAS [place] LOWER EXTREMITY VENOUS (DVT)  Result Date: 06/25/2020  Lower Venous DVT Study Indications: Swelling. Other Indications: Gout flare. Risk Factors: History of Polio. Limitations: Orthopaedic appliance. Comparison Study: No prior study Performing Technologist: Sharion Dove RVS  Examination Guidelines: A complete evaluation includes B-mode imaging, spectral Doppler, color Doppler, and power Doppler as needed of all accessible portions of each vessel. Bilateral testing is considered an integral part of a complete examination. Limited examinations for reoccurring indications may be performed as noted. The reflux portion of the exam is performed with the patient in reverse Trendelenburg.  +---------+---------------+---------+-----------+----------+--------------+ RIGHT    CompressibilityPhasicitySpontaneityPropertiesThrombus Aging +---------+---------------+---------+-----------+----------+--------------+ CFV      Full           Yes      Yes                                 +---------+---------------+---------+-----------+----------+--------------+ SFJ      Full                                                        +---------+---------------+---------+-----------+----------+--------------+ FV Prox  Full                                                        +---------+---------------+---------+-----------+----------+--------------+ FV Mid   Full                                                        +---------+---------------+---------+-----------+----------+--------------+ FV DistalFull                                                        +---------+---------------+---------+-----------+----------+--------------+ PFV      Full                                                        +---------+---------------+---------+-----------+----------+--------------+ POP      Full            Yes      Yes                                 +---------+---------------+---------+-----------+----------+--------------+ PTV      Full                                                        +---------+---------------+---------+-----------+----------+--------------+  PERO     Full                                                        +---------+---------------+---------+-----------+----------+--------------+   +---------+---------------+---------+-----------+----------+-------------------+ LEFT     CompressibilityPhasicitySpontaneityPropertiesThrombus Aging      +---------+---------------+---------+-----------+----------+-------------------+ CFV                                                   Not well visualized +---------+---------------+---------+-----------+----------+-------------------+ SFJ                                                   Not well visualized +---------+---------------+---------+-----------+----------+-------------------+ FV Prox  Full                                                             +---------+---------------+---------+-----------+----------+-------------------+ FV Mid                                                patent with color                                                         and Doppler         +---------+---------------+---------+-----------+----------+-------------------+ FV Distal                                             patent with color                                                         and Doppler         +---------+---------------+---------+-----------+----------+-------------------+ PFV      Full                                                             +---------+---------------+---------+-----------+----------+-------------------+ POP  Not well visualized  +---------+---------------+---------+-----------+----------+-------------------+ PERO                                                  patent by color     +---------+---------------+---------+-----------+----------+-------------------+ Soleal                                                patent by color     +---------+---------------+---------+-----------+----------+-------------------+     Summary: RIGHT: - There is no evidence of deep vein thrombosis in the lower extremity.  LEFT: - There is no evidence of deep vein thrombosis in the lower extremity. However, portions of this examination were limited- see technologist comments above.  *See table(s) above for measurements and observations. Electronically signed by Ruta Hinds MD on 06/25/2020 at 3:59:25 PM.    Final       Subjective: Patient was seen and examined at bedside.  Overnight events noted.  Patient reports feeling much better and wants to be discharged home.  Home health services has been arranged.  Discharge Exam: Vitals:   06/28/20 0539 06/28/20 1237  BP: (!) 141/74 (!) 153/74  Pulse: 72 77  Resp: 17 17  Temp: 97.8 F (36.6 C) 98.1 F (36.7 C)  SpO2:  99%   Vitals:   06/27/20 1721 06/27/20 2036 06/28/20 0539 06/28/20 1237  BP: (!) 150/60 (!) 146/68 (!) 141/74 (!) 153/74  Pulse: 72 74 72 77  Resp: 18 18 17 17   Temp: 98 F (36.7 C) 98.3 F (36.8 C) 97.8 F (36.6 C) 98.1 F (36.7 C)  TempSrc: Oral Oral Oral Oral  SpO2:  96%  99%  Weight:   77.6 kg   Height:        General: Pt is alert, awake, not in acute distress Cardiovascular: RRR, S1/S2 +, no rubs, no gallops Respiratory: CTA bilaterally, no wheezing, no rhonchi Abdominal: Soft, NT, ND, bowel sounds + Extremities: Left-sided weakness from polio.    The results of significant diagnostics from this hospitalization (including imaging, microbiology, ancillary and laboratory) are listed below for reference.     Microbiology: Recent Results  (from the past 240 hour(s))  Blood culture (routine x 2)     Status: None   Collection Time: 06/22/20 11:13 AM   Specimen: BLOOD  Result Value Ref Range Status   Specimen Description BLOOD LEFT ANTECUBITAL  Final   Special Requests   Final    BOTTLES DRAWN AEROBIC AND ANAEROBIC Blood Culture adequate volume   Culture   Final    NO GROWTH 5 DAYS Performed at [facility], [address], [place], [place] [address]    Report Status 06/27/2020 FINAL  Final  Urine culture     Status: None   Collection Time: 06/22/20 11:30 AM   Specimen: Urine, Random  Result Value Ref Range Status   Specimen Description URINE, RANDOM  Final   Special Requests NONE  Final   Culture   Final    NO GROWTH Performed at [facility], [place], [place], [place] [address]    Report Status 06/24/2020 FINAL  Final  Blood culture (routine x 2)     Status: None   Collection Time: 06/22/20  2:02  PM   Specimen: BLOOD  Result Value Ref Range Status   Specimen Description BLOOD RIGHT ANTECUBITAL  Final   Special Requests   Final    BOTTLES DRAWN AEROBIC AND ANAEROBIC Blood Culture adequate volume   Culture   Final    NO GROWTH 5 DAYS Performed at [facility], [address] [place], [place] [address]    Report Status 06/27/2020 FINAL  Final  Resp Panel by RT-PCR (Flu A&B, Covid) Nasopharyngeal Swab     Status: None   Collection Time: 06/22/20  2:19 PM   Specimen: Nasopharyngeal Swab; Nasopharyngeal(NP) swabs in vial transport medium  Result Value Ref Range Status   SARS Coronavirus 2 by RT PCR NEGATIVE NEGATIVE Final    Comment: (NOTE) SARS-CoV-2 target nucleic acids are NOT DETECTED.  The SARS-CoV-2 RNA is generally detectable in upper respiratory specimens during the acute phase of infection. The lowest concentration of SARS-CoV-2 viral copies this assay can detect is 138 copies/mL. A negative result does not preclude SARS-Cov-2 infection and should not be used as the sole  basis for treatment or other patient management decisions. A negative result may occur with  improper specimen collection/handling, submission of specimen other than nasopharyngeal swab, presence of viral mutation(s) within the areas targeted by this assay, and inadequate number of viral copies(<138 copies/mL). A negative result must be combined with clinical observations, patient history, and epidemiological information. The expected result is Negative.  Fact Sheet for Patients:  EntrepreneurPulse.com.au  Fact Sheet for Healthcare Providers:  IncredibleEmployment.be  This test is no t yet approved or cleared by the [place] FDA and  has been authorized for detection and/or diagnosis of SARS-CoV-2 by FDA under an Emergency Use Authorization (EUA). This EUA will remain  in effect (meaning this test can be used) for the duration of the COVID-19 declaration under Section 564(b)(1) of the Act, 21 U.S.C.section 360bbb-3(b)(1), unless the authorization is terminated  or revoked sooner.       Influenza A by PCR NEGATIVE NEGATIVE Final   Influenza B by PCR NEGATIVE NEGATIVE Final    Comment: (NOTE) The Xpert Xpress SARS-CoV-2/FLU/RSV plus assay is intended as an aid in the diagnosis of influenza from Nasopharyngeal swab specimens and should not be used as a sole basis for treatment. Nasal washings and aspirates are unacceptable for Xpert Xpress SARS-CoV-2/FLU/RSV testing.  Fact Sheet for Patients: EntrepreneurPulse.com.au  Fact Sheet for Healthcare Providers: IncredibleEmployment.be  This test is not yet approved or cleared by the [place] FDA and has been authorized for detection and/or diagnosis of SARS-CoV-2 by FDA under an Emergency Use Authorization (EUA). This EUA will remain in effect (meaning this test can be used) for the duration of the COVID-19 declaration under Section 564(b)(1) of the Act,  21 U.S.C. section 360bbb-3(b)(1), unless the authorization is terminated or revoked.  Performed at [facility], [place] [address], [place], [place] [address]   MRSA PCR Screening     Status: None   Collection Time: 06/24/20 11:19 AM   Specimen: Nasopharyngeal  Result Value Ref Range Status   MRSA by PCR NEGATIVE NEGATIVE Final    Comment:        The GeneXpert MRSA Assay (FDA approved for NASAL specimens only), is one component of a comprehensive MRSA colonization surveillance program. It is not intended to diagnose MRSA infection nor to guide or monitor treatment for MRSA infections. Performed at [facility], [place] [address], [place], [place] [address]  Labs: BNP (last 3 results) No results for input(s): BNP in the last 8760 hours. Basic Metabolic Panel: Recent Labs  Lab 06/22/20 1113 06/22/20 1440 06/23/20 0851 06/23/20 0851 06/24/20 0207 06/25/20 0510 06/26/20 0719 06/27/20 0342 06/28/20 0138  NA   < >  --  135   < > 134* 132* 135 135 135  K   < >  --  3.2*   < > 4.1 3.6 3.2* 4.8 4.2  CL   < >  --  103   < > 102 96* 100 102 103  CO2   < >  --  17*   < > 17* 24 25 23  21*  GLUCOSE   < >  --  115*   < > 206* 168* 101* 121* 117*  BUN   < >  --  19   < > 26* 35* 32* 33* 34*  CREATININE   < >  --  1.16   < > 1.31* 1.48* 1.22 1.14 1.21  CALCIUM   < >  --  8.6*   < > 8.7* 8.2* 8.2* 8.2* 8.4*  MG   < > 1.5* 1.4*  --  2.3 1.9  --  1.8 1.9  PHOS  --  4.2  --   --   --   --   --  2.6 2.9   < > = values in this interval not displayed.   Liver Function Tests: Recent Labs  Lab 06/22/20 1113 06/22/20 1113 06/23/20 0851 06/24/20 0207 06/25/20 0510 06/26/20 0719 06/27/20 0342  AST 34  --  25 36 20 14*  --   ALT 85*  --  66* 84* 70* 57*  --   ALKPHOS 180*  --  147* 167* 149* 101  --   BILITOT 0.9  --  1.0 0.6 0.4 0.3  --   PROT 6.6  --  5.7* 6.2* 5.7* 5.1*  --   ALBUMIN 1.9*   < > 1.6* 1.6* 1.6* 1.7* 1.8*   < > = values in this interval not displayed.    No results for input(s): LIPASE, AMYLASE in the last 168 hours. No results for input(s): AMMONIA in the last 168 hours. CBC: Recent Labs  Lab 06/22/20 1113 06/22/20 1113 06/23/20 0851 06/23/20 0851 06/24/20 0207 06/25/20 0510 06/26/20 0719 06/27/20 0856 06/28/20 0138  WBC 16.2*   < > 13.7*  --  11.8* 16.2* 9.9  --  12.6*  NEUTROABS 13.9*  --  11.7*  --  10.9* 14.8* 7.5  --   --   HGB 9.9*   < > 8.5*   < > 9.2* 8.6* 7.9* 8.0* 8.6*  HCT 31.3*   < > 26.3*   < > 28.3* 26.7* 24.3* 25.4* 25.8*  MCV 89.9   < > 87.7  --  87.6 86.7 88.4  --  87.2  PLT 366   < > 326  --  413* 409* 380  --  487*   < > = values in this interval not displayed.   Cardiac Enzymes: Recent Labs  Lab 06/23/20 0349  CKTOTAL 24*   BNP: Invalid input(s): POCBNP CBG: Recent Labs  Lab 06/27/20 1218 06/27/20 1614 06/27/20 2107 06/28/20 0803 06/28/20 1147  GLUCAP 146* 178* 224* 85 114*   D-Dimer No results for input(s): DDIMER in the last 72 hours. Hgb A1c No results for input(s): HGBA1C in the last 72 hours. Lipid Profile No results for input(s): CHOL, HDL, LDLCALC, TRIG, CHOLHDL, LDLDIRECT in the  last 72 hours. Thyroid function studies No results for input(s): TSH, T4TOTAL, T3FREE, THYROIDAB in the last 72 hours.  Invalid input(s): FREET3 Anemia work up No results for input(s): VITAMINB12, FOLATE, FERRITIN, TIBC, IRON, RETICCTPCT in the last 72 hours. Urinalysis    Component Value Date/Time   COLORURINE YELLOW 06/22/2020 1130   APPEARANCEUR CLEAR 06/22/2020 1130   LABSPEC 1.017 06/22/2020 1130   PHURINE 5.0 06/22/2020 1130   GLUCOSEU NEGATIVE 06/22/2020 1130   HGBUR NEGATIVE 06/22/2020 1130   BILIRUBINUR NEGATIVE 06/22/2020 1130   KETONESUR NEGATIVE 06/22/2020 1130   PROTEINUR 100 (A) 06/22/2020 1130   UROBILINOGEN 1.0 04/16/2012 1950   NITRITE NEGATIVE 06/22/2020 1130   LEUKOCYTESUR NEGATIVE 06/22/2020 1130   Sepsis Labs Invalid input(s): PROCALCITONIN,  WBC,   LACTICIDVEN Microbiology Recent Results (from the past 240 hour(s))  Blood culture (routine x 2)     Status: None   Collection Time: 06/22/20 11:13 AM   Specimen: BLOOD  Result Value Ref Range Status   Specimen Description BLOOD LEFT ANTECUBITAL  Final   Special Requests   Final    BOTTLES DRAWN AEROBIC AND ANAEROBIC Blood Culture adequate volume   Culture   Final    NO GROWTH 5 DAYS Performed at [facility], [address], [place], [place] [address]    Report Status 06/27/2020 FINAL  Final  Urine culture     Status: None   Collection Time: 06/22/20 11:30 AM   Specimen: Urine, Random  Result Value Ref Range Status   Specimen Description URINE, RANDOM  Final   Special Requests NONE  Final   Culture   Final    NO GROWTH Performed at [facility], [place] [address] [place], [place] [address]    Report Status 06/24/2020 FINAL  Final  Blood culture (routine x 2)     Status: None   Collection Time: 06/22/20  2:02 PM   Specimen: BLOOD  Result Value Ref Range Status   Specimen Description BLOOD RIGHT ANTECUBITAL  Final   Special Requests   Final    BOTTLES DRAWN AEROBIC AND ANAEROBIC Blood Culture adequate volume   Culture   Final    NO GROWTH 5 DAYS Performed at [facility], [place] [address], [place], [place] [address]    Report Status 06/27/2020 FINAL  Final  Resp Panel by RT-PCR (Flu A&B, Covid) Nasopharyngeal Swab     Status: None   Collection Time: 06/22/20  2:19 PM   Specimen: Nasopharyngeal Swab; Nasopharyngeal(NP) swabs in vial transport medium  Result Value Ref Range Status   SARS Coronavirus 2 by RT PCR NEGATIVE NEGATIVE Final    Comment: (NOTE) SARS-CoV-2 target nucleic acids are NOT DETECTED.  The SARS-CoV-2 RNA is generally detectable in upper respiratory specimens during the acute phase of infection. The lowest concentration of SARS-CoV-2 viral copies this assay can detect is 138 copies/mL. A negative result does not preclude  SARS-Cov-2 infection and should not be used as the sole basis for treatment or other patient management decisions. A negative result may occur with  improper specimen collection/handling, submission of specimen other than nasopharyngeal swab, presence of viral mutation(s) within the areas targeted by this assay, and inadequate number of viral copies(<138 copies/mL). A negative result must be combined with clinical observations, patient history, and epidemiological information. The expected result is Negative.  Fact Sheet for Patients:  EntrepreneurPulse.com.au  Fact Sheet for Healthcare Providers:  IncredibleEmployment.be  This test is no t yet approved or cleared  by the [place] and  has been authorized for detection and/or diagnosis of SARS-CoV-2 by FDA under an Emergency Use Authorization (EUA). This EUA will remain  in effect (meaning this test can be used) for the duration of the COVID-19 declaration under Section 564(b)(1) of the Act, 21 U.S.C.section 360bbb-3(b)(1), unless the authorization is terminated  or revoked sooner.       Influenza A by PCR NEGATIVE NEGATIVE Final   Influenza B by PCR NEGATIVE NEGATIVE Final    Comment: (NOTE) The Xpert Xpress SARS-CoV-2/FLU/RSV plus assay is intended as an aid in the diagnosis of influenza from Nasopharyngeal swab specimens and should not be used as a sole basis for treatment. Nasal washings and aspirates are unacceptable for Xpert Xpress SARS-CoV-2/FLU/RSV testing.  Fact Sheet for Patients: EntrepreneurPulse.com.au  Fact Sheet for Healthcare Providers: IncredibleEmployment.be  This test is not yet approved or cleared by the [place] FDA and has been authorized for detection and/or diagnosis of SARS-CoV-2 by FDA under an Emergency Use Authorization (EUA). This EUA will remain in effect (meaning this test can be used) for the duration of  the COVID-19 declaration under Section 564(b)(1) of the Act, 21 U.S.C. section 360bbb-3(b)(1), unless the authorization is terminated or revoked.  Performed at [facility], [place] [address] [place], [place] [address]   MRSA PCR Screening     Status: None   Collection Time: 06/24/20 11:19 AM   Specimen: Nasopharyngeal  Result Value Ref Range Status   MRSA by PCR NEGATIVE NEGATIVE Final    Comment:        The GeneXpert MRSA Assay (FDA approved for NASAL specimens only), is one component of a comprehensive MRSA colonization surveillance program. It is not intended to diagnose MRSA infection nor to guide or monitor treatment for MRSA infections. Performed at [facility], [place] [address], [place], [place] [address]      Time coordinating discharge: Over 30 minutes  SIGNED:   Shawna Clamp, MD  [facility] 06/28/2020, 2:46 PM Pager   If 7PM-7AM, please contact night-coverage [email or web address]

## 2020-06-28 NOTE — Progress Notes (Signed)
Pt discharged home via EMS stretcher.

## 2020-06-28 NOTE — Care Management (Signed)
[id number] 06-28-20 Case Manager called [facility] at [phone] for transport home. Daughter wanted Case Manager to check on cost for a bedside table for hospital bed at the home. This is an out of pocket expense at $60.00. [employer] to make the daughter aware of cost to see if she wants to go forward with order. Staff RN aware of call placed to [facility]. Case Manager will continue to follow. Bethena Roys, RN,BSN Case Manager

## 2020-06-28 NOTE — Discharge Instructions (Signed)
Advised to follow-up with primary care physician in 1 week Advised to take prednisone for 3 more days to complete 7-day treatment for acute gouty attack. Patient has been discharged home home physical therapy has been arranged.

## 2020-07-03 ENCOUNTER — Ambulatory Visit: Payer: Medicare Other | Admitting: Internal Medicine

## 2020-07-19 ENCOUNTER — Ambulatory Visit: Payer: Medicare Other | Admitting: Internal Medicine

## 2020-07-23 ENCOUNTER — Ambulatory Visit: Payer: Medicare Other | Admitting: Internal Medicine

## 2020-10-17 ENCOUNTER — Encounter (HOSPITAL_COMMUNITY): Payer: Self-pay | Admitting: Student

## 2020-10-17 ENCOUNTER — Emergency Department (HOSPITAL_COMMUNITY): Payer: Medicare Other

## 2020-10-17 ENCOUNTER — Other Ambulatory Visit: Payer: Self-pay

## 2020-10-17 ENCOUNTER — Inpatient Hospital Stay (HOSPITAL_COMMUNITY)
Admission: EM | Admit: 2020-10-17 | Discharge: 2020-10-23 | DRG: 389 | Disposition: A | Discharge status: Skilled Nursing Facility | Payer: Medicare Other | Attending: Internal Medicine | Admitting: Internal Medicine

## 2020-10-17 DIAGNOSIS — I700 Atherosclerosis of aorta: Secondary | ICD-10-CM | POA: Diagnosis present

## 2020-10-17 DIAGNOSIS — K6289 Other specified diseases of anus and rectum: Secondary | ICD-10-CM

## 2020-10-17 DIAGNOSIS — D72829 Elevated white blood cell count, unspecified: Secondary | ICD-10-CM | POA: Diagnosis not present

## 2020-10-17 DIAGNOSIS — F444 Conversion disorder with motor symptom or deficit: Secondary | ICD-10-CM | POA: Diagnosis present

## 2020-10-17 DIAGNOSIS — Z87440 Personal history of urinary (tract) infections: Secondary | ICD-10-CM | POA: Diagnosis not present

## 2020-10-17 DIAGNOSIS — E871 Hypo-osmolality and hyponatremia: Secondary | ICD-10-CM | POA: Diagnosis present

## 2020-10-17 DIAGNOSIS — R109 Unspecified abdominal pain: Secondary | ICD-10-CM | POA: Diagnosis not present

## 2020-10-17 DIAGNOSIS — Z20822 Contact with and (suspected) exposure to covid-19: Secondary | ICD-10-CM | POA: Diagnosis present

## 2020-10-17 DIAGNOSIS — M109 Gout, unspecified: Secondary | ICD-10-CM | POA: Diagnosis present

## 2020-10-17 DIAGNOSIS — Z79899 Other long term (current) drug therapy: Secondary | ICD-10-CM

## 2020-10-17 DIAGNOSIS — E119 Type 2 diabetes mellitus without complications: Secondary | ICD-10-CM

## 2020-10-17 DIAGNOSIS — R200 Anesthesia of skin: Secondary | ICD-10-CM | POA: Diagnosis present

## 2020-10-17 DIAGNOSIS — E872 Acidosis: Secondary | ICD-10-CM | POA: Diagnosis present

## 2020-10-17 DIAGNOSIS — D649 Anemia, unspecified: Secondary | ICD-10-CM | POA: Diagnosis present

## 2020-10-17 DIAGNOSIS — R8271 Bacteriuria: Secondary | ICD-10-CM | POA: Diagnosis not present

## 2020-10-17 DIAGNOSIS — Z8612 Personal history of poliomyelitis: Secondary | ICD-10-CM | POA: Diagnosis not present

## 2020-10-17 DIAGNOSIS — I10 Essential (primary) hypertension: Secondary | ICD-10-CM

## 2020-10-17 DIAGNOSIS — R197 Diarrhea, unspecified: Secondary | ICD-10-CM

## 2020-10-17 DIAGNOSIS — E876 Hypokalemia: Secondary | ICD-10-CM | POA: Diagnosis present

## 2020-10-17 DIAGNOSIS — K5641 Fecal impaction: Principal | ICD-10-CM | POA: Diagnosis present

## 2020-10-17 DIAGNOSIS — E785 Hyperlipidemia, unspecified: Secondary | ICD-10-CM | POA: Diagnosis present

## 2020-10-17 DIAGNOSIS — Z8546 Personal history of malignant neoplasm of prostate: Secondary | ICD-10-CM

## 2020-10-17 DIAGNOSIS — Z7401 Bed confinement status: Secondary | ICD-10-CM

## 2020-10-17 DIAGNOSIS — N179 Acute kidney failure, unspecified: Secondary | ICD-10-CM | POA: Diagnosis not present

## 2020-10-17 DIAGNOSIS — R52 Pain, unspecified: Secondary | ICD-10-CM

## 2020-10-17 LAB — MAGNESIUM: Magnesium: 1.6 mg/dL — ABNORMAL LOW (ref 1.7–2.4)

## 2020-10-17 LAB — URINALYSIS, ROUTINE W REFLEX MICROSCOPIC
Bilirubin Urine: NEGATIVE
Glucose, UA: NEGATIVE mg/dL
Hgb urine dipstick: NEGATIVE
Ketones, ur: NEGATIVE mg/dL
Nitrite: NEGATIVE
Protein, ur: NEGATIVE mg/dL
Specific Gravity, Urine: 1.011 (ref 1.005–1.030)
WBC, UA: >50 WBC/hpf — ABNORMAL HIGH (ref 0–5)
pH: 5 (ref 5.0–8.0)

## 2020-10-17 LAB — COMPREHENSIVE METABOLIC PANEL
ALT: 20 U/L (ref 0–44)
AST: 14 U/L — ABNORMAL LOW (ref 15–41)
Albumin: 3.4 g/dL — ABNORMAL LOW (ref 3.5–5.0)
Alkaline Phosphatase: 75 U/L (ref 38–126)
Anion gap: 11 (ref 5–15)
BUN: 12 mg/dL (ref 8–23)
CO2: 18 mmol/L — ABNORMAL LOW (ref 22–32)
Calcium: 8.7 mg/dL — ABNORMAL LOW (ref 8.9–10.3)
Chloride: 104 mmol/L (ref 98–111)
Creatinine, Ser: 0.81 mg/dL (ref 0.61–1.24)
GFR, Estimated: >60 mL/min (ref >60)
Glucose, Bld: 167 mg/dL — ABNORMAL HIGH (ref 70–99)
Potassium: 2.7 mmol/L — CL (ref 3.5–5.1)
Sodium: 133 mmol/L — ABNORMAL LOW (ref 135–145)
Total Bilirubin: 0.5 mg/dL (ref 0.3–1.2)
Total Protein: 6.6 g/dL (ref 6.5–8.1)

## 2020-10-17 LAB — C DIFFICILE QUICK SCREEN W PCR REFLEX
C Diff antigen: NEGATIVE
C Diff interpretation: NOT DETECTED
C Diff toxin: NEGATIVE

## 2020-10-17 LAB — CBC WITH DIFFERENTIAL/PLATELET
Abs Immature Granulocytes: 0.07 10*3/uL (ref 0.00–0.07)
Basophils Absolute: 0.1 10*3/uL (ref 0.0–0.1)
Basophils Relative: 1 %
Eosinophils Absolute: 0.1 10*3/uL (ref 0.0–0.5)
Eosinophils Relative: 1 %
HCT: 33.3 % — ABNORMAL LOW (ref 39.0–52.0)
Hemoglobin: 11.1 g/dL — ABNORMAL LOW (ref 13.0–17.0)
Immature Granulocytes: 1 %
Lymphocytes Relative: 12 %
Lymphs Abs: 1.3 10*3/uL (ref 0.7–4.0)
MCH: 31.1 pg (ref 26.0–34.0)
MCHC: 33.3 g/dL (ref 30.0–36.0)
MCV: 93.3 fL (ref 80.0–100.0)
Monocytes Absolute: 0.8 10*3/uL (ref 0.1–1.0)
Monocytes Relative: 7 %
Neutro Abs: 8.2 10*3/uL — ABNORMAL HIGH (ref 1.7–7.7)
Neutrophils Relative %: 78 %
Platelets: 290 10*3/uL (ref 150–400)
RBC: 3.57 MIL/uL — ABNORMAL LOW (ref 4.22–5.81)
RDW: 15.6 % — ABNORMAL HIGH (ref 11.5–15.5)
WBC: 10.4 10*3/uL (ref 4.0–10.5)
nRBC: 0 % (ref 0.0–0.2)

## 2020-10-17 LAB — LIPASE, BLOOD: Lipase: 26 U/L (ref 11–51)

## 2020-10-17 MED ORDER — ONDANSETRON HCL 4 MG/2ML IJ SOLN: 4.0000 mg | Freq: Four times a day (QID) | INTRAMUSCULAR | Status: DC | PRN

## 2020-10-17 MED ORDER — ROSUVASTATIN CALCIUM 5 MG PO TABS
5.0000 mg | ORAL_TABLET | Freq: Every evening | ORAL | Status: DC
  Administered 2020-10-18 – 2020-10-22 (×5): 5 mg via ORAL
  Filled 2020-10-17 (×6): qty 1

## 2020-10-17 MED ORDER — ALLOPURINOL 300 MG PO TABS
300.0000 mg | ORAL_TABLET | Freq: Two times a day (BID) | ORAL | Status: DC
  Administered 2020-10-18 – 2020-10-23 (×12): 300 mg via ORAL
  Filled 2020-10-17 (×13): qty 1

## 2020-10-17 MED ORDER — ZOLPIDEM TARTRATE 5 MG PO TABS
5.0000 mg | ORAL_TABLET | Freq: Every evening | ORAL | Status: DC | PRN
  Administered 2020-10-18 – 2020-10-23 (×6): 5 mg via ORAL
  Filled 2020-10-17 (×6): qty 1

## 2020-10-17 MED ORDER — PIPERACILLIN-TAZOBACTAM 3.375 G IVPB
3.3750 g | Freq: Three times a day (TID) | INTRAVENOUS | Status: DC
  Administered 2020-10-18 (×2): 3.375 g via INTRAVENOUS
  Filled 2020-10-17 (×2): qty 50

## 2020-10-17 MED ORDER — POTASSIUM CHLORIDE IN NACL 20-0.9 MEQ/L-% IV SOLN
Freq: Once | INTRAVENOUS | Status: AC
  Filled 2020-10-17: qty 1000

## 2020-10-17 MED ORDER — LACTATED RINGERS IV SOLN: INTRAVENOUS | Status: DC

## 2020-10-17 MED ORDER — LISINOPRIL 20 MG PO TABS
30.0000 mg | ORAL_TABLET | Freq: Every day | ORAL | Status: DC
  Administered 2020-10-18 – 2020-10-23 (×6): 30 mg via ORAL
  Filled 2020-10-17 (×6): qty 1

## 2020-10-17 MED ORDER — SODIUM CHLORIDE 0.9 % IV SOLN: INTRAVENOUS | Status: DC

## 2020-10-17 MED ORDER — ACETAMINOPHEN 325 MG PO TABS
650.0000 mg | ORAL_TABLET | Freq: Four times a day (QID) | ORAL | Status: DC | PRN
  Administered 2020-10-18 – 2020-10-23 (×13): 650 mg via ORAL
  Filled 2020-10-17 (×13): qty 2

## 2020-10-17 MED ORDER — METOPROLOL SUCCINATE ER 100 MG PO TB24
100.0000 mg | ORAL_TABLET | Freq: Every day | ORAL | Status: DC
  Administered 2020-10-18 – 2020-10-23 (×6): 100 mg via ORAL
  Filled 2020-10-17 (×4): qty 1
  Filled 2020-10-17: qty 2
  Filled 2020-10-17: qty 1

## 2020-10-17 MED ORDER — MAGNESIUM SULFATE 2 GM/50ML IV SOLN
2.0000 g | INTRAVENOUS | Status: AC
  Administered 2020-10-17: 2 g via INTRAVENOUS
  Filled 2020-10-17: qty 50

## 2020-10-17 MED ORDER — IOHEXOL 300 MG/ML  SOLN
100.0000 mL | Freq: Once | INTRAMUSCULAR | Status: AC | PRN
  Administered 2020-10-17: 100 mL via INTRAVENOUS

## 2020-10-17 MED ORDER — POTASSIUM CHLORIDE 10 MEQ/100ML IV SOLN
10.0000 meq | INTRAVENOUS | Status: AC
  Administered 2020-10-17 – 2020-10-18 (×5): 10 meq via INTRAVENOUS
  Filled 2020-10-17 (×4): qty 100

## 2020-10-17 MED ORDER — ENOXAPARIN SODIUM 40 MG/0.4ML ~~LOC~~ SOLN
40.0000 mg | SUBCUTANEOUS | Status: DC
  Administered 2020-10-18 – 2020-10-23 (×6): 40 mg via SUBCUTANEOUS
  Filled 2020-10-17 (×6): qty 0.4

## 2020-10-17 MED ORDER — ACETAMINOPHEN 650 MG RE SUPP: 650.0000 mg | Freq: Four times a day (QID) | RECTAL | Status: DC | PRN

## 2020-10-17 MED ORDER — DIAZEPAM 5 MG PO TABS: 5.0000 mg | ORAL_TABLET | Freq: Three times a day (TID) | ORAL | Status: DC | PRN

## 2020-10-17 MED ORDER — AMLODIPINE BESYLATE 5 MG PO TABS
5.0000 mg | ORAL_TABLET | Freq: Every day | ORAL | Status: DC
  Administered 2020-10-18 – 2020-10-23 (×6): 5 mg via ORAL
  Filled 2020-10-17 (×6): qty 1

## 2020-10-17 MED ORDER — POLYETHYLENE GLYCOL 3350 17 G PO PACK
17.0000 g | PACK | Freq: Every day | ORAL | Status: DC
  Administered 2020-10-17 – 2020-10-18 (×2): 17 g via ORAL
  Filled 2020-10-17 (×2): qty 1

## 2020-10-17 MED ORDER — PIPERACILLIN-TAZOBACTAM 3.375 G IVPB 30 MIN
3.3750 g | Freq: Once | INTRAVENOUS | Status: AC
  Administered 2020-10-17: 3.375 g via INTRAVENOUS
  Filled 2020-10-17: qty 50

## 2020-10-17 MED ORDER — ONDANSETRON HCL 4 MG PO TABS: 4.0000 mg | ORAL_TABLET | Freq: Four times a day (QID) | ORAL | Status: DC | PRN

## 2020-10-17 NOTE — H&P (Signed)
History and Physical    Eduardo Moon HUT:[id number] DOB: 1946-04-05 DOA: 10/17/2020  PCP: Timoteo Gaul, FNP   Patient coming from: Home  Chief Complaint: Abdominal pain and diarrhea  HPI: Eduardo Moon is a 75 y.o. male with medical history significant for HTN, Gout who presents with 2 weeks of diarrhea. States was treated for UTI a month ago.   Did not have any change in diet with no new pets or house plants.  He has not had any recent travel.  Unsure why the diarrhea started but reports he has been having 4-6 loose bowel movements a day that did not have mucus mucus or blood in them.  He has not had black tarry stools.  He has had minimal/transient relief with Imodium, Pepto-Bismol.  He does have abdominal pain lower abdomen and states it feels crampy intermittently.  He reports has not slept well the last 2 nights secondary to diarrhea and abdominal pain.  He reports he has been able to eat and drink normally and will usually have quick passage of diarrhea after eating he has not had any sick contacts at home.  No vomiting, there is some nausea.  ED Course: Patient been hemodynamically stable in the emergency room without fever and normal WBC.  CT scan shows distal proctitis with large stool burden.  Case was discussed with gastroenterology by the ER physician.  Gastroenterology will see patient in the morning.  They did recommend patient get a dose of MiraLAX to try and clear out large stool which was given in the emergency room.  Patient was given dose of Zosyn in the emergency room to cover proctitis until GI evaluation morning.  Patient found to have significant potassium loss with a potassium of 2.6.  Magnesium is also mildly low at 1.6 and IV repletion was started in the emergency room  Review of Systems:  General: Denies fever, chills, weight loss, night sweats.  Denies dizziness.  Denies change in appetite HENT: Denies head trauma, headache, denies change in hearing, tinnitus.   Denies nasal bleeding.  Denies sore throat, sores in mouth.  Denies difficulty swallowing Eyes: Denies blurry vision, pain in eye, drainage.  Denies discoloration of eyes. Neck: Denies pain.  Denies swelling.  Denies pain with movement. Cardiovascular: Denies chest pain, palpitations.  Denies edema.  Denies orthopnea Respiratory: Denies shortness of breath, cough.  Denies wheezing.  Denies sputum production Gastrointestinal: Reports lower abdominal pain, Reports diarrhea multiple times a day past two weeks. Denies abdominal swelling.  Denies nausea, vomiting.  Denies melena.  Denies hematemesis. Musculoskeletal: Denies limitation of movement.  Denies deformity or swelling. Denies pain. Denies arthralgias or myalgias. Genitourinary: Denies pelvic pain.  Denies urinary frequency or hesitancy.  Denies dysuria.  Skin: Denies rash.  Denies petechiae, purpura, ecchymosis. Neurological: Denies headache.  Denies syncope. Denies seizure activity. Denies weakness or paresthesia.  Denies slurred speech, drooping face.  Denies visual change. Psychiatric: Denies depression, anxiety. Denies hallucinations.  Past Medical History:  Diagnosis Date  . Arthritis   . History of prostate cancer s/p radiactive seed implants  03-24-2011  . Hypertension   . Mild acid reflux watches diet  . Polio age 1  . Scrotal abscess   . Weakness 05/2020  . 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 ANKLE RECONSTRUCTION  1957   BILATERAL  . ORCHIECTOMY  05/07/2012   Procedure: ORCHIECTOMY;  Surgeon: Bernestine Amass,  MD;  Location: [facility];  Service: Urology;  Laterality: Right;  . RADIOACTIVE PROSTATE SEED IMPLANTS  04-21-2011    DR [facility]   PROSTATE CANCER  . SCROTAL EXPLORATION  05/07/2012   Procedure: SCROTUM EXPLORATION;  Surgeon: Bernestine Amass, MD;  Location: [facility];  Service: Urology;  Laterality: Right;   DRAINAGE OF ABSCESS  1 HR  [employer] MEDICARE    Social History  reports that he has never smoked. He has never used smokeless tobacco. He reports that he does not drink alcohol and does not use drugs.  No Known Allergies  History reviewed. No pertinent family history.   Prior to Admission medications   Medication Sig Start Date End Date Taking? Authorizing Provider  allopurinol (ZYLOPRIM) 300 MG tablet Take 300 mg by mouth 2 (two) times daily. 05/23/20  Yes [provider]  amLODipine (NORVASC) 5 MG tablet Take 1 tablet (5 mg total) by mouth daily. 06/15/20 06/15/21 Yes Hosie Poisson, MD  diazepam (VALIUM) 5 MG tablet Take 5 mg by mouth 3 (three) times daily as needed for anxiety. 05/25/20  Yes [provider]  Diclofenac Sodium CR 100 MG 24 hr tablet Take 100 mg by mouth at bedtime. 09/21/20  Yes [provider]  ferrous sulfate 325 (65 FE) MG tablet Take 1 tablet (325 mg total) by mouth daily with breakfast. 06/29/20  Yes Shawna Clamp, MD  HYDROcodone-acetaminophen (NORCO) 7.5-325 MG tablet Take 1 tablet by mouth daily as needed for pain. 05/23/20  Yes [provider]  lisinopril (ZESTRIL) 30 MG tablet Take 1 tablet (30 mg total) by mouth daily. 06/22/20  Yes Hosie Poisson, MD  metoprolol succinate (TOPROL-XL) 100 MG 24 hr tablet Take 100 mg by mouth daily. 03/22/20  Yes [provider]  omeprazole (PRILOSEC) 20 MG capsule Take 20 mg by mouth 2 (two) times daily. 04/18/20  Yes [provider]  rosuvastatin (CRESTOR) 5 MG tablet Take 5 mg by mouth every evening.  04/23/20  Yes [provider]  vitamin B-12 (CYANOCOBALAMIN) 500 MCG tablet Take 1 tablet (500 mcg total) by mouth daily. 06/29/20  Yes Shawna Clamp, MD  Vitamin D, Ergocalciferol, (DRISDOL) 1.25 MG (50000 UNIT) CAPS capsule Take 50,000 Units by mouth once a week. 04/24/20  Yes [provider]  blood glucose meter kit and supplies KIT Dispense based on patient and  insurance preference. Use up to four times daily as directed. (FOR ICD-9 250.00, 250.01). Patient taking differently: Inject 1 each into the skin See admin instructions. Dispense based on patient and insurance preference. Use up to four times daily as directed. (FOR ICD-9 250.00, 250.01). 06/15/20   Hosie Poisson, MD  predniSONE (DELTASONE) 20 MG tablet Take 2 tablets (40 mg total) by mouth daily with breakfast. Patient not taking: No sig reported 06/29/20   Shawna Clamp, MD  senna (SENOKOT) 8.6 MG TABS tablet Take 1 tablet (8.6 mg total) by mouth 2 (two) times daily. Patient not taking: No sig reported 06/15/20   Hosie Poisson, MD    Physical Exam: Vitals:   10/17/20 1800 10/17/20 1833 10/17/20 1900 10/17/20 2000  BP: (!) 149/90 (!) 161/78 (!) 157/92 (!) 154/78  Pulse: 89 87 81 78  Resp: 16 20 12 19   Temp:      SpO2: 100% 100% 100% 100%  Weight:      Height:        Constitutional: NAD, calm, comfortable Vitals:   10/17/20 1800 10/17/20 1833 10/17/20 1900 10/17/20 2000  BP: (!) 149/90 (!) 161/78 (!) 157/92 (!) 154/78  Pulse: 89 87 81 78  Resp: 16 20 12 19   Temp:      SpO2: 100% 100% 100% 100%  Weight:      Height:       General: WDWN, Alert and oriented x3.  Eyes: EOMI, PERRL, conjunctivae normal.  Sclera nonicteric HENT:  [place]/AT, external ears normal.  Nares patent without epistasis.  Mucous membranes are moist. Posterior pharynx clear of any exudate or lesions.  Neck: Soft, normal range of motion, supple, no masses, no thyromegaly. Trachea midline Respiratory: clear to auscultation bilaterally, no wheezing, no crackles. Normal respiratory effort. No accessory muscle use.  Cardiovascular: Regular rate and rhythm, no murmurs / rubs / gallops. No extremity edema. 2+ pedal pulses.  Abdomen: Soft, Mild lower abdominal tenderness, nondistended, no rebound or guarding.  No masses palpated. Bowel sounds normoactive Musculoskeletal: FROM. no cyanosis. No joint deformity upper and lower  extremitie Normal muscle tone.  Skin: Warm, dry, intact no rashes, lesions, ulcers. No induration Neurologic: CN 2-12 grossly intact. Normal speech. Sensation intact. Strength 5/5 in all extremities.   Psychiatric: Normal judgment and insight.  Normal mood.    Labs on Admission: I have personally reviewed following labs and imaging studies  CBC: Recent Labs  Lab 10/17/20 1400  WBC 10.4  NEUTROABS 8.2*  HGB 11.1*  HCT 33.3*  MCV 93.3  PLT 287    Basic Metabolic Panel: Recent Labs  Lab 10/17/20 1400  NA 133*  K 2.7*  CL 104  CO2 18*  GLUCOSE 167*  BUN 12  CREATININE 0.81  CALCIUM 8.7*  MG 1.6*    GFR: Estimated Creatinine Clearance: 70 mL/min (by C-G formula based on SCr of 0.81 mg/dL).  Liver Function Tests: Recent Labs  Lab 10/17/20 1400  AST 14*  ALT 20  ALKPHOS 75  BILITOT 0.5  PROT 6.6  ALBUMIN 3.4*    Urine analysis:    Component Value Date/Time   COLORURINE YELLOW (A) 10/17/2020 1541   APPEARANCEUR CLOUDY (A) 10/17/2020 1541   LABSPEC 1.011 10/17/2020 1541   PHURINE 5.0 10/17/2020 1541   GLUCOSEU NEGATIVE 10/17/2020 1541   [place] 10/17/2020 1541   BILIRUBINUR NEGATIVE 10/17/2020 1541   KETONESUR NEGATIVE 10/17/2020 1541   PROTEINUR NEGATIVE 10/17/2020 1541   UROBILINOGEN 1.0 04/16/2012 1950   NITRITE NEGATIVE 10/17/2020 1541   LEUKOCYTESUR SMALL (A) 10/17/2020 1541    Radiological Exams on Admission: CT ABDOMEN PELVIS W CONTRAST  Result Date: 10/17/2020 CLINICAL DATA:  Diverticulitis, diarrhea, unspecified abdominal pain EXAM: CT ABDOMEN AND PELVIS WITH CONTRAST TECHNIQUE: Multidetector CT imaging of the abdomen and pelvis was performed using the standard protocol following bolus administration of intravenous contrast. CONTRAST:  118m OMNIPAQUE IOHEXOL 300 MG/ML  SOLN COMPARISON:  06/25/2020 FINDINGS: Lower chest: The visualized lung bases are clear bilaterally. The visualized heart and pericardium are unremarkable. Hepatobiliary: No  focal liver abnormality is seen. No gallstones, gallbladder wall thickening, or biliary dilatation. Pancreas: Unremarkable Spleen: Unremarkable Adrenals/Urinary Tract: The adrenal glands are unremarkable. 2.8 cm simple cortical cyst noted within the upper pole the left kidney. The kidneys are otherwise unremarkable. The bladder is unremarkable. Stomach/Bowel: Large stool within the colon and particularly within the rectal vault with mild perirectal inflammatory stranding and rectal wall thickening possibly reflecting changes of stercoral proctitis. The stomach, small bowel, and large bowel are otherwise unremarkable. Appendix is unremarkable. No free intraperitoneal gas or fluid. Vascular/Lymphatic: Moderate atherosclerotic calcification within the abdominal aorta. No  aortic aneurysm. No pathologic adenopathy within the abdomen and pelvis. Reproductive: Brachytherapy seeds are seen within the prostate gland, unchanged. Seminal vesicles are unremarkable. Other: Tiny fat containing umbilical hernia. Musculoskeletal: There is marked wasting of the left ileo psoas and gluteal musculature. There is wasting of the visualized proximal thigh musculature bilaterally, left greater than right. No acute bone abnormality. IMPRESSION: Large stool within the distal colon and particularly within the rectal vault now with mild rectal wall thickening and perirectal inflammatory stranding suggesting changes of stercoral proctitis. Marked wasting of the lower extremity musculature, left greater than right, unchanged. Aortic Atherosclerosis (ICD10-I70.0). Electronically Signed   By: Fidela Salisbury MD   On: 10/17/2020 16:52    EKG: Independently reviewed.  EKG shows normal sinus rhythm with no acute ST elevation or depression.  No acute T wave changes.  QTc 438  Assessment/Plan Principal Problem:   Hypokalemia Mr. Grima is admitted to Medical telemetry floor. Potassium repletion was started in ER with IV runs of potassium. Most  likely secondary to profuse diarrhea over past two weeks causing GI loss of potassium.  IVF hydration.  Replace magnesium  Recheck electrolytes and renal function with labs in am.  Active Problems:   Proctitis Pt given dose of Zosyn in ER for antibiotic coverage.  GI consulted and will see pt in am. Check CBC in am.     Hypomagnesemia Magnesium mildly low and replaced in ER.     Essential hypertension Continue with home medications of Metoprolol, norvasc and lisinopril. Monitor BP.     Diarrhea Present for past two weeks with no blood. C.Diff test negative. GI consulted and will evaluate in am.     DVT prophylaxis: Lovenox for DVT prophylaxis  Code Status:   Full Code  Family Communication:  Diagnosis and plan discussed with patient.  Patient verbalized understanding and agrees with plan.  Questions answered.  Further recommendations to follow as clinically indicated Disposition Plan:   Patient is from:  Home  Anticipated DC to:  Home  Anticipated DC date:  Anticipate 2 midnights in hospital to treat acute medical condition  Anticipated DC barriers: No  barriers to discharge identified at this time  Consults called:  Gastroenterology, Dr. Fuller Plan, called by ER physician and will see pt in am. Admission status:  Inpatient   Yevonne Aline Chotiner MD [facility]  How to contact the [facility] Attending or Consulting provider [place] or covering provider during after hours [employer], for this patient?   1. Check the care team in [facility] and look for a) attending/consulting [facility] provider listed and b) the [facility] team listed 2. Log into [email or web address] and use 's universal password to access. If you do not have the password, please contact the hospital operator. 3. Locate the [facility] provider you are looking for under [facility] and page to a number that you can be directly reached. 4. If you still have difficulty reaching the provider, please page the [facility] (Director on Call) for  the Hospitalists listed on amion for assistance.  10/17/2020, 8:37 PM

## 2020-10-17 NOTE — ED Triage Notes (Signed)
Patient BIB [facility] from home with diarrhea and abdominal pain for the last 2 weeks.  Called primary care MD who was concern for C.diff so he was sent in.  Patient recently had an inpatient stay for a UTI and has been bed bound since. Patient has lost about 10lbs in the 2 weeks. EMS place 18g left wrist, patient received 1L NS and 1000mg  of tylenol.  Vitals were 100.4 80-HR 160/100 96% on room air 163-CBG 16-RR

## 2020-10-17 NOTE — ED Provider Notes (Signed)
[facility] DEPT Provider Note   CSN: [id number] Arrival date & time: 10/17/20  1317     History Chief Complaint  Patient presents with   Abdominal Pain   Diarrhea    Eduardo Moon is a 75 y.o. male.  HPI Patient with a history of multiple medical issues including recent hospitalization for gout, with possible treatment for urinary tract infection, now presents with 2 weeks of diarrhea.  He notes that there is no clear precipitant, and since onset he has had minimal/transient relief with Imodium, Pepto-Bismol.  He does have abdominal pain, seemingly anterior, lower, but he cannot describe additional characteristics.  This is seemingly waxing, waning severity.  He notes that with all attempts at oral intake, liquid, solid, he has swift passage and has had multiple episodes of loose stool daily, now for 2 weeks. No vomiting, there is some nausea.     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   uses w/c for distance    Patient Active Problem List   Diagnosis Date Noted   Anemia 06/22/2020   AKI (acute kidney injury) ([place]) 06/22/2020   Bradycardia 06/22/2020   Hypokalemia 06/22/2020   Leukocytosis (leucocytosis) 06/22/2020   Limited mobility 06/22/2020   Knee pain 06/22/2020   HTN (hypertension) 06/11/2020   Gout attack 06/11/2020   Acute lower UTI 06/11/2020   Weakness 06/11/2020   UTI (urinary tract infection) 06/11/2020   DM2 (diabetes mellitus, type 2) ([place]) 06/11/2020   HLD (hyperlipidemia) 06/11/2020   Acute prerenal azotemia 06/11/2020   Epididymo-orchitis, acute 05/07/2012   Scrotal abscess 05/07/2012    Past Surgical History:  Procedure Laterality Date   ANKLE RECONSTRUCTION  1957    BILATERAL   ORCHIECTOMY  05/07/2012   Procedure: ORCHIECTOMY;  Surgeon: Bernestine Amass, MD;  Location: [facility];  Service: Urology;  Laterality: Right;   RADIOACTIVE PROSTATE SEED IMPLANTS  04-21-2011    DR Risa Grill   PROSTATE CANCER   SCROTAL EXPLORATION  05/07/2012   Procedure: SCROTUM EXPLORATION;  Surgeon: Bernestine Amass, MD;  Location: [facility];  Service: Urology;  Laterality: Right;  DRAINAGE OF ABSCESS  1 HR  [employer] MEDICARE       History reviewed. No pertinent family history.  Social History   Tobacco Use   Smoking status: Never Smoker   Smokeless tobacco: Never Used  Vaping Use   Vaping Use: Never used  Substance Use Topics   Alcohol use: No   Drug use: No    Home Medications Prior to Admission medications   Medication Sig Start Date End Date Taking? Authorizing Provider  allopurinol (ZYLOPRIM) 300 MG tablet Take 300 mg by mouth 2 (two) times daily. 05/23/20  Yes [provider]  amLODipine (NORVASC) 5 MG tablet Take 1 tablet (5 mg total) by mouth daily. 06/15/20 06/15/21 Yes Hosie Poisson, MD  diazepam (VALIUM) 5 MG tablet Take 5 mg by mouth 3 (three) times daily as needed for anxiety. 05/25/20  Yes [provider]  Diclofenac Sodium CR 100 MG 24 hr tablet Take 100 mg by mouth at bedtime. 09/21/20  Yes [provider]  ferrous sulfate 325 (65 FE) MG tablet Take 1 tablet (325 mg  total) by mouth daily with breakfast. 06/29/20  Yes Shawna Clamp, MD  HYDROcodone-acetaminophen (NORCO) 7.5-325 MG tablet Take 1 tablet by mouth daily as needed for pain. 05/23/20  Yes [provider]  lisinopril (ZESTRIL) 30 MG tablet Take 1 tablet (30 mg total) by mouth daily. 06/22/20  Yes Hosie Poisson, MD  metoprolol succinate (TOPROL-XL) 100 MG 24 hr tablet Take 100 mg by mouth daily. 03/22/20  Yes [provider]  omeprazole (PRILOSEC) 20 MG capsule Take 20 mg by mouth 2 (two) times daily. 04/18/20  Yes  [provider]  rosuvastatin (CRESTOR) 5 MG tablet Take 5 mg by mouth every evening.  04/23/20  Yes [provider]  vitamin B-12 (CYANOCOBALAMIN) 500 MCG tablet Take 1 tablet (500 mcg total) by mouth daily. 06/29/20  Yes Shawna Clamp, MD  Vitamin D, Ergocalciferol, (DRISDOL) 1.25 MG (50000 UNIT) CAPS capsule Take 50,000 Units by mouth once a week. 04/24/20  Yes [provider]  blood glucose meter kit and supplies KIT Dispense based on patient and insurance preference. Use up to four times daily as directed. (FOR ICD-9 250.00, 250.01). Patient taking differently: Inject 1 each into the skin See admin instructions. Dispense based on patient and insurance preference. Use up to four times daily as directed. (FOR ICD-9 250.00, 250.01). 06/15/20   Hosie Poisson, MD  predniSONE (DELTASONE) 20 MG tablet Take 2 tablets (40 mg total) by mouth daily with breakfast. Patient not taking: No sig reported 06/29/20   Shawna Clamp, MD  senna (SENOKOT) 8.6 MG TABS tablet Take 1 tablet (8.6 mg total) by mouth 2 (two) times daily. Patient not taking: No sig reported 06/15/20   Hosie Poisson, MD    Allergies    Patient has no known allergies.  Review of Systems   Review of Systems  Constitutional:       Per HPI, otherwise negative  HENT:       Per HPI, otherwise negative  Respiratory:       Per HPI, otherwise negative  Cardiovascular:       Per HPI, otherwise negative  Gastrointestinal: Positive for abdominal pain and diarrhea. Negative for vomiting.  Endocrine:       Negative aside from HPI  Genitourinary:       Neg aside from HPI   Musculoskeletal:       Per HPI, otherwise negative  Skin: Negative.   Neurological: Negative for syncope.       Left-sided weakness since polio as a child    Physical Exam Updated Vital Signs BP (!) 142/74    Pulse 71    Temp 98.1 F (36.7 C)    Resp 17    Ht 5' 6" (1.676 m)    Wt 68 kg    SpO2 100%    BMI 24.21 kg/m   Physical  Exam Vitals and nursing note reviewed.  Constitutional:      General: He is not in acute distress.    Appearance: He is well-developed.  HENT:     Head: Normocephalic and atraumatic.  Eyes:     Conjunctiva/sclera: Conjunctivae normal.  Cardiovascular:     Rate and Rhythm: Normal rate and regular rhythm.  Pulmonary:     Effort: Pulmonary effort is normal. No respiratory distress.     Breath sounds: No stridor.  Abdominal:     General: There is no distension.     Tenderness: There is abdominal tenderness in the suprapubic area.     Comments: Mild tenderness to  palpation, no guarding, no rebound  Skin:    General: Skin is warm and dry.  Neurological:     Mental Status: He is alert and oriented to person, place, and time.     ED Results / Procedures / Treatments   Labs (all labs ordered are listed, but only abnormal results are displayed) Labs Reviewed  COMPREHENSIVE METABOLIC PANEL - Abnormal; Notable for the following components:      Result Value   Sodium 133 (*)    Potassium 2.7 (*)    CO2 18 (*)    Glucose, Bld 167 (*)    Calcium 8.7 (*)    Albumin 3.4 (*)    AST 14 (*)    All other components within normal limits  CBC WITH DIFFERENTIAL/PLATELET - Abnormal; Notable for the following components:   RBC 3.57 (*)    Hemoglobin 11.1 (*)    HCT 33.3 (*)    RDW 15.6 (*)    Neutro Abs 8.2 (*)    All other components within normal limits  URINALYSIS, ROUTINE W REFLEX MICROSCOPIC - Abnormal; Notable for the following components:   Color, Urine YELLOW (*)    APPearance CLOUDY (*)    Leukocytes,Ua SMALL (*)    WBC, UA >50 (*)    Bacteria, UA MANY (*)    Non Squamous Epithelial 0-5 (*)    All other components within normal limits  MAGNESIUM - Abnormal; Notable for the following components:   Magnesium 1.6 (*)    All other components within normal limits  C DIFFICILE QUICK SCREEN W PCR REFLEX  GASTROINTESTINAL PANEL BY PCR, STOOL (REPLACES STOOL CULTURE)  LIPASE, BLOOD     EKG None  Radiology CT ABDOMEN PELVIS W CONTRAST  Result Date: 10/17/2020 CLINICAL DATA:  Diverticulitis, diarrhea, unspecified abdominal pain EXAM: CT ABDOMEN AND PELVIS WITH CONTRAST TECHNIQUE: Multidetector CT imaging of the abdomen and pelvis was performed using the standard protocol following bolus administration of intravenous contrast. CONTRAST:  153m OMNIPAQUE IOHEXOL 300 MG/ML  SOLN COMPARISON:  06/25/2020 FINDINGS: Lower chest: The visualized lung bases are clear bilaterally. The visualized heart and pericardium are unremarkable. Hepatobiliary: No focal liver abnormality is seen. No gallstones, gallbladder wall thickening, or biliary dilatation. Pancreas: Unremarkable Spleen: Unremarkable Adrenals/Urinary Tract: The adrenal glands are unremarkable. 2.8 cm simple cortical cyst noted within the upper pole the left kidney. The kidneys are otherwise unremarkable. The bladder is unremarkable. Stomach/Bowel: Large stool within the colon and particularly within the rectal vault with mild perirectal inflammatory stranding and rectal wall thickening possibly reflecting changes of stercoral proctitis. The stomach, small bowel, and large bowel are otherwise unremarkable. Appendix is unremarkable. No free intraperitoneal gas or fluid. Vascular/Lymphatic: Moderate atherosclerotic calcification within the abdominal aorta. No aortic aneurysm. No pathologic adenopathy within the abdomen and pelvis. Reproductive: Brachytherapy seeds are seen within the prostate gland, unchanged. Seminal vesicles are unremarkable. Other: Tiny fat containing umbilical hernia. Musculoskeletal: There is marked wasting of the left ileo psoas and gluteal musculature. There is wasting of the visualized proximal thigh musculature bilaterally, left greater than right. No acute bone abnormality. IMPRESSION: Large stool within the distal colon and particularly within the rectal vault now with mild rectal wall thickening and perirectal  inflammatory stranding suggesting changes of stercoral proctitis. Marked wasting of the lower extremity musculature, left greater than right, unchanged. Aortic Atherosclerosis (ICD10-I70.0). Electronically Signed   By: AFidela SalisburyMD   On: 10/17/2020 16:52    Procedures Procedures   Medications Ordered in ED Medications  0.9 %  sodium chloride infusion ( Intravenous New Bag/Given 10/17/20 1539)  0.9 % NaCl with KCl 20 mEq/ L  infusion ( Intravenous New Bag/Given 10/17/20 1658)  iohexol (OMNIPAQUE) 300 MG/ML solution 100 mL (100 mLs Intravenous Contrast Given 10/17/20 1605)    ED Course  I have reviewed the triage vital signs and the nursing notes.  Pertinent labs & imaging results that were available during my care of the patient were reviewed by me and considered in my medical decision making (see chart for details).    5:33 PM Patient now has no abdominal pain.  He does describe some pain in his buttocks.  His evaluation there is notable for some skin breakdown, but no appreciable cutaneous infection.  He is now committed by his daughter.  We discussed all findings, including CT suggesting stercoral proctitis, urinalysis with some consideration of infection, labs otherwise notable for mild hypokalemia.  8:00 PM I discussed the patient's CT results with him, his daughter, and with our GI colleague, Dr. Fuller Plan.  Given concern for hypokalemia, proctitis, patient will be admitted.  In regards the patient's ongoing diarrhea, some suspicion for overflow diarrhea.  Patient will start MiraLAX. GI team will follow as a consulting service.  MDM Rules/Calculators/A&P  Final Clinical Impression(s) / ED Diagnoses Final diagnoses:  Proctitis  Hypokalemia     Carmin Muskrat, MD 10/17/20 2003

## 2020-10-17 NOTE — ED Notes (Signed)
Patient transported to CT 

## 2020-10-18 DIAGNOSIS — I10 Essential (primary) hypertension: Secondary | ICD-10-CM

## 2020-10-18 DIAGNOSIS — R197 Diarrhea, unspecified: Secondary | ICD-10-CM

## 2020-10-18 LAB — BASIC METABOLIC PANEL
Anion gap: 8 (ref 5–15)
BUN: 8 mg/dL (ref 8–23)
CO2: 18 mmol/L — ABNORMAL LOW (ref 22–32)
Calcium: 8.3 mg/dL — ABNORMAL LOW (ref 8.9–10.3)
Chloride: 106 mmol/L (ref 98–111)
Creatinine, Ser: 0.68 mg/dL (ref 0.61–1.24)
GFR, Estimated: >60 mL/min (ref >60)
Glucose, Bld: 97 mg/dL (ref 70–99)
Potassium: 3.6 mmol/L (ref 3.5–5.1)
Sodium: 132 mmol/L — ABNORMAL LOW (ref 135–145)

## 2020-10-18 LAB — GASTROINTESTINAL PANEL BY PCR, STOOL (REPLACES STOOL CULTURE)

## 2020-10-18 LAB — CBC
HCT: 30.2 % — ABNORMAL LOW (ref 39.0–52.0)
Hemoglobin: 9.9 g/dL — ABNORMAL LOW (ref 13.0–17.0)
MCH: 30.9 pg (ref 26.0–34.0)
MCHC: 32.8 g/dL (ref 30.0–36.0)
MCV: 94.4 fL (ref 80.0–100.0)
Platelets: 258 10*3/uL (ref 150–400)
RBC: 3.2 MIL/uL — ABNORMAL LOW (ref 4.22–5.81)
RDW: 15.6 % — ABNORMAL HIGH (ref 11.5–15.5)
WBC: 10.3 10*3/uL (ref 4.0–10.5)
nRBC: 0 % (ref 0.0–0.2)

## 2020-10-18 LAB — HEPATIC FUNCTION PANEL
ALT: 19 U/L (ref 0–44)
AST: 8 U/L — ABNORMAL LOW (ref 15–41)
Albumin: 2.9 g/dL — ABNORMAL LOW (ref 3.5–5.0)
Alkaline Phosphatase: 64 U/L (ref 38–126)
Bilirubin, Direct: 0.1 mg/dL (ref 0.0–0.2)
Indirect Bilirubin: 0.3 mg/dL (ref 0.3–0.9)
Total Bilirubin: 0.4 mg/dL (ref 0.3–1.2)
Total Protein: 5.5 g/dL — ABNORMAL LOW (ref 6.5–8.1)

## 2020-10-18 LAB — MAGNESIUM: Magnesium: 1.7 mg/dL (ref 1.7–2.4)

## 2020-10-18 LAB — PHOSPHORUS: Phosphorus: 2.3 mg/dL — ABNORMAL LOW (ref 2.5–4.6)

## 2020-10-18 LAB — SARS CORONAVIRUS 2 (TAT 6-24 HRS): SARS Coronavirus 2: NEGATIVE

## 2020-10-18 MED ORDER — POLYETHYLENE GLYCOL 3350 17 G PO PACK
17.0000 g | PACK | Freq: Two times a day (BID) | ORAL | Status: DC
  Administered 2020-10-18 – 2020-10-20 (×4): 17 g via ORAL
  Filled 2020-10-18 (×4): qty 1

## 2020-10-18 MED ORDER — MINERAL OIL RE ENEM
1.0000 | ENEMA | Freq: Once | RECTAL | Status: AC
  Administered 2020-10-18: 1 via RECTAL
  Filled 2020-10-18: qty 1

## 2020-10-18 MED ORDER — K PHOS MONO-SOD PHOS DI & MONO 155-852-130 MG PO TABS
500.0000 mg | ORAL_TABLET | Freq: Two times a day (BID) | ORAL | Status: AC
  Administered 2020-10-18 (×2): 500 mg via ORAL
  Filled 2020-10-18 (×3): qty 2

## 2020-10-18 MED ORDER — POTASSIUM CHLORIDE CRYS ER 20 MEQ PO TBCR
40.0000 meq | EXTENDED_RELEASE_TABLET | Freq: Two times a day (BID) | ORAL | Status: AC
  Administered 2020-10-18 (×2): 40 meq via ORAL
  Filled 2020-10-18 (×2): qty 2

## 2020-10-18 MED ORDER — GERHARDT'S BUTT CREAM
TOPICAL_CREAM | CUTANEOUS | Status: DC | PRN
  Filled 2020-10-18: qty 1

## 2020-10-18 MED ORDER — MAGNESIUM SULFATE 2 GM/50ML IV SOLN
2.0000 g | Freq: Once | INTRAVENOUS | Status: AC
  Administered 2020-10-18: 2 g via INTRAVENOUS
  Filled 2020-10-18: qty 50

## 2020-10-18 NOTE — Progress Notes (Signed)
Rehab Admissions Coordinator Note:  Patient was screened by Cleatrice Burke for appropriateness for an Inpatient Acute Rehab Consult per therapy recs. I will discuss case with Dr. Naaman Plummer and follow up with our recommendations.Cleatrice Burke RN MSN 10/18/2020, 4:52 PM  I can be reached at [phone].

## 2020-10-18 NOTE — TOC Initial Note (Signed)
Transition of Care [facility]) - Initial/Assessment Note    Patient Details  Name: Eduardo Moon MRN: [id number] Date of Birth: 06/10/1946  [facility] of [facility]) CM/SW Contact:    Dessa Phi, RN Phone Number: 10/18/2020, 3:23 PM  Clinical Narrative:  PT recc CIR-await cons & eval by CIR.                 Expected Discharge Plan: IP Rehab Facility Barriers to Discharge: Continued Medical Work up   Patient Goals and CMS Choice Patient states their goals for this hospitalization and ongoing recovery are:: go home CMS Medicare.gov Compare Post Acute Care list provided to:: Patient Choice offered to / list presented to : Patient  Expected Discharge Plan and Services Expected Discharge Plan: [place]   Discharge Planning Services: CM Consult   Living arrangements for the past 2 months: Single Family Home                                      Prior Living Arrangements/Services Living arrangements for the past 2 months: Single Family Home Lives with:: Relatives   Do you feel safe going back to the place where you live?: Yes      Need for Family Participation in Patient Care: No (Comment) Care giver support system in place?: Yes (comment)   Criminal Activity/Legal Involvement Pertinent to Current Situation/Hospitalization: No - Comment as needed  Activities of Daily Living Home Assistive Devices/Equipment: Wheelchair,Walker (specify type),Crutches,Eyeglasses ADL Screening (condition at time of admission) Patient's cognitive ability adequate to safely complete daily activities?: Yes Is the patient deaf or have difficulty hearing?: No Does the patient have difficulty seeing, even when wearing glasses/contacts?: No Does the patient have difficulty concentrating, remembering, or making decisions?: No Patient able to express need for assistance with ADLs?: Yes Does the patient have difficulty dressing or bathing?: Yes Independently performs ADLs?:  No Communication: Independent Dressing (OT): Needs assistance Is this a change from baseline?: Pre-admission baseline Grooming: Independent Feeding: Independent Bathing: Needs assistance Is this a change from baseline?: Pre-admission baseline Toileting: Needs assistance Is this a change from baseline?: Pre-admission baseline In/Out Bed: Needs assistance Is this a change from baseline?: Pre-admission baseline Walks in Home: Dependent Is this a change from baseline?: Pre-admission baseline Does the patient have difficulty walking or climbing stairs?: Yes Weakness of Legs: Both Weakness of Arms/Hands: Both  Permission Sought/Granted Permission sought to share information with : Case Manager Permission granted to share information with : Yes, Verbal Permission Granted  Share Information with NAME: Case Manager           Emotional Assessment Appearance:: Appears stated age Attitude/Demeanor/Rapport: Gracious Affect (typically observed): Accepting Orientation: : Oriented to Self,Oriented to Place,Oriented to  Time,Oriented to Situation Alcohol / Substance Use: Not Applicable Psych Involvement: No (comment)  Admission diagnosis:  Hypokalemia [E87.6] Proctitis [K62.89] Patient Active Problem List   Diagnosis Date Noted  . 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 Hypomagnesemia 10/17/2020  . Essential hypertension 10/17/2020  . Diarrhea 10/17/2020  . Anemia 06/22/2020  . AKI (acute kidney injury) ([place]) 06/22/2020  . Bradycardia 06/22/2020  . Hypokalemia 06/22/2020  . Leukocytosis (leucocytosis) 06/22/2020  . Limited mobility 06/22/2020  . Knee pain 06/22/2020  . HTN (hypertension) 06/11/2020  . Gout attack 06/11/2020  . Acute lower UTI 06/11/2020  . Weakness 06/11/2020  . UTI (urinary tract infection) 06/11/2020  . DM2 (diabetes mellitus, type 2) ([place]) 06/11/2020  .  HLD (hyperlipidemia) 06/11/2020  . Acute prerenal azotemia 06/11/2020  . Epididymo-orchitis, acute 05/07/2012  . Scrotal  abscess 05/07/2012   PCP:  Timoteo Gaul, FNP Pharmacy:   [id number] - [place], [place] [address] [place] [address] Phone: [phone] Fax: [phone]     Social Determinants of Health (SDOH) Interventions    Readmission Risk Interventions Readmission Risk Prevention Plan 06/26/2020 06/13/2020  Post Dischage Appt - Complete  Medication Screening - Complete  Transportation Screening Complete Complete  PCP or Specialist Appt within 3-5 Days Complete -  HRI or Home Care Consult Complete -  Social Work Consult for [place] Planning/Counseling Complete -  Palliative Care Screening Not Applicable -  Medication Review (RN Care Manager) Complete -  Some recent data might be hidden

## 2020-10-18 NOTE — Evaluation (Signed)
Physical Therapy Evaluation Patient Details Name: Eduardo Moon MRN: [id number] DOB: [birth year] Today's Date: 10/18/2020   History of Present Illness  Pt is a 75 year old male admitted for "Diarrhea" due to fecal impaction and "overflow." (per GI)  Pt with significant PMH of polio as a child with L sided weakness and L KAFO, HTN, scrotal abcess s/p removal of testicle, bil ankle reconstruction as a child (88-72 years old), prostate CA s/p seed imp.  Clinical Impression  Pt admitted with above diagnosis.  Pt currently with functional limitations due to the deficits listed below (see PT Problem List). Pt will benefit from skilled PT to increase their independence and safety with mobility to allow discharge to the venue listed below.   Pt reports working with HHPT 2x/week for approx one month on LE strengthening and transfers to w/c.  Pt agreeable to session and would like d/c with more intense post acute rehab as his goal is to be able to walk again.  Pt states he was ambulatory with crutches prior to October 2021.     Follow Up Recommendations CIR    Equipment Recommendations  None recommended by PT    Recommendations for Other Services       Precautions / Restrictions Precautions Precautions: Fall Precaution Comments: left KAFO      Mobility  Bed Mobility Overal bed mobility: Needs Assistance Bed Mobility: Supine to Sit     Supine to sit: Mod assist     General bed mobility comments: assist for trunk upright and Lt LE over EOB, requested pt scoot laterally up [facility] however he returned to supine with trunk and required assist for LEs  Upon return to bed, observed BM on linen and notified nurse tech.    Transfers                 General transfer comment: pt reports he is typically able to perform lateral scoot into w/c  Ambulation/Gait             General Gait Details: pt states he has not stood or ambulated since October  Stairs            Wheelchair  Mobility    Modified Rankin (Stroke Patients Only)       Balance Overall balance assessment: Needs assistance Sitting-balance support: No upper extremity supported;Feet supported Sitting balance-Leahy Scale: Fair                                       Pertinent Vitals/Pain Pain Assessment: No/denies pain    Home Living Family/patient expects to be discharged to:: Private residence Living Arrangements: Children (daughter)   Type of Home: House Home Access: Stairs to enter   Technical brewer of Steps: 3 Home Layout: One level Home Equipment: Crutches;Wheelchair - [employer];Hospital bed Additional Comments: does not drive, does not leave the house unless for MD appointments, likes old country western movies.      Prior Function Level of Independence: Needs assistance         Comments: from admission 3 months ago: "pt typically able to perform household ambulation with axillary crutches; able to bath/dress himself"  pt reports currently using "diaper" and staying in bed however has been working with HHPT the past month on strengthening and lateral/scoot transfers     Hand Dominance        Extremity/Trunk Assessment  Lower Extremity Assessment Lower Extremity Assessment: LLE deficits/detail;RLE deficits/detail RLE Deficits / Details: grossly 2/5 throughout at best, pt unable to perform even 25% of AROM against gravity sitting EOB LLE Deficits / Details: flaccid left LE, no active movement observed    Cervical / Trunk Assessment Cervical / Trunk Assessment: Normal  Communication   Communication: No difficulties  Cognition Arousal/Alertness: Awake/alert Behavior During Therapy: WFL for tasks assessed/performed Overall Cognitive Status: Within Functional Limits for tasks assessed                                        General Comments      Exercises     Assessment/Plan    PT Assessment Patient needs  continued PT services  PT Problem List Decreased strength;Decreased mobility;Decreased activity tolerance;Decreased balance;Decreased knowledge of use of DME       PT Treatment Interventions DME instruction;Therapeutic exercise;Stair training;Functional mobility training;Therapeutic activities;Patient/family education;Balance training;Wheelchair mobility training;Neuromuscular re-education    PT Goals (Current goals can be found in the Care Plan section)  Acute Rehab PT Goals PT Goal Formulation: With patient Time For Goal Achievement: 11/01/20 Potential to Achieve Goals: Good    Frequency Min 3X/week   Barriers to discharge        Co-evaluation               AM-PAC PT "6 Clicks" Mobility  Outcome Measure Help needed turning from your back to your side while in a flat bed without using bedrails?: A Lot Help needed moving from lying on your back to sitting on the side of a flat bed without using bedrails?: A Lot Help needed moving to and from a bed to a chair (including a wheelchair)?: A Lot Help needed standing up from a chair using your arms (e.g., wheelchair or bedside chair)?: A Lot Help needed to walk in hospital room?: Total Help needed climbing 3-5 steps with a railing? : Total 6 Click Score: 10    End of Session   Activity Tolerance: Patient tolerated treatment well Patient left: in bed;with call bell/phone within reach;with bed alarm set Nurse Communication: Mobility status PT Visit Diagnosis: Muscle weakness (generalized) (M62.81);Other abnormalities of gait and mobility (R26.89)    Time: 1450-1502 PT Time Calculation (min) (ACUTE ONLY): 12 min   Charges:   PT Evaluation $PT Eval Low Complexity: 1 Low     Kati PT, DPT Acute Rehabilitation Services Pager: [phone] Office: [phone]  York Ram E 10/18/2020, 3:22 PM

## 2020-10-18 NOTE — Consult Note (Incomplete)
Consultation  Referring Provider: [place] Primary Care Physician:  Timoteo Gaul, FNP Primary Gastroenterologist:  unassigned  Reason for Consultation:    HPI: Eduardo Moon is a 75 y.o. male    Past Medical History:  Diagnosis Date  . Arthritis   . History of prostate cancer s/p radiactive seed implants  03-24-2011  . Hypertension   . Mild acid reflux watches diet  . Polio age 4  . Scrotal abscess   . Weakness 05/2020  . 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 ANKLE RECONSTRUCTION  1957   BILATERAL  . ORCHIECTOMY  05/07/2012   Procedure: ORCHIECTOMY;  Surgeon: Bernestine Amass, MD;  Location: [facility];  Service: Urology;  Laterality: Right;  . RADIOACTIVE PROSTATE SEED IMPLANTS  04-21-2011    [facility]   PROSTATE CANCER  . SCROTAL EXPLORATION  05/07/2012   Procedure: SCROTUM EXPLORATION;  Surgeon: Bernestine Amass, MD;  Location: [facility] [place];  Service: Urology;  Laterality: Right;  DRAINAGE OF ABSCESS  1 HR  [employer] MEDICARE    Prior to Admission medications   Medication Sig Start Date End Date Taking? Authorizing Provider  allopurinol (ZYLOPRIM) 300 MG tablet Take 300 mg by mouth 2 (two) times daily. 05/23/20  Yes [provider]  amLODipine (NORVASC) 5 MG tablet Take 1 tablet (5 mg total) by mouth daily. 06/15/20 06/15/21 Yes Hosie Poisson, MD  diazepam (VALIUM) 5 MG tablet Take 5 mg by mouth 3 (three) times daily as needed for anxiety. 05/25/20  Yes [provider]  Diclofenac Sodium CR 100 MG 24 hr tablet Take 100 mg by mouth at bedtime. 09/21/20  Yes [provider]  ferrous sulfate 325 (65 FE) MG tablet Take 1 tablet (325 mg total) by mouth daily with breakfast. 06/29/20  Yes Shawna Clamp, MD  HYDROcodone-acetaminophen (NORCO) 7.5-325 MG tablet Take 1 tablet by mouth daily as needed for pain. 05/23/20  Yes [provider]  lisinopril (ZESTRIL) 30 MG tablet Take 1 tablet (30 mg total) by mouth daily. 06/22/20  Yes Hosie Poisson, MD  metoprolol succinate (TOPROL-XL) 100 MG 24 hr tablet Take 100 mg by mouth daily. 03/22/20  Yes [provider]  omeprazole (PRILOSEC) 20 MG capsule Take 20 mg by mouth 2 (two) times daily. 04/18/20  Yes [provider]  rosuvastatin (CRESTOR) 5 MG tablet Take 5 mg by mouth every evening.  04/23/20  Yes [provider]  vitamin B-12 (CYANOCOBALAMIN) 500 MCG tablet Take 1 tablet (500 mcg total) by mouth daily. 06/29/20  Yes Shawna Clamp, MD  Vitamin D, Ergocalciferol, (DRISDOL) 1.25 MG (50000 UNIT) CAPS capsule Take 50,000 Units by mouth once a week. 04/24/20  Yes [provider]  blood glucose meter kit and supplies KIT Dispense based on patient and insurance preference. Use up to four times daily as directed. (FOR ICD-9 250.00, 250.01). Patient taking differently: Inject 1 each into the skin See admin instructions. Dispense based on patient and insurance preference. Use up to four times daily as directed. (FOR ICD-9 250.00, 250.01). 06/15/20   Hosie Poisson, MD  predniSONE (DELTASONE) 20 MG tablet Take 2 tablets (40 mg total) by mouth daily with breakfast. Patient not taking: No sig reported 06/29/20   Shawna Clamp, MD  senna (SENOKOT) 8.6 MG TABS tablet Take 1 tablet (8.6 mg total) by mouth 2 (two) times daily. Patient not taking:  No sig reported 06/15/20   Hosie Poisson, MD    Current Facility-Administered Medications  Medication Dose Route Frequency Provider Last Rate Last Admin  . acetaminophen (TYLENOL) tablet 650 mg  650 mg Oral Q6H PRN Chotiner, Yevonne Aline, MD   650 mg at 10/18/20 9485   Or  . acetaminophen (TYLENOL) suppository 650 mg  650 mg Rectal Q6H PRN Chotiner, Yevonne Aline, MD      . allopurinol (ZYLOPRIM) tablet 300 mg  300 mg Oral BID Chotiner, Yevonne Aline, MD   300 mg at 10/18/20 4627  . amLODipine (NORVASC) tablet 5 mg  5 mg  Oral Daily Chotiner, Yevonne Aline, MD   5 mg at 10/18/20 0350  . diazepam (VALIUM) tablet 5 mg  5 mg Oral TID PRN Chotiner, Yevonne Aline, MD      . enoxaparin (LOVENOX) injection 40 mg  40 mg Subcutaneous Q24H Chotiner, Yevonne Aline, MD   40 mg at 10/18/20 0938  . lactated ringers infusion   Intravenous Continuous Chotiner, Yevonne Aline, MD 125 mL/hr at 10/18/20 0916 New Bag at 10/18/20 0916  . lisinopril (ZESTRIL) tablet 30 mg  30 mg Oral Daily Chotiner, Yevonne Aline, MD   30 mg at 10/18/20 1829  . metoprolol succinate (TOPROL-XL) 24 hr tablet 100 mg  100 mg Oral Daily Chotiner, Yevonne Aline, MD   100 mg at 10/18/20 9371  . ondansetron (ZOFRAN) tablet 4 mg  4 mg Oral Q6H PRN Chotiner, Yevonne Aline, MD       Or  . ondansetron [facility]) injection 4 mg  4 mg Intravenous Q6H PRN Chotiner, Yevonne Aline, MD      . piperacillin-tazobactam (ZOSYN) IVPB 3.375 g  3.375 g Intravenous Q8H Chotiner, Yevonne Aline, MD 12.5 mL/hr at 10/18/20 0441 Infusion Verify at 10/18/20 0441  . polyethylene glycol (MIRALAX / GLYCOLAX) packet 17 g  17 g Oral Daily Carmin Muskrat, MD   17 g at 10/18/20 6967  . rosuvastatin (CRESTOR) tablet 5 mg  5 mg Oral QPM Chotiner, Yevonne Aline, MD      . zolpidem (AMBIEN) tablet 5 mg  5 mg Oral QHS PRN Chotiner, Yevonne Aline, MD   5 mg at 10/18/20 0048    Allergies as of 10/17/2020  . (No Known Allergies)    History reviewed. No pertinent family history.  Social History   Socioeconomic History  . Marital status: Single    Spouse name: Not on file  . Number of children: Not on file  . Years of education: Not on file  . Highest education level: Not on file  Occupational History  . Not on file  Tobacco Use  . Smoking status: Never Smoker  . Smokeless tobacco: Never Used  Vaping Use  . Vaping Use: Never used  Substance and Sexual Activity  . Alcohol use: No  . Drug use: No  . Sexual activity: Not on file  Other Topics Concern  . Not on file  Social History Narrative  . Not on file   Social  Determinants of Health   Financial Resource Strain: Not on file  Food Insecurity: Not on file  Transportation Needs: Not on file  Physical Activity: Not on file  Stress: Not on file  Social Connections: Not on file  Intimate Partner Violence: Not on file    Review of Systems: Pertinent positive and negative review of systems were noted in the above HPI section.  All other review of systems was otherwise negative.  Physical Exam: Vital signs in last  24 hours: Temp:  [98.1 F (36.7 C)-98.7 F (37.1 C)] 98.7 F (37.1 C) (03/24 0911) Pulse Rate:  [68-93] 76 (03/24 0911) Resp:  [11-23] 20 (03/24 0911) BP: (129-161)/(69-92) 135/74 (03/24 0911) SpO2:  [99 %-100 %] 99 % (03/24 0911) Weight:  [68 kg] 68 kg (03/23 1345) Last BM Date: 10/18/20 General:   Alert,  Well-developed, well-nourished, pleasant and cooperative in NAD Head:  Normocephalic and atraumatic. Eyes:  Sclera clear, no icterus.   Conjunctiva pink. Ears:  Normal auditory acuity. Nose:  No deformity, discharge,  or lesions. Mouth:  No deformity or lesions.   Neck:  Supple; no masses or thyromegaly. Lungs:  Clear throughout to auscultation.   No wheezes, crackles, or rhonchi. Heart:  Regular rate and rhythm; no murmurs, clicks, rubs,  or gallops. Abdomen:  Soft,nontender, BS active,nonpalp mass or hsm.   Rectal:  Deferred  Msk:  Symmetrical without gross deformities. . Pulses:  Normal pulses noted. Extremities:  Without clubbing or edema. Neurologic:  Alert and  oriented x4;  grossly normal neurologically. Skin:  Intact without significant lesions or rashes.. Psych:  Alert and cooperative. Normal mood and affect.  Intake/Output from previous day: 03/23 0701 - 03/24 0700 In: 3149.6 [I.V.:2562.3; IV Piggyback:587.3] Out: 775 [Urine:775] Intake/Output this shift: Total I/O In: -  Out: 1000 [Urine:1000]  Lab Results: Recent Labs    10/17/20 1400 10/18/20 0526  WBC 10.4 10.3  HGB 11.1* 9.9*  HCT 33.3* 30.2*   PLT 290 258   BMET Recent Labs    10/17/20 1400 10/18/20 0526  NA 133* 132*  K 2.7* 3.6  CL 104 106  CO2 18* 18*  GLUCOSE 167* 97  BUN 12 8  CREATININE 0.81 0.68  CALCIUM 8.7* 8.3*   LFT Recent Labs    10/18/20 0803  PROT 5.5*  ALBUMIN 2.9*  AST 8*  ALT 19  ALKPHOS 64  BILITOT 0.4  BILIDIR 0.1  IBILI 0.3   PT/INR No results for input(s): LABPROT, INR in the last 72 hours. Hepatitis Panel No results for input(s): HEPBSAG, HCVAB, HEPAIGM, HEPBIGM in the last 72 hours.    IMPRESSION:  ***  PLAN: ***   Amy EsterwoodPA-C  10/18/2020, 10:38 AM

## 2020-10-18 NOTE — Consult Note (Signed)
Referring Provider: [facility] Primary Care Physician:  Timoteo Gaul, FNP Primary Gastroenterologist:  Dr. Clarene Essex  Reason for Consultation: Diarrhea  HPI: Eduardo Moon is a 75 y.o. male admitted to the hospital yesterday because of a roughly 2-week history of diarrhea, characterized by several loose bowel movements per day, including nocturnally.  C. difficile is negative.  He received antibiotics for UTI shortly prior to the onset of diarrhea.  He reports some discomfort at the anus.  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  He has not been able to walk for approximately the past 5 months.  He wears diapers in the bed.  He occasionally gets in a wheelchair.  The patient is on several constipating medications including hydrocodone and iron supplement.  His home medications indicate that he is on senna, although the patient did not mention that to me.  He does indicate he takes MiraLAX but has been inconsistent in his use of it recently.  His CT in the emergency room showed a large amount of stool in the rectum, with some questionable proctitis.   Past Medical History:  Diagnosis Date  . Arthritis   . History of prostate cancer s/p radiactive seed implants  03-24-2011  . Hypertension   . Mild acid reflux watches diet  . Polio age 66  . Scrotal abscess   . Weakness 05/2020  . 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 ANKLE RECONSTRUCTION  1957   BILATERAL  . ORCHIECTOMY  05/07/2012   Procedure: ORCHIECTOMY;  Surgeon: Bernestine Amass, MD;  [facility]: [facility];  Service: Urology;  Laterality: Right;  . RADIOACTIVE PROSTATE SEED IMPLANTS  04-21-2011    [facility]   PROSTATE CANCER  . SCROTAL EXPLORATION  05/07/2012   Procedure: SCROTUM EXPLORATION;  Surgeon: Bernestine Amass, MD;  Location: [facility];   Service: Urology;  Laterality: Right;  DRAINAGE OF ABSCESS  1 HR  [employer] MEDICARE    Prior to Admission medications   Medication Sig Start Date End Date Taking? Authorizing Provider  allopurinol (ZYLOPRIM) 300 MG tablet Take 300 mg by mouth 2 (two) times daily. 05/23/20  Yes [provider]  amLODipine (NORVASC) 5 MG tablet Take 1 tablet (5 mg total) by mouth daily. 06/15/20 06/15/21 Yes Hosie Poisson, MD  diazepam (VALIUM) 5 MG tablet Take 5 mg by mouth 3 (three) times daily as needed for anxiety. 05/25/20  Yes [provider]  Diclofenac Sodium CR 100 MG 24 hr tablet Take 100 mg by mouth at bedtime. 09/21/20  Yes [provider]  ferrous sulfate 325 (65 FE) MG tablet Take 1 tablet (325 mg total) by mouth daily with breakfast. 06/29/20  Yes Shawna Clamp, MD  HYDROcodone-acetaminophen (NORCO) 7.5-325 MG tablet Take 1 tablet by mouth daily as needed for pain. 05/23/20  Yes [provider]  lisinopril (ZESTRIL) 30 MG tablet Take 1 tablet (30 mg total) by mouth daily. 06/22/20  Yes Hosie Poisson, MD  metoprolol succinate (TOPROL-XL) 100 MG 24 hr tablet Take 100 mg by mouth daily. 03/22/20  Yes [provider]  omeprazole (PRILOSEC) 20 MG capsule Take 20 mg by mouth 2 (two) times daily. 04/18/20  Yes [provider]  rosuvastatin (CRESTOR) 5 MG tablet Take 5 mg by mouth every evening.  04/23/20  Yes [provider]  vitamin B-12 (CYANOCOBALAMIN) 500 MCG tablet Take 1 tablet (500 mcg total) by mouth daily. 06/29/20  Yes Kumar, Pardeep, MD  Vitamin D, Ergocalciferol, (DRISDOL) 1.25 MG (50000 UNIT) CAPS capsule Take 50,000 Units by mouth once a week. 04/24/20  Yes [provider]  blood glucose meter kit and supplies KIT Dispense based on patient and insurance preference. Use up to four times daily as directed. (FOR ICD-9 250.00, 250.01). Patient taking differently: Inject 1 each into the skin See admin instructions. Dispense based on  patient and insurance preference. Use up to four times daily as directed. (FOR ICD-9 250.00, 250.01). 06/15/20   Akula, Vijaya, MD  predniSONE (DELTASONE) 20 MG tablet Take 2 tablets (40 mg total) by mouth daily with breakfast. Patient not taking: No sig reported 06/29/20   Kumar, Pardeep, MD  senna (SENOKOT) 8.6 MG TABS tablet Take 1 tablet (8.6 mg total) by mouth 2 (two) times daily. Patient not taking: No sig reported 06/15/20   Akula, Vijaya, MD    Current Facility-Administered Medications  Medication Dose Route Frequency Provider Last Rate Last Admin  . acetaminophen (TYLENOL) tablet 650 mg  650 mg Oral Q6H PRN Chotiner, Bradley S, MD   650 mg at 10/18/20 0923   Or  . acetaminophen (TYLENOL) suppository 650 mg  650 mg Rectal Q6H PRN Chotiner, Bradley S, MD      . allopurinol (ZYLOPRIM) tablet 300 mg  300 mg Oral BID Chotiner, Bradley S, MD   300 mg at 10/18/20 0922  . amLODipine (NORVASC) tablet 5 mg  5 mg Oral Daily Chotiner, Bradley S, MD   5 mg at 10/18/20 0923  . diazepam (VALIUM) tablet 5 mg  5 mg Oral TID PRN Chotiner, Bradley S, MD      . enoxaparin (LOVENOX) injection 40 mg  40 mg Subcutaneous Q24H Chotiner, Bradley S, MD   40 mg at 10/18/20 0923  . [name]'s butt cream   Topical PRN Sheikh, Omair Latif, DO      . lactated ringers infusion   Intravenous Continuous Chotiner, Bradley S, MD 125 mL/hr at 10/18/20 0916 New Bag at 10/18/20 0916  . lisinopril (ZESTRIL) tablet 30 mg  30 mg Oral Daily Chotiner, Bradley S, MD   30 mg at 10/18/20 0922  . magnesium sulfate IVPB 2 g 50 mL  2 g Intravenous Once Sheikh, Omair Latif, DO 50 mL/hr at 10/18/20 1339 2 g at 10/18/20 1339  . metoprolol succinate (TOPROL-XL) 24 hr tablet 100 mg  100 mg Oral Daily Chotiner, Bradley S, MD   100 mg at 10/18/20 0923  . mineral oil enema 1 enema  1 enema Rectal Once Buccini, Robert, MD      . ondansetron (ZOFRAN) tablet 4 mg  4 mg Oral Q6H PRN Chotiner, Bradley S, MD       Or  . ondansetron (ZOFRAN)  injection 4 mg  4 mg Intravenous Q6H PRN Chotiner, Bradley S, MD      . phosphorus (K PHOS NEUTRAL) tablet 500 mg  500 mg Oral BID Sheikh, Omair Latif, DO      . piperacillin-tazobactam (ZOSYN) IVPB 3.375 g  3.375 g Intravenous Q8H Chotiner, Bradley S, MD 12.5 mL/hr at 10/18/20 0441 Infusion Verify at 10/18/20 0441  . polyethylene glycol (MIRALAX / GLYCOLAX) packet 17 g  17 g Oral BID Buccini, Robert, MD      . potassium chloride SA (KLOR-CON) CR tablet 40 mEq  40 mEq Oral BID Sheikh, Omair Latif, DO   40 mEq   at 10/18/20 1341  . rosuvastatin (CRESTOR) tablet 5 mg  5 mg Oral QPM Chotiner, Bradley S, MD      . zolpidem (AMBIEN) tablet 5 mg  5 mg Oral QHS PRN Chotiner, Bradley S, MD   5 mg at 10/18/20 0048    Allergies as of 10/17/2020  . (No Known Allergies)    History reviewed. No pertinent family history.  Social History   Socioeconomic History  . Marital status: Single    Spouse name: Not on file  . Number of children: Not on file  . Years of education: Not on file  . Highest education level: Not on file  Occupational History  . Not on file  Tobacco Use  . Smoking status: Never Smoker  . Smokeless tobacco: Never Used  Vaping Use  . Vaping Use: Never used  Substance and Sexual Activity  . Alcohol use: No  . Drug use: No  . Sexual activity: Not on file  Other Topics Concern  . Not on file  Social History Narrative  . Not on file   Social Determinants of Health   Financial Resource Strain: Not on file  Food Insecurity: Not on file  Transportation Needs: Not on file  Physical Activity: Not on file  Stress: Not on file  Social Connections: Not on file  Intimate Partner Violence: Not on file    Review of Systems: Negative for fever, chest pain, dyspnea, focal neurologic deficits, skin rashes, lower extremity swelling (other than that associated with his gouty arthritis), urinary symptoms, lymphadenopathy.  Physical Exam: Vital signs in last 24 hours: Temp:  [98.2 F  (36.8 C)-98.7 F (37.1 C)] 98.7 F (37.1 C) (03/24 0911) Pulse Rate:  [68-92] 76 (03/24 0911) Resp:  [11-21] 20 (03/24 0911) BP: (129-161)/(69-92) 135/74 (03/24 0911) SpO2:  [99 %-100 %] 99 % (03/24 0911) Last BM Date: 10/18/20 General:   Alert,  reasonably well-developed, well-nourished, pleasant and cooperative in NAD Head:  Normocephalic and atraumatic. Eyes:  Sclera clear, no icterus.   Conjunctiva pink. Neck:   No masses or thyromegaly. Lungs:  Clear throughout to auscultation.   No wheezes, crackles, or rhonchi. No evident respiratory distress. Heart:   Regular rate and rhythm; no murmurs, clicks, rubs,  or gallops. Abdomen:  Soft, nontender, nontympanitic, and nondistended. No masses, hepatosplenomegaly or ventral hernias noted.  Rectal: Massive fecal impaction, at least the size of a grapefruit, of firm, clay consistency stool, brown in color.  This was manually disimpacted to the point that I could reach, although I got the impression an equal amount of stool was out of reach of my finger.  A small amount of fresh mucoid red blood was seen on the examining finger.  The amount of stool successfully delivered was about the size of an orange Msk:   Symmetrical without gross deformities. Pulses:  Normal radial pulse is noted. Extremities:   Without clubbing, cyanosis, or edema. Neurologic:  Alert and coherent;  grossly normal neurologically. Skin:  Intact without significant lesions or rashes. Cervical Nodes:  No significant cervical adenopathy. Psych:   Alert and cooperative. Normal mood and affect.  Intake/Output from previous day: 03/23 0701 - 03/24 0700 In: 3149.6 [I.V.:2562.3; IV Piggyback:587.3] Out: 775 [Urine:775] Intake/Output this shift: Total I/O In: -  Out: 1000 [Urine:1000]  Lab Results: Recent Labs    10/17/20 1400 10/18/20 0526  WBC 10.4 10.3  HGB 11.1* 9.9*  HCT 33.3* 30.2*  PLT 290 258   BMET Recent Labs      10/17/20 1400 10/18/20 0526  NA 133*  132*  K 2.7* 3.6  CL 104 106  CO2 18* 18*  GLUCOSE 167* 97  BUN 12 8  CREATININE 0.81 0.68  CALCIUM 8.7* 8.3*   LFT Recent Labs    10/18/20 0803  PROT 5.5*  ALBUMIN 2.9*  AST 8*  ALT 19  ALKPHOS 64  BILITOT 0.4  BILIDIR 0.1  IBILI 0.3   PT/INR No results for input(s): LABPROT, INR in the last 72 hours.  Studies/Results: CT ABDOMEN PELVIS W CONTRAST  Result Date: 10/17/2020 CLINICAL DATA:  Diverticulitis, diarrhea, unspecified abdominal pain EXAM: CT ABDOMEN AND PELVIS WITH CONTRAST TECHNIQUE: Multidetector CT imaging of the abdomen and pelvis was performed using the standard protocol following bolus administration of intravenous contrast. CONTRAST:  100mL OMNIPAQUE IOHEXOL 300 MG/ML  SOLN COMPARISON:  06/25/2020 FINDINGS: Lower chest: The visualized lung bases are clear bilaterally. The visualized heart and pericardium are unremarkable. Hepatobiliary: No focal liver abnormality is seen. No gallstones, gallbladder wall thickening, or biliary dilatation. Pancreas: Unremarkable Spleen: Unremarkable Adrenals/Urinary Tract: The adrenal glands are unremarkable. 2.8 cm simple cortical cyst noted within the upper pole the left kidney. The kidneys are otherwise unremarkable. The bladder is unremarkable. Stomach/Bowel: Large stool within the colon and particularly within the rectal vault with mild perirectal inflammatory stranding and rectal wall thickening possibly reflecting changes of stercoral proctitis. The stomach, small bowel, and large bowel are otherwise unremarkable. Appendix is unremarkable. No free intraperitoneal gas or fluid. Vascular/Lymphatic: Moderate atherosclerotic calcification within the abdominal aorta. No aortic aneurysm. No pathologic adenopathy within the abdomen and pelvis. Reproductive: Brachytherapy seeds are seen within the prostate gland, unchanged. Seminal vesicles are unremarkable. Other: Tiny fat containing umbilical hernia. Musculoskeletal: There is marked wasting  of the left ileo psoas and gluteal musculature. There is wasting of the visualized proximal thigh musculature bilaterally, left greater than right. No acute bone abnormality. IMPRESSION: Large stool within the distal colon and particularly within the rectal vault now with mild rectal wall thickening and perirectal inflammatory stranding suggesting changes of stercoral proctitis. Marked wasting of the lower extremity musculature, left greater than right, unchanged. Aortic Atherosclerosis (ICD10-I70.0). Electronically Signed   By: Ashesh  Parikh MD   On: 10/17/2020 16:52    Impression: "Diarrhea" due to fecal impaction and "overflow."  Fecal impaction presumably a consequence of being bed confined and on constipating medication with an inadequate adherence to a bowel regimen; in addition, the patient was hypokalemic on admission, which might have contributed to decreased colonic motility..  Plan: 1.  Partial digital disimpaction achieved as described above 2.  Minimal oil retention enema to try to soften the remaining stool 3.  Increase MiraLAX to twice a day 4.  Repeat disimpaction by nursing staff later, after the mineral oil has had a chance to work 5.  Reassess the patient tomorrow, with consideration for cleanout from above with a Nulytely purge if there is significant stool remaining and it has softened 6.  I have discontinued enteric/contact precautions since this patient shows no evidence of diarrhea due to an infectious process 7.  I would consider stopping the patient's antibiotics if they are being given for reasons of a possible GI process.  They can be continued if needed for some other reason.   LOS: 1 day   Robert V Buccini  10/18/2020, 2:15 PM   Pager [phone] If no answer or after 5 PM call [phone] 

## 2020-10-18 NOTE — Progress Notes (Signed)
Mineral oil enema given.  Small amount of formed stool returned.

## 2020-10-18 NOTE — Progress Notes (Signed)
PROGRESS NOTE    Eduardo Moon  QIH:[id number] DOB: 1946/07/07 DOA: 10/17/2020 PCP: Timoteo Gaul, FNP  Brief Narrative:  The patient is a 75 year old Caucasian male with a past medical history significant for hypertension, arthritis, history of prostate cancer status post radioactive seeds, history of gouty arthritis and bedbound status and functional paraplegia who presented with a 2-1/2 history week of continuous diarrhea.  He states that he was treated for a UTI a month ago.  He is unsure why the diarrhea started but he has been having 4-6 loose bowel movement a day did not have mucus or blood in him.  Denies any black tarry stools and had minimal transient relief with Imodium and Pepto-Bismol.  He does have some abdominal pain and states this feels like it is cramping intermittently.  He also reported that he is not sleeping well the last few nights due to his diarrhea and abdominal pain and states that he has been able to drink and eat normally but usually well quick passage of diarrhea after eating.  He is brought to the ED for abdominal pain and diarrhea and work-up and ended up having a CT scan which did show some distal proctitis with a large stool burden.  Gastroenterology was consulted and his electrolytes are replete given that they are low on admission.  GI evaluated and did a partial digital disimpaction and ordered a mineral oil retention enema as well as increased his laxatives.  Antibiotics have been stopped as GI feels that he has no infectious etiology for his diarrhea.  Assessment & Plan:   Principal Problem:   Hypokalemia Active Problems:   Proctitis   Hypomagnesemia   Essential hypertension   Diarrhea  Diarrhea secondary to overflow diarrhea in the setting of fecal impaction -Patient was admitted as inpatient to telemetry and gastroenterology has been consulted -CT Abd/Pelvis showed "Large stool within the distal colon and particularly within the rectal vault now with  mild rectal wall thickening and perirectal inflammatory stranding suggesting changes of stercoral proctitis. Marked wasting of the lower extremity musculature, left greater than right, unchanged.  Aortic Atherosclerosis."  -He had some low electrolytes with a potassium of 2.7 and a magnesium of 1.6 -Gastroenterology evaluated the patient and did a partial digital disimpaction and recommended a mineral oil retention enema to try and soften the remaining stool and Dr. Cristina Gong recommended increasing MiraLAX to twice a day dosing -It was advised for the patient to have a repeat disimpaction later on after the mineral oil has not had a chance to work and if the patient still has impacted mold there evaluate and recommend a Nulytely purge -Is enteric and contact precautions has been discontinued as the evidence of diarrhea is not secondary to an infectious process as the C. difficile was negative and because GI pathogen panel was negative -[name]'s Butt Cream ordered   Hypokalemia -Patient's potassium on admission was 2.7 and after repletion improved to 3.6 -Continue further repletion with p.o. potassium chloride 40 mEq twice daily x2 doses as well as p.o. K-Phos Neutral 500 mg p.o. twice daily x2 -Continue to monitor and replete as necessary -Patient also has IV fluid hydration with lactated Ringer's at 125 mL per hour -Magnesium are low at 1.7 so will replete with IV mag sulfate 2 g -Repeat CMP in a.m.  Hypomagnesemia -The patient's magnesium level is 1.6 after repletion was 1.7 this morning -continue to further replete with IV mag sulfate 2 g -Continue to monitor and replete as necessary -Repeat  magnesium level in a.m.  Hypophosphatemia -Patient's phosphorus level was 2.3 -Replete with p.o. K-Phos Neutral 500 mg p.o. twice daily x2 doses  -Continue to monitor and replete as necessary -Repeat CMP in a.m.  Proctitis, ruled out -Pt given dose of Zosyn in ER for antibiotic coverage.  -GI  consulted and will see pt in am and will discontinue antibiotics now given that this is less likely infectious and likely in setting of his massive fecal impaction retention -Patient is afebrile and has no leukocytosis and WBC is 10.3  Essential Hypertension -Continue with home medications of Metoprolol Succinate 100 mg po daily, Amlodipine 5 mg po Daily and Lisinopril 30 mg po Daily -Continue to Monitor BP per Protocol -Last BP was 136/72  Functional Paraplegia -Has mainly been bedbound and not ambulated since October -PT/OT to evaluate and Treat  Hyponatremia -Mild -Patient is sodium went from 133 is now 132 -Continue with LR as above but may need to change normal saline -Continue to monitor and trend and repeat CMP in a.m.  Metabolic Acidosis -Mild -Patient's CO2 is 18, anion gap is 8, chloride level is 106 and likely in the setting of his diarrhea -Continue to monitor and trend and repeat CMP in a.m.  Gouty Arthritis -C/w Allopurinol 300 mg po BID  HLD -C/w Rosuvastatin 5 mg po qHS   Normocytic Anemia -Patient's Hgb/Hct went from 11.1/33.3 -> 9.9/30.2 -Check Anemia Panel in the AM  -Continue to Monitor for S/Sx of Bleeding; Currently no overt bleeding noted -Repeat CBC in the AM    DVT prophylaxis: Enoxaparin 40 mg sq q24h Code Status: FULL CODE  Family Communication: No family present  Disposition Plan: Pending further clinical improvement and evaluation by PT/OT   Status is: Inpatient  Remains inpatient appropriate because:Unsafe d/c plan, IV treatments appropriate due to intensity of illness or inability to take PO and Inpatient level of care appropriate due to severity of illness   Dispo: The patient is from: Home              Anticipated d/c is to: TBD              Patient currently is not medically stable to d/c.   Difficult to place patient No  Consultants:   Gastroenterology   Procedures: None  Antimicrobials:  Anti-infectives (From admission,  onward)   Start     Dose/Rate Route Frequency Ordered Stop   10/18/20 0400  piperacillin-tazobactam (ZOSYN) IVPB 3.375 g        3.375 g 12.5 mL/hr over 240 Minutes Intravenous Every 8 hours 10/17/20 2313 10/18/20 2159   10/17/20 2015  piperacillin-tazobactam (ZOSYN) IVPB 3.375 g        3.375 g 100 mL/hr over 30 Minutes Intravenous  Once 10/17/20 2005 10/17/20 2124        Subjective: Seen and examined at bedside and he was sitting up in bed complaining of continuing to have some diarrhea.  No nausea or vomiting.  Had some mild abdominal pain but none now.  Drinking milk.  No other concerns or complaints at this time.  Objective: Vitals:   10/18/20 0010 10/18/20 0424 10/18/20 0911 10/18/20 1421  BP: 129/75 134/77 135/74 136/72  Pulse: 68 75 76 78  Resp: 17 18 20 18   Temp: 98.2 F (36.8 C) 98.4 F (36.9 C) 98.7 F (37.1 C) 98.3 F (36.8 C)  TempSrc: Oral Oral Oral Oral  SpO2: 100% 100% 99% 99%  Weight:      Height:  Intake/Output Summary (Last 24 hours) at 10/18/2020 1446 Last data filed at 10/18/2020 7106 Gross per 24 hour  Intake 3149.57 ml  Output 1775 ml  Net 1374.57 ml   Filed Weights   10/17/20 1345  Weight: 68 kg   Examination: Physical Exam:  Constitutional: WN/WD Caucasian male currently no acute distress Eyes: Lids and conjunctivae normal, sclerae anicteric  ENMT: External Ears, Nose appear normal. Grossly normal hearing.  Neck: Appears normal, supple, no cervical masses, normal ROM, no appreciable thyromegaly; no JVD Respiratory: Slightly diminished to auscultation bilaterally, no wheezing, rales, rhonchi or crackles. Normal respiratory effort and patient is not tachypenic. No accessory muscle use.  Cardiovascular: RRR, no murmurs / rubs / gallops. S1 and S2 auscultated. No extremity edema. 2+ pedal pulses. No carotid bruits.  Abdomen: Soft, non-tender, mildly distended. Bowel sounds positive.  GU: Deferred. Musculoskeletal: No clubbing / cyanosis of  digits/nails. No joint deformity upper and lower extremities.  Skin: No rashes, lesions, ulcers on limited skin evaluation. No induration; Warm and dry.  Neurologic: CN 2-12 grossly intact with no focal deficits. Romberg sign and cerebellar reflexes not assessed.  Psychiatric: Normal judgment and insight. Alert and oriented x 3. Normal mood and appropriate affect.   Data Reviewed: I have personally reviewed following labs and imaging studies  CBC: Recent Labs  Lab 10/17/20 1400 10/18/20 0526  WBC 10.4 10.3  NEUTROABS 8.2*  --   HGB 11.1* 9.9*  HCT 33.3* 30.2*  MCV 93.3 94.4  PLT 290 269   Basic Metabolic Panel: Recent Labs  Lab 10/17/20 1400 10/18/20 0526 10/18/20 0803  NA 133* 132*  --   K 2.7* 3.6  --   CL 104 106  --   CO2 18* 18*  --   GLUCOSE 167* 97  --   BUN 12 8  --   CREATININE 0.81 0.68  --   CALCIUM 8.7* 8.3*  --   MG 1.6*  --  1.7  PHOS  --   --  2.3*   GFR: Estimated Creatinine Clearance: 70.9 mL/min (by C-G formula based on SCr of 0.68 mg/dL). Liver Function Tests: Recent Labs  Lab 10/17/20 1400 10/18/20 0803  AST 14* 8*  ALT 20 19  ALKPHOS 75 64  BILITOT 0.5 0.4  PROT 6.6 5.5*  ALBUMIN 3.4* 2.9*   Recent Labs  Lab 10/17/20 1400  LIPASE 26   No results for input(s): AMMONIA in the last 168 hours. Coagulation Profile: No results for input(s): INR, PROTIME in the last 168 hours. Cardiac Enzymes: No results for input(s): CKTOTAL, CKMB, CKMBINDEX, TROPONINI in the last 168 hours. BNP (last 3 results) No results for input(s): PROBNP in the last 8760 hours. HbA1C: No results for input(s): HGBA1C in the last 72 hours. CBG: No results for input(s): GLUCAP in the last 168 hours. Lipid Profile: No results for input(s): CHOL, HDL, LDLCALC, TRIG, CHOLHDL, LDLDIRECT in the last 72 hours. Thyroid Function Tests: No results for input(s): TSH, T4TOTAL, FREET4, T3FREE, THYROIDAB in the last 72 hours. Anemia Panel: No results for input(s):  VITAMINB12, FOLATE, FERRITIN, TIBC, IRON, RETICCTPCT in the last 72 hours. Sepsis Labs: No results for input(s): PROCALCITON, LATICACIDVEN in the last 168 hours.  Recent Results (from the past 240 hour(s))  C Difficile Quick Screen w PCR reflex     Status: None   Collection Time: 10/17/20  3:41 PM   Specimen: STOOL  Result Value Ref Range Status   C Diff antigen NEGATIVE NEGATIVE Final   C  Diff toxin NEGATIVE NEGATIVE Final   C Diff interpretation No C. difficile detected.  Final    Comment: Performed at [facility], [place] [address], [place], [place] [address]  Gastrointestinal Panel by PCR , Stool     Status: None   Collection Time: 10/17/20  3:41 PM   Specimen: Stool  Result Value Ref Range Status   Campylobacter species NOT DETECTED NOT DETECTED Final   Plesimonas shigelloides NOT DETECTED NOT DETECTED Final   Salmonella species NOT DETECTED NOT DETECTED Final   Yersinia enterocolitica NOT DETECTED NOT DETECTED Final   Vibrio species NOT DETECTED NOT DETECTED Final   Vibrio cholerae NOT DETECTED NOT DETECTED Final   Enteroaggregative E coli (EAEC) NOT DETECTED NOT DETECTED Final   Enteropathogenic E coli (EPEC) NOT DETECTED NOT DETECTED Final   Enterotoxigenic E coli (ETEC) NOT DETECTED NOT DETECTED Final   Shiga like toxin producing E coli (STEC) NOT DETECTED NOT DETECTED Final   Shigella/Enteroinvasive E coli (EIEC) NOT DETECTED NOT DETECTED Final   Cryptosporidium NOT DETECTED NOT DETECTED Final   Cyclospora cayetanensis NOT DETECTED NOT DETECTED Final   Entamoeba histolytica NOT DETECTED NOT DETECTED Final   Giardia lamblia NOT DETECTED NOT DETECTED Final   Adenovirus F40/41 NOT DETECTED NOT DETECTED Final   Astrovirus NOT DETECTED NOT DETECTED Final   Norovirus GI/GII NOT DETECTED NOT DETECTED Final   Rotavirus A NOT DETECTED NOT DETECTED Final   Sapovirus (I, II, IV, and V) NOT DETECTED NOT DETECTED Final    Comment: Performed at [facility],  [address] [place], [place] [address]  SARS CORONAVIRUS 2 (TAT 6-24 HRS) Nasopharyngeal Nasopharyngeal Swab     Status: None   Collection Time: 10/17/20  8:20 PM   Specimen: Nasopharyngeal Swab  Result Value Ref Range Status   SARS Coronavirus 2 NEGATIVE NEGATIVE Final    Comment: (NOTE) SARS-CoV-2 target nucleic acids are NOT DETECTED.  The SARS-CoV-2 RNA is generally detectable in upper and lower respiratory specimens during the acute phase of infection. Negative results do not preclude SARS-CoV-2 infection, do not rule out co-infections with other pathogens, and should not be used as the sole basis for treatment or other patient management decisions. Negative results must be combined with clinical observations, patient history, and epidemiological information. The expected result is Negative.  Fact Sheet for Patients: SugarRoll.be  Fact Sheet for Healthcare Providers: https://www.woods-mathews.com/  This test is not yet approved or cleared by the [place] FDA and  has been authorized for detection and/or diagnosis of SARS-CoV-2 by FDA under an Emergency Use Authorization (EUA). This EUA will remain  in effect (meaning this test can be used) for the duration of the COVID-19 declaration under Se ction 564(b)(1) of the Act, 21 U.S.C. section 360bbb-3(b)(1), unless the authorization is terminated or revoked sooner.  Performed at [facility], [place] [address] [place],  [address]    RN Pressure Injury Documentation:     Estimated body mass index is 24.21 kg/m as calculated from the following:   Height as of this encounter: 5\' 6"  (1.676 m).   Weight as of this encounter: 68 kg.  Malnutrition Type:    Malnutrition Characteristics:   Nutrition Interventions:    Radiology Studies: CT ABDOMEN PELVIS W CONTRAST  Result Date: 10/17/2020 CLINICAL DATA:  Diverticulitis, diarrhea, unspecified abdominal pain  EXAM: CT ABDOMEN AND PELVIS WITH CONTRAST TECHNIQUE: Multidetector CT imaging of the abdomen and pelvis was performed using the standard protocol following bolus  administration of intravenous contrast. CONTRAST:  186mL OMNIPAQUE IOHEXOL 300 MG/ML  SOLN COMPARISON:  06/25/2020 FINDINGS: Lower chest: The visualized lung bases are clear bilaterally. The visualized heart and pericardium are unremarkable. Hepatobiliary: No focal liver abnormality is seen. No gallstones, gallbladder wall thickening, or biliary dilatation. Pancreas: Unremarkable Spleen: Unremarkable Adrenals/Urinary Tract: The adrenal glands are unremarkable. 2.8 cm simple cortical cyst noted within the upper pole the left kidney. The kidneys are otherwise unremarkable. The bladder is unremarkable. Stomach/Bowel: Large stool within the colon and particularly within the rectal vault with mild perirectal inflammatory stranding and rectal wall thickening possibly reflecting changes of stercoral proctitis. The stomach, small bowel, and large bowel are otherwise unremarkable. Appendix is unremarkable. No free intraperitoneal gas or fluid. Vascular/Lymphatic: Moderate atherosclerotic calcification within the abdominal aorta. No aortic aneurysm. No pathologic adenopathy within the abdomen and pelvis. Reproductive: Brachytherapy seeds are seen within the prostate gland, unchanged. Seminal vesicles are unremarkable. Other: Tiny fat containing umbilical hernia. Musculoskeletal: There is marked wasting of the left ileo psoas and gluteal musculature. There is wasting of the visualized proximal thigh musculature bilaterally, left greater than right. No acute bone abnormality. IMPRESSION: Large stool within the distal colon and particularly within the rectal vault now with mild rectal wall thickening and perirectal inflammatory stranding suggesting changes of stercoral proctitis. Marked wasting of the lower extremity musculature, left greater than right, unchanged.  Aortic Atherosclerosis (ICD10-I70.0). Electronically Signed   By: Fidela Salisbury MD   On: 10/17/2020 16:52   Scheduled Meds: . allopurinol  300 mg Oral BID  . amLODipine  5 mg Oral Daily  . enoxaparin (LOVENOX) injection  40 mg Subcutaneous Q24H  . lisinopril  30 mg Oral Daily  . metoprolol succinate  100 mg Oral Daily  . mineral oil  1 enema Rectal Once  . phosphorus  500 mg Oral BID  . polyethylene glycol  17 g Oral BID  . potassium chloride  40 mEq Oral BID  . rosuvastatin  5 mg Oral QPM   Continuous Infusions: . lactated ringers 125 mL/hr at 10/18/20 0916  . piperacillin-tazobactam (ZOSYN)  IV 12.5 mL/hr at 10/18/20 0441    LOS: 1 day   Kerney Elbe, DO [facility] PAGER is on [place]  If 7PM-7AM, please contact night-coverage [email or web address]

## 2020-10-19 DIAGNOSIS — D649 Anemia, unspecified: Secondary | ICD-10-CM

## 2020-10-19 DIAGNOSIS — R8271 Bacteriuria: Secondary | ICD-10-CM

## 2020-10-19 LAB — CBC WITH DIFFERENTIAL/PLATELET
Abs Immature Granulocytes: 0.05 10*3/uL (ref 0.00–0.07)
Basophils Absolute: 0.1 10*3/uL (ref 0.0–0.1)
Basophils Relative: 1 %
Eosinophils Absolute: 0.1 10*3/uL (ref 0.0–0.5)
Eosinophils Relative: 1 %
HCT: 29.9 % — ABNORMAL LOW (ref 39.0–52.0)
Hemoglobin: 9.9 g/dL — ABNORMAL LOW (ref 13.0–17.0)
Immature Granulocytes: 1 %
Lymphocytes Relative: 17 %
Lymphs Abs: 1.8 10*3/uL (ref 0.7–4.0)
MCH: 30.9 pg (ref 26.0–34.0)
MCHC: 33.1 g/dL (ref 30.0–36.0)
MCV: 93.4 fL (ref 80.0–100.0)
Monocytes Absolute: 0.8 10*3/uL (ref 0.1–1.0)
Monocytes Relative: 7 %
Neutro Abs: 8.1 10*3/uL — ABNORMAL HIGH (ref 1.7–7.7)
Neutrophils Relative %: 73 %
Platelets: 248 10*3/uL (ref 150–400)
RBC: 3.2 MIL/uL — ABNORMAL LOW (ref 4.22–5.81)
RDW: 15.4 % (ref 11.5–15.5)
WBC: 10.9 10*3/uL — ABNORMAL HIGH (ref 4.0–10.5)
nRBC: 0 % (ref 0.0–0.2)

## 2020-10-19 LAB — COMPREHENSIVE METABOLIC PANEL
ALT: 17 U/L (ref 0–44)
AST: 8 U/L — ABNORMAL LOW (ref 15–41)
Albumin: 2.9 g/dL — ABNORMAL LOW (ref 3.5–5.0)
Alkaline Phosphatase: 62 U/L (ref 38–126)
Anion gap: 8 (ref 5–15)
BUN: 9 mg/dL (ref 8–23)
CO2: 20 mmol/L — ABNORMAL LOW (ref 22–32)
Calcium: 8.4 mg/dL — ABNORMAL LOW (ref 8.9–10.3)
Chloride: 106 mmol/L (ref 98–111)
Creatinine, Ser: 0.71 mg/dL (ref 0.61–1.24)
GFR, Estimated: >60 mL/min (ref >60)
Glucose, Bld: 87 mg/dL (ref 70–99)
Potassium: 3.7 mmol/L (ref 3.5–5.1)
Sodium: 134 mmol/L — ABNORMAL LOW (ref 135–145)
Total Bilirubin: 0.5 mg/dL (ref 0.3–1.2)
Total Protein: 5.5 g/dL — ABNORMAL LOW (ref 6.5–8.1)

## 2020-10-19 LAB — PHOSPHORUS: Phosphorus: 4.5 mg/dL (ref 2.5–4.6)

## 2020-10-19 LAB — IRON AND TIBC
Iron: 46 ug/dL (ref 45–182)
Saturation Ratios: 32 % (ref 17.9–39.5)
TIBC: 145 ug/dL — ABNORMAL LOW (ref 250–450)
UIBC: 99 ug/dL

## 2020-10-19 LAB — RETICULOCYTES
Immature Retic Fract: 20.1 % — ABNORMAL HIGH (ref 2.3–15.9)
RBC.: 3.23 MIL/uL — ABNORMAL LOW (ref 4.22–5.81)
Retic Count, Absolute: 46.8 10*3/uL (ref 19.0–186.0)
Retic Ct Pct: 1.5 % (ref 0.4–3.1)

## 2020-10-19 LAB — VITAMIN B12: Vitamin B-12: 1480 pg/mL — ABNORMAL HIGH (ref 180–914)

## 2020-10-19 LAB — MAGNESIUM: Magnesium: 1.9 mg/dL (ref 1.7–2.4)

## 2020-10-19 LAB — FOLATE: Folate: 13.3 ng/mL (ref >5.9)

## 2020-10-19 LAB — FERRITIN: Ferritin: 204 ng/mL (ref 24–336)

## 2020-10-19 MED ORDER — MAGNESIUM SULFATE IN D5W 1-5 GM/100ML-% IV SOLN
1.0000 g | Freq: Once | INTRAVENOUS | Status: AC
  Administered 2020-10-19: 1 g via INTRAVENOUS
  Filled 2020-10-19: qty 100

## 2020-10-19 MED ORDER — POTASSIUM CHLORIDE CRYS ER 20 MEQ PO TBCR
40.0000 meq | EXTENDED_RELEASE_TABLET | Freq: Two times a day (BID) | ORAL | Status: AC
  Administered 2020-10-19 (×2): 40 meq via ORAL
  Filled 2020-10-19 (×2): qty 2

## 2020-10-19 MED ORDER — LACTATED RINGERS IV SOLN: INTRAVENOUS | Status: AC

## 2020-10-19 MED ORDER — FLEET ENEMA 7-19 GM/118ML RE ENEM
1.0000 | ENEMA | Freq: Once | RECTAL | Status: AC
  Administered 2020-10-19: 1 via RECTAL
  Filled 2020-10-19: qty 1

## 2020-10-19 NOTE — Progress Notes (Signed)
PROGRESS NOTE    Eduardo Moon  ZOX:[id number] DOB: 1946/02/06 DOA: 10/17/2020 PCP: Timoteo Gaul, FNP  Brief Narrative:  The patient is a 75 year old Caucasian male with a past medical history significant for hypertension, arthritis, history of prostate cancer status post radioactive seeds, history of gouty arthritis and bedbound status and functional paraplegia who presented with a 2-1/2 history week of continuous diarrhea.  He states that he was treated for a UTI a month ago.  He is unsure why the diarrhea started but he has been having 4-6 loose bowel movement a day did not have mucus or blood in him.  Denies any black tarry stools and had minimal transient relief with Imodium and Pepto-Bismol.  He does have some abdominal pain and states this feels like it is cramping intermittently.  He also reported that he is not sleeping well the last few nights due to his diarrhea and abdominal pain and states that he has been able to drink and eat normally but usually well quick passage of diarrhea after eating.  He is brought to the ED for abdominal pain and diarrhea and work-up and ended up having a CT scan which did show some distal proctitis with a large stool burden.  Gastroenterology was consulted and his electrolytes are replete given that they are low on admission.  GI evaluated and did a partial digital disimpaction and ordered a mineral oil retention enema as well as increased his laxatives.  Antibiotics have been stopped as GI feels that he has no infectious etiology for his diarrhea. PT recommending CIR but the rehab physician feels that once the patient's medical issues are resolved he will not meet the medical need for inpatient hospital admission and recommending that other rehab options need to be pursued.  Assessment & Plan:   Principal Problem:   Hypokalemia Active Problems:   Proctitis   Hypomagnesemia   Essential hypertension   Diarrhea  Diarrhea secondary to overflow diarrhea in  the setting of fecal impaction -Patient was admitted as inpatient to telemetry and gastroenterology has been consulted -CT Abd/Pelvis showed "Large stool within the distal colon and particularly within the rectal vault now with mild rectal wall thickening and perirectal inflammatory stranding suggesting changes of stercoral proctitis. Marked wasting of the lower extremity musculature, left greater than right, unchanged.  Aortic Atherosclerosis."  -He had some low electrolytes with a potassium of 2.7 and a magnesium of 1.6 on Admission -Gastroenterology evaluated the patient and did a partial digital disimpaction and recommended a mineral oil retention enema to try and soften the remaining stool and Dr. Cristina Gong recommended increasing MiraLAX to twice a day dosing; -GI advised for the patient to have a repeat disimpaction later on after the mineral oil and per report from Nursing Staff was Successful; Will defer to GI to see if NuLytely is still needed -His enteric and contact precautions has been discontinued as the evidence of diarrhea is not secondary to an infectious process as the C. difficile was negative and because GI pathogen panel was negative -[name]'s Butt Cream ordered  -Await further Recc's per GI   Hypokalemia -Patient's potassium on admission was 2.7 and after repletion improved to 3.7 today  -Continue further repletion with p.o. potassium chloride 40 mEq twice daily x2 doses  -Continue to monitor and replete as necessary -Patient also has IV fluid hydration with lactated Ringer's at 125 mL per hour but will reduce to 75 mL/hr x1 Day -Magnesium are low at 1.9 so will replete with IV  mag sulfate 1 g -Repeat CMP in a.m.  Hypomagnesemia -The patient's magnesium level is 1.9 this AM -Continue to further replete with IV mag sulfate 1 g -Continue to monitor and replete as necessary -Repeat magnesium level in a.m.  Hypophosphatemia -Patient's phosphorus level was 2.3 and improved  to 4.5 -Replete with p.o. K-Phos Neutral 500 mg p.o. twice daily x2 doses yesterday -Continue to monitor and replete as necessary -Repeat CMP in a.m.  Proctitis, ruled out -Pt given dose of Zosyn in ER for antibiotic coverage.  -GI consulted and will see pt in am and will discontinue antibiotics now given that this is less likely infectious and likely in setting of his massive fecal impaction retention -Patient is afebrile and has no leukocytosis and WBC is 10.3  Essential Hypertension -Continue with home medications of Metoprolol Succinate 100 mg po daily, Amlodipine 5 mg po Daily and Lisinopril 30 mg po Daily -Continue to Monitor BP per Protocol -Last BP was 136/72  Asymptomatic Bacteuria/Pyuria -His urinalysis showed a cloudy appearance with yellow color urine, small leukocytes, negative nitrites, many bacteria, 0-5 non-squamous epithelial cells, 6-10 RBCs per high-power field, 0-5 squamous epithelial cells and greater than 50 WBCs -Urine culture not done but patient is asymptomatic and not complaining of burning or discomfort in his urine -If he develops any of the symptoms or becomes febrile will need to treat  Functional Paraplegia Hx of Polio -Has mainly been bedbound and not ambulated since October -PT/OT to evaluate and Treat and recommending CIR initially but per CIR not a candidate so will need to look for SNF -Will need PT/OT to re-evaluate and Treat with Patient's Leg Prace  Hyponatremia -Mild and improving  -Patient is sodium went from 133 is now 132 -> 134 -Continue with LR as above but may need to change normal saline -Continue to monitor and trend and repeat CMP in a.m.  Metabolic Acidosis -Mild, but improving  -Patient's CO2 Today is 20, anion gap is 8, chloride level is 106 and likely in the setting of his diarrhea -Continue to monitor and trend and repeat CMP in a.m.  Gouty Arthritis -C/w Allopurinol 300 mg po BID  HLD -C/w Rosuvastatin 5 mg po  qHS  Leukocytosis -Mild and likely reactive as patient WBC went from 10.4 -> 10.3 -> 10.9 -Currently he is Afebrile -Continue to monitor S/Sx of Infection -Repeat CBC in the AM   Normocytic Anemia -Patient's Hgb/Hct went from 11.1/33.3 -> 9.9/30.2 -> 9.9/29.9 -[place] Anemia Panel and showed an iron level of 46, U IBC of 99, TIBC of 145, saturation ratios of 32%, ferritin level 204, folate of 13.3, vitamin B12 of 1480 -Continue to Monitor for S/Sx of Bleeding; Currently no overt bleeding noted -Repeat CBC in the AM   DVT prophylaxis: Enoxaparin 40 mg sq q24h Code Status: FULL CODE  Family Communication: No family present  Disposition Plan: Pending further clinical improvement and evaluation by PT/OT   Status is: Inpatient  Remains inpatient appropriate because:Unsafe d/c plan, IV treatments appropriate due to intensity of illness or inability to take PO and Inpatient level of care appropriate due to severity of illness   Dispo: The patient is from: Home              Anticipated d/c is to: TBD              Patient currently is not medically stable to d/c.   Difficult to place patient No  Consultants:   Gastroenterology   Procedures: None  Antimicrobials:  Anti-infectives (From admission, onward)   Start     Dose/Rate Route Frequency Ordered Stop   10/18/20 0400  piperacillin-tazobactam (ZOSYN) IVPB 3.375 g  Status:  Discontinued        3.375 g 12.5 mL/hr over 240 Minutes Intravenous Every 8 hours 10/17/20 2313 10/18/20 1948   10/17/20 2015  piperacillin-tazobactam (ZOSYN) IVPB 3.375 g        3.375 g 100 mL/hr over 30 Minutes Intravenous  Once 10/17/20 2005 10/17/20 2124        Subjective: Seen and examined at bedside states he was feeling much better today and was happy that he had been disimpacted last night with a large stool burden coming out.  No chest pain, lightheadedness or dizziness.  His abdomen is soft and is passing gas.  Thinks his diarrhea has resolved.   No other concerns or complaints at this time and was hopeful to go to CIR however they have turned him down so we will need to pursue other options and will need PT reevaluation with his leg brace given his history of polio.  Objective: Vitals:   10/18/20 0911 10/18/20 1421 10/18/20 2132 10/19/20 0607  BP: 135/74 136/72 134/73 131/74  Pulse: 76 78 74 75  Resp: 20 18 18 18   Temp: 98.7 F (37.1 C) 98.3 F (36.8 C) 98.4 F (36.9 C)   TempSrc: Oral Oral Oral   SpO2: 99% 99% 100% 100%  Weight:      Height:        Intake/Output Summary (Last 24 hours) at 10/19/2020 2353 Last data filed at 10/19/2020 6144 Gross per 24 hour  Intake 2338.36 ml  Output 5651 ml  Net -3312.64 ml   Filed Weights   10/17/20 1345  Weight: 68 kg   Examination: Physical Exam:  Constitutional: WN/WD Caucasian male in NAD and appears calm and comfortable Eyes: Lids and conjunctivae normal, sclerae anicteric  ENMT: External Ears, Nose appear normal. Grossly normal hearing.  Neck: Appears normal, supple, no cervical masses, normal ROM, no appreciable thyromegaly; no JVD Respiratory: Diminished to auscultation bilaterally, no wheezing, rales, rhonchi or crackles. Normal respiratory effort and patient is not tachypenic. No accessory muscle use.  Unlabored breathing Cardiovascular: RRR, no murmurs / rubs / gallops. S1 and S2 auscultated.  Left leg has some atrophy and is weaker compared to right with some mild extremity edema Abdomen: Soft, not tender, distended secondary body habitus.  Bowel sounds positive x4.  GU: Deferred. Musculoskeletal: No clubbing / cyanosis of digits/nails.  Left leg has some atrophy and is weaker compared to the right given his history of polio Skin: No rashes, lesions, ulcers on limited skin evaluation. No induration; Warm and dry.  Neurologic: CN 2-12 grossly intact with no focal deficits. Romberg sign and cerebellar reflexes not assessed.  Psychiatric: Normal judgment and insight. Alert  and oriented x 3. Normal mood and appropriate affect.   Data Reviewed: I have personally reviewed following labs and imaging studies  CBC: Recent Labs  Lab 10/17/20 1400 10/18/20 0526 10/19/20 0523  WBC 10.4 10.3 10.9*  NEUTROABS 8.2*  --  8.1*  HGB 11.1* 9.9* 9.9*  HCT 33.3* 30.2* 29.9*  MCV 93.3 94.4 93.4  PLT 290 258 315   Basic Metabolic Panel: Recent Labs  Lab 10/17/20 1400 10/18/20 0526 10/18/20 0803 10/19/20 0523  NA 133* 132*  --  134*  K 2.7* 3.6  --  3.7  CL 104 106  --  106  CO2 18* 18*  --  20*  GLUCOSE 167* 97  --  87  BUN 12 8  --  9  CREATININE 0.81 0.68  --  0.71  CALCIUM 8.7* 8.3*  --  8.4*  MG 1.6*  --  1.7 1.9  PHOS  --   --  2.3* 4.5   GFR: Estimated Creatinine Clearance: 70.9 mL/min (by C-G formula based on SCr of 0.71 mg/dL). Liver Function Tests: Recent Labs  Lab 10/17/20 1400 10/18/20 0803 10/19/20 0523  AST 14* 8* 8*  ALT 20 19 17   ALKPHOS 75 64 62  BILITOT 0.5 0.4 0.5  PROT 6.6 5.5* 5.5*  ALBUMIN 3.4* 2.9* 2.9*   Recent Labs  Lab 10/17/20 1400  LIPASE 26   No results for input(s): AMMONIA in the last 168 hours. Coagulation Profile: No results for input(s): INR, PROTIME in the last 168 hours. Cardiac Enzymes: No results for input(s): CKTOTAL, CKMB, CKMBINDEX, TROPONINI in the last 168 hours. BNP (last 3 results) No results for input(s): PROBNP in the last 8760 hours. HbA1C: No results for input(s): HGBA1C in the last 72 hours. CBG: No results for input(s): GLUCAP in the last 168 hours. Lipid Profile: No results for input(s): CHOL, HDL, LDLCALC, TRIG, CHOLHDL, LDLDIRECT in the last 72 hours. Thyroid Function Tests: No results for input(s): TSH, T4TOTAL, FREET4, T3FREE, THYROIDAB in the last 72 hours. Anemia Panel: No results for input(s): VITAMINB12, FOLATE, FERRITIN, TIBC, IRON, RETICCTPCT in the last 72 hours. Sepsis Labs: No results for input(s): PROCALCITON, LATICACIDVEN in the last 168 hours.  Recent Results (from  the past 240 hour(s))  C Difficile Quick Screen w PCR reflex     Status: None   Collection Time: 10/17/20  3:41 PM   Specimen: STOOL  Result Value Ref Range Status   C Diff antigen NEGATIVE NEGATIVE Final   C Diff toxin NEGATIVE NEGATIVE Final   C Diff interpretation No C. difficile detected.  Final    Comment: Performed at [facility], [place] [address], [place], [place] [address]  Gastrointestinal Panel by PCR , Stool     Status: None   Collection Time: 10/17/20  3:41 PM   Specimen: Stool  Result Value Ref Range Status   Campylobacter species NOT DETECTED NOT DETECTED Final   Plesimonas shigelloides NOT DETECTED NOT DETECTED Final   Salmonella species NOT DETECTED NOT DETECTED Final   Yersinia enterocolitica NOT DETECTED NOT DETECTED Final   Vibrio species NOT DETECTED NOT DETECTED Final   Vibrio cholerae NOT DETECTED NOT DETECTED Final   Enteroaggregative E coli (EAEC) NOT DETECTED NOT DETECTED Final   Enteropathogenic E coli (EPEC) NOT DETECTED NOT DETECTED Final   Enterotoxigenic E coli (ETEC) NOT DETECTED NOT DETECTED Final   Shiga like toxin producing E coli (STEC) NOT DETECTED NOT DETECTED Final   Shigella/Enteroinvasive E coli (EIEC) NOT DETECTED NOT DETECTED Final   Cryptosporidium NOT DETECTED NOT DETECTED Final   Cyclospora cayetanensis NOT DETECTED NOT DETECTED Final   Entamoeba histolytica NOT DETECTED NOT DETECTED Final   Giardia lamblia NOT DETECTED NOT DETECTED Final   Adenovirus F40/41 NOT DETECTED NOT DETECTED Final   Astrovirus NOT DETECTED NOT DETECTED Final   Norovirus GI/GII NOT DETECTED NOT DETECTED Final   Rotavirus A NOT DETECTED NOT DETECTED Final   Sapovirus (I, II, IV, and V) NOT DETECTED NOT DETECTED Final    Comment: Performed at [facility], [address] [place], [place] [address]  SARS CORONAVIRUS 2 (TAT 6-24 HRS) Nasopharyngeal Nasopharyngeal Swab  Status: None   Collection Time: 10/17/20  8:20 PM   Specimen:  Nasopharyngeal Swab  Result Value Ref Range Status   SARS Coronavirus 2 NEGATIVE NEGATIVE Final    Comment: (NOTE) SARS-CoV-2 target nucleic acids are NOT DETECTED.  The SARS-CoV-2 RNA is generally detectable in upper and lower respiratory specimens during the acute phase of infection. Negative results do not preclude SARS-CoV-2 infection, do not rule out co-infections with other pathogens, and should not be used as the sole basis for treatment or other patient management decisions. Negative results must be combined with clinical observations, patient history, and epidemiological information. The expected result is Negative.  Fact Sheet for Patients: SugarRoll.be  Fact Sheet for Healthcare Providers: https://www.woods-mathews.com/  This test is not yet approved or cleared by the [place] FDA and  has been authorized for detection and/or diagnosis of SARS-CoV-2 by FDA under an Emergency Use Authorization (EUA). This EUA will remain  in effect (meaning this test can be used) for the duration of the COVID-19 declaration under Se ction 564(b)(1) of the Act, 21 U.S.C. section 360bbb-3(b)(1), unless the authorization is terminated or revoked sooner.  Performed at [facility], [address], [place], [place] [address]    RN Pressure Injury Documentation:     Estimated body mass index is 24.21 kg/m as calculated from the following:   Height as of this encounter: 5\' 6"  (1.676 m).   Weight as of this encounter: 68 kg.  Malnutrition Type:    Malnutrition Characteristics:   Nutrition Interventions:    Radiology Studies: CT ABDOMEN PELVIS W CONTRAST  Result Date: 10/17/2020 CLINICAL DATA:  Diverticulitis, diarrhea, unspecified abdominal pain EXAM: CT ABDOMEN AND PELVIS WITH CONTRAST TECHNIQUE: Multidetector CT imaging of the abdomen and pelvis was performed using the standard protocol following bolus administration of  intravenous contrast. CONTRAST:  160mL OMNIPAQUE IOHEXOL 300 MG/ML  SOLN COMPARISON:  06/25/2020 FINDINGS: Lower chest: The visualized lung bases are clear bilaterally. The visualized heart and pericardium are unremarkable. Hepatobiliary: No focal liver abnormality is seen. No gallstones, gallbladder wall thickening, or biliary dilatation. Pancreas: Unremarkable Spleen: Unremarkable Adrenals/Urinary Tract: The adrenal glands are unremarkable. 2.8 cm simple cortical cyst noted within the upper pole the left kidney. The kidneys are otherwise unremarkable. The bladder is unremarkable. Stomach/Bowel: Large stool within the colon and particularly within the rectal vault with mild perirectal inflammatory stranding and rectal wall thickening possibly reflecting changes of stercoral proctitis. The stomach, small bowel, and large bowel are otherwise unremarkable. Appendix is unremarkable. No free intraperitoneal gas or fluid. Vascular/Lymphatic: Moderate atherosclerotic calcification within the abdominal aorta. No aortic aneurysm. No pathologic adenopathy within the abdomen and pelvis. Reproductive: Brachytherapy seeds are seen within the prostate gland, unchanged. Seminal vesicles are unremarkable. Other: Tiny fat containing umbilical hernia. Musculoskeletal: There is marked wasting of the left ileo psoas and gluteal musculature. There is wasting of the visualized proximal thigh musculature bilaterally, left greater than right. No acute bone abnormality. IMPRESSION: Large stool within the distal colon and particularly within the rectal vault now with mild rectal wall thickening and perirectal inflammatory stranding suggesting changes of stercoral proctitis. Marked wasting of the lower extremity musculature, left greater than right, unchanged. Aortic Atherosclerosis (ICD10-I70.0). Electronically Signed   By: Fidela Salisbury MD   On: 10/17/2020 16:52   Scheduled Meds: . allopurinol  300 mg Oral BID  . amLODipine  5 mg Oral  Daily  . enoxaparin (LOVENOX) injection  40 mg Subcutaneous Q24H  . lisinopril  30  mg Oral Daily  . metoprolol succinate  100 mg Oral Daily  . polyethylene glycol  17 g Oral BID  . potassium chloride  40 mEq Oral BID  . rosuvastatin  5 mg Oral QPM   Continuous Infusions: . lactated ringers 125 mL/hr at 10/19/20 0557  . magnesium sulfate bolus IVPB      LOS: 2 days   Kerney Elbe, DO [facility] PAGER is on [place]  If 7PM-7AM, please contact night-coverage [email or web address]

## 2020-10-19 NOTE — Progress Notes (Signed)
OT Cancellation Note  Patient Details Name: Eduardo Moon MRN: [id number] DOB: 07-21-1946   Cancelled Treatment:    Reason Eval/Treat Not Completed: Other (comment). Patient wanting to wait to attempt evaluation until he had his brace. Went by room twice to see if brace had arrived and had not. Will f/u as able.  Lori Popowski L Talissa Apple 10/19/2020, 2:50 PM

## 2020-10-19 NOTE — Progress Notes (Signed)
PT Cancellation Note  Patient Details Name: Eduardo Moon MRN: [id number] DOB: 06-07-1946   Cancelled Treatment:    Reason Eval/Treat Not Completed: Patient declined, no reason specified Recommended during evaluation yesterday for pt call family to bring L KAFO to hospital for therapy sessions.  Pt prefers to mobilize with his L KAFO which has not yet arrived.  Upon writing this, overheard pt call family and request brace.   Maicee Ullman,KATHrine E 10/19/2020, 2:26 PM Kati PT, DPT Acute Rehabilitation Services Pager: [phone] Office: [phone]

## 2020-10-19 NOTE — Progress Notes (Signed)
Pt alert and aware in bed. Pt states he has had a tough time. He explained what has been going on with him. He asked for prayer. The chaplain offered caring and supportive presence, prayers and blessings.

## 2020-10-19 NOTE — Progress Notes (Addendum)
Inpatient Rehabilitation Admissions Coordinator   Discussed case with Dr. Naaman Plummer. Feel once acute medical issues resolved, he will not meet the medical need for an inpt hospital rehab admission. Other rehab venue options will need to be pursued .  Danne Baxter, RN, MSN Rehab Admissions Coordinator [phone] 10/19/2020 9:57 AM   I spoke with his daughter, [name], by phone to discuss why patient  not a candidate for CIR admit at this time.  Danne Baxter, RN, MSN Rehab Admissions Coordinator [phone] 10/19/2020 10:43 AM

## 2020-10-19 NOTE — Progress Notes (Signed)
Patient states he is passing stool, and slept better last night after yesterday's disimpaction.  On exam today, he looks comfortable.  Digital exam showed a large amount of formed stool, which had migrated into the distal rectum within reach of the examining finger, not quite as firm as yesterday (following mineral oil retention enema), so a repeat digital disimpaction was accomplished, delivering roughly the same volume of stool as yesterday, roughly the size of an orange in aggregate amount.  At the conclusion, the rectal ampulla was free of any formed stool.  Minimal if any blood was present.  Impression: Clinically resolved fecal impaction  Plan:  1.  Fleet enema x1, this evening  2.  Continue MiraLAX  The patient's daughter was at the bedside and I was able to explain the reasons why I think the patient developed a fecal impaction.  Cleotis Nipper, M.D. Pager [phone] If no answer or after 5 PM call [phone]

## 2020-10-19 NOTE — Plan of Care (Signed)
  Problem: Education: Goal: Knowledge of General Education information will improve Description: Including pain rating scale, medication(s)/side effects and non-pharmacologic comfort measures Outcome: Progressing   Problem: Health Behavior/Discharge Planning: Goal: Ability to manage health-related needs will improve Outcome: Progressing   Problem: Activity: Goal: Risk for activity intolerance will decrease Outcome: Progressing   Problem: Clinical Measurements: Goal: Ability to maintain clinical measurements within normal limits will improve Outcome: Progressing Goal: Will remain free from infection Outcome: Progressing Goal: Diagnostic test results will improve Outcome: Progressing Goal: Respiratory complications will improve Outcome: Progressing Goal: Cardiovascular complication will be avoided Outcome: Progressing   Problem: Nutrition: Goal: Adequate nutrition will be maintained Outcome: Progressing   Problem: Coping: Goal: Level of anxiety will decrease Outcome: Progressing   Problem: Pain Managment: Goal: General experience of comfort will improve Outcome: Progressing

## 2020-10-19 NOTE — Care Management Important Message (Signed)
Important Message  Patient Details IM Letter given to the Patient. Name: Eduardo Moon MRN: [id number] Date of Birth: Dec 24, 1945   Medicare Important Message Given:  Yes     Kerin Salen 10/19/2020, 11:33 AM

## 2020-10-19 NOTE — TOC Progression Note (Addendum)
Transition of Care [facility]) - Progression Note    Patient Details  Name: Eduardo Moon MRN: [id number] Date of Birth: 29-Sep-1945  [facility]) CM/SW Contact  Mahabir, Juliann Pulse, RN Phone Number: 10/19/2020, 10:40 AM  Clinical Narrative: Noted currently CIR-not appropriate-spoke to dtr Melissa-explained to consider other options since he used [facility] in past-tr Melissa had many questions about why he doesn't qualify for CIR-provided Pamala Hurry rep for [place] contact tel# [phone] to further explain.   11:23a-confirmed [facility]-rep Tommi Rumps following. Will await PT re eval. 12:56p-PTAR forms in printer for nsg to put in packet-CM will call PTAR once PT makes recc, & HHC orders if rec,& d/c order in place.   Expected Discharge Plan: [place] Barriers to Discharge: Continued Medical Work up  Expected Discharge Plan and Services Expected Discharge Plan: [place]   Discharge Planning Services: CM Consult   Living arrangements for the past 2 months: Single Family Home                                       Social Determinants of Health (SDOH) Interventions    Readmission Risk Interventions Readmission Risk Prevention Plan 06/26/2020 06/13/2020  Post Dischage Appt - Complete  Medication Screening - Complete  Transportation Screening Complete Complete  PCP or Specialist Appt within 3-5 Days Complete -  HRI or Home Care Consult Complete -  Social Work Consult for [place] Planning/Counseling Complete -  Palliative Care Screening Not Applicable -  Medication Review Press photographer) Complete -  Some recent data might be hidden

## 2020-10-20 ENCOUNTER — Inpatient Hospital Stay (HOSPITAL_COMMUNITY): Payer: Medicare Other

## 2020-10-20 LAB — COMPREHENSIVE METABOLIC PANEL
ALT: 17 U/L (ref 0–44)
AST: 9 U/L — ABNORMAL LOW (ref 15–41)
Albumin: 2.8 g/dL — ABNORMAL LOW (ref 3.5–5.0)
Alkaline Phosphatase: 57 U/L (ref 38–126)
Anion gap: 7 (ref 5–15)
BUN: 11 mg/dL (ref 8–23)
CO2: 21 mmol/L — ABNORMAL LOW (ref 22–32)
Calcium: 8.4 mg/dL — ABNORMAL LOW (ref 8.9–10.3)
Chloride: 106 mmol/L (ref 98–111)
Creatinine, Ser: 0.76 mg/dL (ref 0.61–1.24)
GFR, Estimated: >60 mL/min (ref >60)
Glucose, Bld: 89 mg/dL (ref 70–99)
Potassium: 4.1 mmol/L (ref 3.5–5.1)
Sodium: 134 mmol/L — ABNORMAL LOW (ref 135–145)
Total Bilirubin: 0.4 mg/dL (ref 0.3–1.2)
Total Protein: 5.2 g/dL — ABNORMAL LOW (ref 6.5–8.1)

## 2020-10-20 LAB — CBC WITH DIFFERENTIAL/PLATELET
Abs Immature Granulocytes: 0.1 10*3/uL — ABNORMAL HIGH (ref 0.00–0.07)
Basophils Absolute: 0.1 10*3/uL (ref 0.0–0.1)
Basophils Relative: 1 %
Eosinophils Absolute: 0.2 10*3/uL (ref 0.0–0.5)
Eosinophils Relative: 3 %
HCT: 29.2 % — ABNORMAL LOW (ref 39.0–52.0)
Hemoglobin: 9.5 g/dL — ABNORMAL LOW (ref 13.0–17.0)
Immature Granulocytes: 1 %
Lymphocytes Relative: 26 %
Lymphs Abs: 1.9 10*3/uL (ref 0.7–4.0)
MCH: 30.9 pg (ref 26.0–34.0)
MCHC: 32.5 g/dL (ref 30.0–36.0)
MCV: 95.1 fL (ref 80.0–100.0)
Monocytes Absolute: 0.7 10*3/uL (ref 0.1–1.0)
Monocytes Relative: 9 %
Neutro Abs: 4.5 10*3/uL (ref 1.7–7.7)
Neutrophils Relative %: 60 %
Platelets: 248 10*3/uL (ref 150–400)
RBC: 3.07 MIL/uL — ABNORMAL LOW (ref 4.22–5.81)
RDW: 15.7 % — ABNORMAL HIGH (ref 11.5–15.5)
WBC: 7.5 10*3/uL (ref 4.0–10.5)
nRBC: 0 % (ref 0.0–0.2)

## 2020-10-20 LAB — PHOSPHORUS: Phosphorus: 3.1 mg/dL (ref 2.5–4.6)

## 2020-10-20 LAB — MAGNESIUM: Magnesium: 1.8 mg/dL (ref 1.7–2.4)

## 2020-10-20 MED ORDER — POLYETHYLENE GLYCOL 3350 17 G PO PACK
17.0000 g | PACK | Freq: Two times a day (BID) | ORAL | Status: DC
  Administered 2020-10-20 – 2020-10-23 (×4): 17 g via ORAL
  Filled 2020-10-20 (×4): qty 1

## 2020-10-20 MED ORDER — MAGNESIUM SULFATE 2 GM/50ML IV SOLN
2.0000 g | Freq: Once | INTRAVENOUS | Status: AC
  Administered 2020-10-20: 2 g via INTRAVENOUS
  Filled 2020-10-20: qty 50

## 2020-10-20 NOTE — Progress Notes (Signed)
Patient denies abdominal pain.  He is feeling better following the cleanout.  He indicates he is having "good" bowel movements, on MiraLAX twice daily.  Apparently no frank diarrhea on that dose of MiraLAX.  Digital exam was performed today with the help of the nursing staff and tech, and shows no further fecal impaction, just some liquid light tan stool in the rectal ampulla.  There is an external hemorrhoid.  Impression: Resolved fecal impaction presenting as "overflow" diarrhea  Recommendation:  1.  Continue MiraLAX twice daily.  I have modified the order so that a dose can be held if the patient has had 2 bowel movements since his last dose.  Hopefully this will prevent overdosing.  2.  The patient was significantly hypokalemic at time of admission, which may have contributed to the constipation/fecal impaction issue.  This will need to be monitored as an outpatient and treated if necessary.  3.  I will sign off.  Please call if we can be of further assistance with this patient's care.  I do not think that specific GI follow-up is needed, but if it is, Dr. Clarene Essex is his primary gastroenterologist.  Cleotis Nipper, M.D. Pager [phone] If no answer or after 5 PM call [phone]

## 2020-10-20 NOTE — Progress Notes (Signed)
PT Cancellation Note  Patient Details Name: Eduardo Moon MRN: [id number] DOB: [birth year]   Cancelled Treatment:      Pt with c/o L wrist pain and with xrays ordered.  Pt with heavy reliance on UE for mobility - will await results of xrays to advance mobility. Abran Richard, PT Acute Rehab Services Pager [phone] [facility] [place] 10/20/2020, 2:20 PM

## 2020-10-20 NOTE — Plan of Care (Signed)

## 2020-10-20 NOTE — Plan of Care (Signed)
  Problem: Education: Goal: Knowledge of General Education information will improve Description: Including pain rating scale, medication(s)/side effects and non-pharmacologic comfort measures 10/20/2020 0746 by Lonia Skinner, RN Outcome: Progressing 10/19/2020 1924 by Lonia Skinner, RN Outcome: Progressing   Problem: Health Behavior/Discharge Planning: Goal: Ability to manage health-related needs will improve 10/20/2020 0746 by Lonia Skinner, RN Outcome: Progressing 10/19/2020 1924 by Lonia Skinner, RN Outcome: Progressing   Problem: Clinical Measurements: Goal: Ability to maintain clinical measurements within normal limits will improve 10/20/2020 0746 by Lonia Skinner, RN Outcome: Progressing 10/19/2020 1924 by Lonia Skinner, RN Outcome: Progressing Goal: Will remain free from infection 10/20/2020 0746 by Lonia Skinner, RN Outcome: Progressing 10/19/2020 1924 by Lonia Skinner, RN Outcome: Progressing Goal: Diagnostic test results will improve 10/20/2020 0746 by Lonia Skinner, RN Outcome: Progressing 10/19/2020 1924 by Lonia Skinner, RN Outcome: Progressing Goal: Respiratory complications will improve 10/20/2020 0746 by Lonia Skinner, RN Outcome: Progressing 10/19/2020 1924 by Lonia Skinner, RN Outcome: Progressing Goal: Cardiovascular complication will be avoided 10/20/2020 0746 by Lonia Skinner, RN Outcome: Progressing 10/19/2020 1924 by Lonia Skinner, RN Outcome: Progressing   Problem: Safety: Goal: Ability to remain free from injury will improve 10/20/2020 0746 by Lonia Skinner, RN Outcome: Progressing 10/19/2020 1924 by Lonia Skinner, RN Outcome: Progressing   Problem: Coping: Goal: Level of anxiety will decrease 10/20/2020 0746 by Lonia Skinner, RN Outcome: Progressing 10/19/2020 1924 by Lonia Skinner, RN Outcome: Progressing   Problem: Nutrition: Goal: Adequate nutrition will be maintained 10/20/2020 0746 by Lonia Skinner, RN Outcome: Progressing 10/19/2020 1924 by  Lonia Skinner, RN Outcome: Progressing   Problem: Activity: Goal: Risk for activity intolerance will decrease 10/20/2020 0746 by Lonia Skinner, RN Outcome: Progressing 10/19/2020 1924 by Lonia Skinner, RN Outcome: Progressing   Problem: Elimination: Goal: Will not experience complications related to bowel motility 10/20/2020 0746 by Lonia Skinner, RN Outcome: Progressing 10/19/2020 1924 by Lonia Skinner, RN Outcome: Progressing   Problem: Pain Managment: Goal: General experience of comfort will improve 10/20/2020 0746 by Lonia Skinner, RN Outcome: Progressing 10/19/2020 1924 by Lonia Skinner, RN Outcome: Progressing
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
## 2020-10-21 LAB — COMPREHENSIVE METABOLIC PANEL
ALT: 20 U/L (ref 0–44)
AST: 10 U/L — ABNORMAL LOW (ref 15–41)
Albumin: 3.1 g/dL — ABNORMAL LOW (ref 3.5–5.0)
Alkaline Phosphatase: 66 U/L (ref 38–126)
Anion gap: 9 (ref 5–15)
BUN: 10 mg/dL (ref 8–23)
CO2: 21 mmol/L — ABNORMAL LOW (ref 22–32)
Calcium: 8.9 mg/dL (ref 8.9–10.3)
Chloride: 103 mmol/L (ref 98–111)
Creatinine, Ser: 0.94 mg/dL (ref 0.61–1.24)
GFR, Estimated: >60 mL/min (ref >60)
Glucose, Bld: 91 mg/dL (ref 70–99)
Potassium: 3.6 mmol/L (ref 3.5–5.1)
Sodium: 133 mmol/L — ABNORMAL LOW (ref 135–145)
Total Bilirubin: 0.4 mg/dL (ref 0.3–1.2)
Total Protein: 5.9 g/dL — ABNORMAL LOW (ref 6.5–8.1)

## 2020-10-21 LAB — CBC WITH DIFFERENTIAL/PLATELET
Abs Immature Granulocytes: 0.08 10*3/uL — ABNORMAL HIGH (ref 0.00–0.07)
Basophils Absolute: 0.1 10*3/uL (ref 0.0–0.1)
Basophils Relative: 1 %
Eosinophils Absolute: 0.3 10*3/uL (ref 0.0–0.5)
Eosinophils Relative: 3 %
HCT: 32.3 % — ABNORMAL LOW (ref 39.0–52.0)
Hemoglobin: 10.8 g/dL — ABNORMAL LOW (ref 13.0–17.0)
Immature Granulocytes: 1 %
Lymphocytes Relative: 24 %
Lymphs Abs: 2.2 10*3/uL (ref 0.7–4.0)
MCH: 30.8 pg (ref 26.0–34.0)
MCHC: 33.4 g/dL (ref 30.0–36.0)
MCV: 92 fL (ref 80.0–100.0)
Monocytes Absolute: 0.7 10*3/uL (ref 0.1–1.0)
Monocytes Relative: 8 %
Neutro Abs: 5.7 10*3/uL (ref 1.7–7.7)
Neutrophils Relative %: 63 %
Platelets: 283 10*3/uL (ref 150–400)
RBC: 3.51 MIL/uL — ABNORMAL LOW (ref 4.22–5.81)
RDW: 15.7 % — ABNORMAL HIGH (ref 11.5–15.5)
WBC: 9 10*3/uL (ref 4.0–10.5)
nRBC: 0 % (ref 0.0–0.2)

## 2020-10-21 LAB — MAGNESIUM: Magnesium: 2.1 mg/dL (ref 1.7–2.4)

## 2020-10-21 LAB — PHOSPHORUS: Phosphorus: 3.6 mg/dL (ref 2.5–4.6)

## 2020-10-21 MED ORDER — PANTOPRAZOLE SODIUM 40 MG PO TBEC
40.0000 mg | DELAYED_RELEASE_TABLET | Freq: Every day | ORAL | Status: DC
  Administered 2020-10-21 – 2020-10-23 (×3): 40 mg via ORAL
  Filled 2020-10-21 (×3): qty 1

## 2020-10-21 MED ORDER — HYDROCODONE-ACETAMINOPHEN 7.5-325 MG PO TABS
1.0000 | ORAL_TABLET | Freq: Every day | ORAL | Status: DC | PRN
  Administered 2020-10-21 – 2020-10-23 (×3): 1 via ORAL
  Filled 2020-10-21 (×3): qty 1

## 2020-10-21 NOTE — Evaluation (Signed)
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 Patient found to have left torn scapholunate ligament - patient placed in wrist splint and WBAT.   Clinical Impression   Eduardo Moon is a 75 year old man with a history of childhood polio with left sided weakness, LLE with no active movement but use of a KAFO brace for support, generalized weakness, decreased activity tolerance, impaired balance and newly found torn scapholunate ligament tear limiting use of left upper extremity for transfers. Patient limited to setup assitance for UB ADLs either in sitting or bed level, total assist for toileting, and max assist for LB ADLs at bed level. Patient total assist for lateral scoot to recliner demonstrating significant difficulty of using upper extremities to offload buttocks in order to efficiently scoot. Limited by pain in left wrist as well despite brace. Patient will benefit from skilled OT services while in hospital to improve deficits and learn compensatory strategies as needed in order to improve functional abilities and reduce caregiver burden. Therapist recommends short term rehab prior to return home to allow for more aggressive therapy. However, patient appeared to not be agreeable to SNF. If he goes home recommend [facility] OT as well.      Follow Up Recommendations  Home health OT;SNF    Equipment Recommendations   (Drop Arm BSC)    Recommendations for Other Services       Precautions / Restrictions Precautions Precautions: Fall Precaution Comments: L KAFO; very weak Required Braces or  Orthoses: Splint/Cast Splint/Cast: left velcor wrist splint Restrictions Weight Bearing Restrictions: Yes LUE Weight Bearing: Weight bearing as tolerated      Mobility Bed Mobility Overal bed mobility: Needs Assistance Bed Mobility: Supine to Sit     Supine to sit: Mod assist     General bed mobility comments: Assist for trunk and bil LEs Increased time. Unsteady.    Transfers Overall transfer level: Needs assistance   Transfers: Lateral/Scoot Transfers          Lateral/Scoot Transfers: Total assist;+2 safety/equipment General transfer comment: Multimodal cueing provided for safety, technique. Pt had significant difficulty using UEs to unweight buttocks to allow for scooting-poor technique.    Balance Overall balance assessment: Needs assistance Sitting-balance support: No upper extremity supported;Feet supported Sitting balance-Leahy Scale: Fair Sitting balance - Comments: Poor dynamic balance                                   ADL either performed or assessed with clinical judgement   ADL Overall ADL's : Needs assistance/impaired Eating/Feeding: Independent   Grooming: Set up;Sitting;Bed level   Upper Body Bathing: Set up;Bed level;Sitting   Lower Body Bathing: Moderate assistance;Bed level;Sitting/lateral leans   Upper Body Dressing : Set up;Sitting;Bed level   Lower Body Dressing: Maximal assistance;Bed level     Toilet Transfer Details (indicate cue type and reason): unable Toileting- Clothing Manipulation and Hygiene: Bed level               Vision Patient Visual Report: No change  from baseline       Perception     Praxis      Pertinent Vitals/Pain Pain Assessment: Faces Faces Pain Scale: Hurts a little bit Pain Location: left wrist Pain Descriptors / Indicators: Grimacing Pain Intervention(s): Limited activity within patient's tolerance     Hand Dominance     Extremity/Trunk Assessment Upper Extremity  Assessment Upper Extremity Assessment: RUE deficits/detail;LUE deficits/detail (Overall strength testing good except for left shoulder but with functional activity - exhibits generalized weakness.) RUE Deficits / Details: 5/5 shoulder , 5/5 elbow, 4+/5 tricep, wrist 5/5, 4/5 grip RUE Sensation: WNL RUE Coordination: WNL LUE Deficits / Details: 3+/5 shoulder, 4/5 elbow, wrist not tested, functional grip strength LUE Sensation: WNL LUE Coordination: WNL   Lower Extremity Assessment Lower Extremity Assessment: Defer to PT evaluation   Cervical / Trunk Assessment Cervical / Trunk Assessment: Normal   Communication Communication Communication: No difficulties   Cognition Arousal/Alertness: Awake/alert Behavior During Therapy: WFL for tasks assessed/performed Overall Cognitive Status: Within Functional Limits for tasks assessed                                     General Comments       Exercises     Shoulder Instructions      Home Living Family/patient expects to be discharged to:: Private residence Living Arrangements: Children (daughter)   Type of Home: House Home Access: Stairs to enter Technical brewer of Steps: 3   Home Layout: One level               Home Equipment: Crutches;Wheelchair - [employer];Hospital bed   Additional Comments: does not drive, does not leave the house unless for MD appointments, likes old country western movies.        Prior Functioning/Environment Level of Independence: Needs assistance        Comments: from admission 3 months ago: "pt typically able to perform household ambulation with axillary crutches; able to bath/dress himself"  pt reports currently using "diaper" and staying in bed however has been working with HHPT the past month on strengthening and lateral/scoot transfers        OT Problem List: Decreased strength;Decreased range of motion;Decreased activity tolerance;Impaired balance  (sitting and/or standing);Decreased knowledge of use of DME or AE;Pain;Impaired UE functional use      OT Treatment/Interventions: Self-care/ADL training;Therapeutic exercise;DME and/or AE instruction;Therapeutic activities;Patient/family education;Balance training    OT Goals(Current goals can be found in the care plan section) Acute Rehab OT Goals Patient Stated Goal: to stand and walk with crutches OT Goal Formulation: With patient Time For Goal Achievement: 11/04/20 Potential to Achieve Goals: Fair  OT Frequency: Min 2X/week   Barriers to D/C:            Co-evaluation PT/OT/SLP Co-Evaluation/Treatment: Yes Reason for Co-Treatment: To address functional/ADL transfers PT goals addressed during session: Mobility/safety with mobility OT goals addressed during session:  (evaluation)      AM-PAC OT "6 Clicks" Daily Activity     Outcome Measure Help from another person eating meals?: None Help from another person taking care of personal grooming?: A Little Help from another person toileting, which includes using toliet, bedpan, or urinal?: Total Help from another person bathing (including washing, rinsing, drying)?: A Lot Help from another person to put on and taking off regular upper body clothing?: A Little Help from another person to put on and taking off regular lower  body clothing?: A Lot 6 Click Score: 15   End of Session Nurse Communication: Mobility status;Need for lift equipment  Activity Tolerance: Patient tolerated treatment well Patient left: in chair;with call bell/phone within reach;with chair alarm set  OT Visit Diagnosis: Muscle weakness (generalized) (M62.81);Pain;Other symptoms and signs involving the nervous system (R29.898) Pain - Right/Left: Left Pain - part of body: Hand                Time: 1040-1105 OT Time Calculation (min): 25 min Charges:  OT General Charges $OT Visit: 1 Visit OT Evaluation $OT Eval Moderate Complexity: 1 Mod  Megan Presti,  OTR/L [place]  Office [phone] Pager: [place] 10/21/2020, 1:47 PM

## 2020-10-21 NOTE — Progress Notes (Signed)
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    PT Comments    Pt is motivated to mobilize however he is very weak. Mod A for bed mobility and Total A for lateral scoot transfer into drop arm recliner. Poor ability to use his UEs to unweight buttocks and allow for scooting. Could possibly have more success with a sliding board. Per chart, CIR is not an option. He prefers to return home at discharge. He could potentially benefit from a ST rehab stay to get stronger and to decreased burden of care on family.    Follow Up Recommendations  SNF-if pt/family agreeable vs Home health PT;Supervision/Assistance - 24 hour (CIR not an option; Pt prefers home)     Equipment Recommendations  None recommended by PT    Recommendations for Other Services       Precautions / Restrictions Precautions Precautions: Fall Precaution Comments: L KAFO; very weak Restrictions Weight Bearing Restrictions: No    Mobility  Bed Mobility Overal bed mobility: Needs Assistance Bed Mobility: Supine to Sit     Supine to sit: Mod assist     General bed mobility comments: Assist for trunk and bil LEs Increased time. Unsteady.    Transfers Overall transfer level: Needs assistance   Transfers: Lateral/Scoot Transfers          Lateral/Scoot Transfers: Total assist;+2 safety/equipment General transfer comment: Multimodal cueing provided for safety, technique. Pt had significant difficulty using UEs to unweight buttocks to allow for scooting-poor technique.  Ambulation/Gait                  Stairs             Wheelchair Mobility    Modified Rankin (Stroke Patients Only)       Balance Overall balance assessment: Needs assistance   Sitting balance-Leahy Scale: Fair Sitting balance - Comments: Poor dynamic balance                                    Cognition Arousal/Alertness: Awake/alert Behavior During Therapy: WFL for tasks assessed/performed Overall Cognitive Status: Within Functional Limits for tasks assessed                                        Exercises      General Comments        Pertinent Vitals/Pain Pain Assessment: No/denies pain    Home Living                      Prior Function            PT Goals (current goals can now be found in the care plan section) Progress towards PT goals: Progressing toward goals    Frequency    Min 3X/week      PT Plan Discharge plan needs to be updated    Co-evaluation  AM-PAC PT "6 Clicks" Mobility   Outcome Measure  Help needed turning from your back to your side while in a flat bed without using bedrails?: A Lot Help needed moving from lying on your back to sitting on the side of a flat bed without using bedrails?: A Lot Help needed moving to and from a bed to a chair (including a wheelchair)?: Total Help needed standing up from a chair using your arms (e.g., wheelchair or bedside chair)?: Total Help needed to walk in hospital room?: Total Help needed climbing 3-5 steps with a railing? : Total 6 Click Score: 8    End of Session Equipment Utilized During Treatment: Gait belt Activity Tolerance: Patient tolerated treatment well Patient left: in chair;with call bell/phone within reach Nurse Communication: Need for lift equipment PT Visit Diagnosis: Muscle weakness (generalized) (M62.81);Other abnormalities of gait and mobility (R26.89)     Time: 1040-1104 PT Time Calculation (min) (ACUTE ONLY): 24 min  Charges:   $Therapeutic Activity: 8-22 mins                        Doreatha Massed, PT Acute Rehabilitation  Office: [phone] Pager: [phone]

## 2020-10-21 NOTE — Plan of Care (Signed)
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
## 2020-10-21 NOTE — Progress Notes (Signed)
Orthopedic Tech Progress Note Patient Details:  Eduardo Moon 18-Jun-1946 [id number]  Ortho Devices Type of Ortho Device: Velcro wrist splint Ortho Device/Splint Location: left Ortho Device/Splint Interventions: Application   Post Interventions Patient Tolerated: Well Instructions Provided: Care of device   [name] 10/21/2020, 10:16 AM

## 2020-10-21 NOTE — Plan of Care (Signed)

## 2020-10-22 NOTE — Plan of Care (Signed)
  Problem: Health Behavior/Discharge Planning: Goal: Ability to manage health-related needs will improve Outcome: Progressing   Problem: Nutrition: Goal: Adequate nutrition will be maintained Outcome: Progressing   Problem: Elimination: Goal: Will not experience complications related to bowel motility Outcome: Progressing   Problem: Pain Managment: Goal: General experience of comfort will improve Outcome: Progressing   

## 2020-10-22 NOTE — NC FL2 (Signed)
[place] LEVEL OF CARE SCREENING TOOL     IDENTIFICATION  Patient Name: Eduardo Moon Birthdate: 1946/05/22 Sex: male Admission Date (Current Location): 10/17/2020  [facility] and [place] Number:  Herbalist and Address:  [facility],  [place] [place], [place]      Provider Number: [id number]  Attending Physician Name and Address:  Kerney Elbe, DO  Relative Name and Phone Number:  Vilinda Boehringer [phone]    Current Level of Care: Hospital Recommended Level of Care: [place] Prior Approval Number:    Date Approved/Denied:   PASRR Number: [id number]  Discharge Plan: SNF    Current Diagnoses: Patient Active Problem List   Diagnosis Date Noted  . 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 Hypomagnesemia 10/17/2020  . Essential hypertension 10/17/2020  . Diarrhea 10/17/2020  . Anemia 06/22/2020  . AKI (acute kidney injury) ([place]) 06/22/2020  . Bradycardia 06/22/2020  . Hypokalemia 06/22/2020  . Leukocytosis (leucocytosis) 06/22/2020  . Limited mobility 06/22/2020  . Knee pain 06/22/2020  . HTN (hypertension) 06/11/2020  . Gout attack 06/11/2020  . Acute lower UTI 06/11/2020  . Weakness 06/11/2020  . UTI (urinary tract infection) 06/11/2020  . DM2 (diabetes mellitus, type 2) ([place]) 06/11/2020  . HLD (hyperlipidemia) 06/11/2020  . Acute prerenal azotemia 06/11/2020  . Epididymo-orchitis, acute 05/07/2012  . Scrotal abscess 05/07/2012    Orientation RESPIRATION BLADDER Height & Weight     Self,Time,Situation,Place  Normal Incontinent,External catheter Weight: 68 kg Height:  5\' 6"  (167.6 cm)  BEHAVIORAL SYMPTOMS/MOOD NEUROLOGICAL BOWEL NUTRITION STATUS      Incontinent Diet (Heart Healthy)  AMBULATORY STATUS COMMUNICATION OF NEEDS Skin   Limited Assist Verbally Normal                       Personal Care Assistance Level of Assistance  Bathing,Feeding,Dressing Bathing Assistance: Limited  assistance Feeding assistance: Limited assistance Dressing Assistance: Limited assistance     Functional Limitations Info  Sight,Hearing,Speech Sight Info: Adequate Hearing Info: Adequate Speech Info: Adequate    SPECIAL CARE FACTORS FREQUENCY  PT (By licensed PT),OT (By licensed OT)     PT Frequency: 5x week OT Frequency: 5x week            Contractures Contractures Info: Not present    Additional Factors Info  Code Status,Allergies (Functional paraplegicL leg brace) Code Status Info:  (Full) Allergies Info:  (NKA)           Current Medications (10/22/2020):  This is the current hospital active medication list Current Facility-Administered Medications  Medication Dose Route Frequency Provider Last Rate Last Admin  . acetaminophen (TYLENOL) tablet 650 mg  650 mg Oral Q6H PRN Chotiner, Yevonne Aline, MD   650 mg at 10/22/20 0531   Or  . acetaminophen (TYLENOL) suppository 650 mg  650 mg Rectal Q6H PRN Chotiner, Yevonne Aline, MD      . allopurinol (ZYLOPRIM) tablet 300 mg  300 mg Oral BID Chotiner, Yevonne Aline, MD   300 mg at 10/21/20 2148  . amLODipine (NORVASC) tablet 5 mg  5 mg Oral Daily Chotiner, Yevonne Aline, MD   5 mg at 10/21/20 1223  . diazepam (VALIUM) tablet 5 mg  5 mg Oral TID PRN Chotiner, Yevonne Aline, MD      . enoxaparin (LOVENOX) injection 40 mg  40 mg Subcutaneous Q24H Chotiner, Yevonne Aline, MD   40 mg at 10/21/20 1225  . [name]'s butt cream  Topical PRN Raiford Noble [place], DO      . HYDROcodone-acetaminophen (NORCO) 7.5-325 MG per tablet 1 tablet  1 tablet Oral Daily PRN Raiford Noble [place], DO   1 tablet at 10/21/20 1831  . lisinopril (ZESTRIL) tablet 30 mg  30 mg Oral Daily Chotiner, Yevonne Aline, MD   30 mg at 10/21/20 1224  . metoprolol succinate (TOPROL-XL) 24 hr tablet 100 mg  100 mg Oral Daily Chotiner, Yevonne Aline, MD   100 mg at 10/21/20 1221  . ondansetron (ZOFRAN) tablet 4 mg  4 mg Oral Q6H PRN Chotiner, Yevonne Aline, MD       Or  . ondansetron (ZOFRAN)  injection 4 mg  4 mg Intravenous Q6H PRN Chotiner, Yevonne Aline, MD      . pantoprazole (PROTONIX) EC tablet 40 mg  40 mg Oral Daily Raiford Noble [place], DO   40 mg at 10/21/20 1823  . polyethylene glycol (MIRALAX / GLYCOLAX) packet 17 g  17 g Oral BID Ronald Lobo, MD   17 g at 10/21/20 1225  . rosuvastatin (CRESTOR) tablet 5 mg  5 mg Oral QPM Chotiner, Yevonne Aline, MD   5 mg at 10/21/20 1823  . zolpidem (AMBIEN) tablet 5 mg  5 mg Oral QHS PRN Chotiner, Yevonne Aline, MD   5 mg at 10/21/20 2340     Discharge Medications: Please see discharge summary for a list of discharge medications.  Relevant Imaging Results:  Relevant Lab Results:   Additional Information SS#[id number]  Mahabir, Juliann Pulse, RN

## 2020-10-22 NOTE — TOC Progression Note (Signed)
Transition of Care [facility]) - Progression Note    Patient Details  Name: Eduardo Moon MRN: [id number] Date of Birth: September 22, 1945  Transition of Care [facility]) CM/SW Contact  Sidra Oldfield, Juliann Pulse, RN Phone Number: 10/22/2020, 2:47 PM  Clinical Narrative:  Dtr [name] chose [facility] following. CM initiated auth await auth,covid ordered.     Expected Discharge Plan: Skilled Nursing Facility Barriers to Discharge: Insurance Authorization  Expected Discharge Plan and Services Expected Discharge Plan: [place]   Discharge Planning Services: CM Consult   Living arrangements for the past 2 months: Single Family Home                                       Social Determinants of Health (SDOH) Interventions    Readmission Risk Interventions Readmission Risk Prevention Plan 06/26/2020 06/13/2020  Post Dischage Appt - Complete  Medication Screening - Complete  Transportation Screening Complete Complete  PCP or Specialist Appt within 3-5 Days Complete -  HRI or Home Care Consult Complete -  Social Work Consult for [place] Planning/Counseling Complete -  Palliative Care Screening Not Applicable -  Medication Review Press photographer) Complete -  Some recent data might be hidden

## 2020-10-22 NOTE — TOC Progression Note (Addendum)
Transition of Care [facility]) - Progression Note    Patient Details  Name: Eduardo Moon MRN: [id number] Date of Birth: 03-18-1946  Transition of Care [facility]) CM/SW Contact  Joniqua Sidle, Juliann Pulse, RN Phone Number: 10/22/2020, 9:03 AM  Clinical Narrative: PT-recc SNF-Per permission from patient/Melissa(dtr) agree to fax put for [facility].  Await bed offers prior initiating auth. Will need covid once bed chosen, & auth initiated. 12p-Bed offers given await choice prior auth.& ordering covid. 12:50p- 1. 0.9 mi [place] [facility] and [place], [place] [address] [phone] Overall rating Much above average 2. 1.3 mi [place] at [place], [place] [address] [phone] Overall rating Much below average 3. 2.3 mi [place], [place] [address] [phone] Overall rating Much below average 4. 2.7 mi [place] [address] [place], [place] [address] [phone] Overall rating Below average 5. 3 mi [facility] [place], [place] [address] [phone] Overall rating Below average 6. 3.1 [place], [place] [address] [phone] Overall rating Below average 7. 3.4 mi [facility] at the [place], [place] [address] [phone] Overall rating Below average 8. 3.4 Lane [place], [place] [address] [phone] Overall rating Above average 9. [place] [address] [place], [place] [address] [phone] Overall rating Much below average 10. 4.1 mi [facility] at [place], [place] [address] [phone] Overall rating Much above average 11. 4.2  [place], [place] [address] [phone] Overall rating Much above average 12. 4.2 mi [facility] [place], [place] [address] [phone] Overall rating Below average 13. [place], [place] [address] [phone] Overall rating Above average 14. 8.2 [place], [place] [address] [phone] Overall rating Above average 15. 8.4 mi [facility] [address], [place] [address] [phone] Overall rating Average 16. 8.7 mi [facility] [place], [place] [address] [phone] Overall rating Much above average 17. 9.6 mi [facility] and [place] [facility] [place], [place] [address] [phone] Overall rating Average 18. 10.3 mi [place] at [facility] [address] [place], [place] [address] [phone] Overall rating Much above average 19. 12.1 mi [facility] [address] [place], [place] [address] [phone] Overall rating Much below average 20. 12.2 [facility] [address] [place], [place] [address] [phone] Overall rating Much below average 21. 13.7 mi The [place] [address] [employer], [place] [address] [phone] Overall rating Much below average 22. 13.8 mi [facility] [place], [place] [address] [phone] Overall rating Below average 23. 14.[address] [place], [place] [address] [phone] Overall rating Below average 24. 14.5 [place] [address] [place], [place] [address] [phone] Overall rating Much below  average 25. 16.5 mi [place] [address] [place],  [address] [phone] Overall rating  Above average 26. 17.2 [place], [place] [address] [phone] Overall rating Much above average 27. 72.0 [facility] [address] [place], [place] [address] [phone] Overall rating Much below average 28. 18.4 mi [place], [place] [address] [phone] Overall rating Average 29. 19.2 mi [facility] and [facility] [address] [place], [place] [address] [phone] Overall rating Much below average 30. 20.5 mi [address] at the [facility] [place] at [facility], [place] [address] [phone] Overall rating Much above average 31. 21.2 mi [address] [place], [place] [address] [phone] Overall rating Below average 32. 21.2 [place], [place] [address] [phone] Overall rating Average 33. 21.4 [address] [place], [place] [address] [phone] Overall rating Much above average 34. [place] [address] [place], [place] [address] [phone] Overall rating Below average 35. 22.2 [place], [place] [address] [phone] Overall rating Much above average 36. 22.4 mi [facility] [place], [place] [address] [phone] Overall rating Much below average 37. 23.2 mi [facility] [address] [place], [place] [address] [phone] Overall rating Below average 38. 23.8 mi [facility] [address] [place], [place] [address] [phone] Overall rating Above average 39. 23.9 [facility] of [place], [place] [address] [phone] Overall rating Much below average 40. [place], [place] [address] [phone] Overall rating Much below average 41. [place] and [facility] [address] [place], [place] [address] [phone] Overall rating Average 42. 24.3 [facility] Care/[place] [address] [place], [place] [address] [phone] Overall rating Much below average 43. 24.3 mi [facility] [place], [place] [address] [phone] Overall rating Below average 44. 24.8 mi [place] [address] [place], [place] [address] [phone] Overall rating Above average 45. 24.9 mi The [place] at [place],  [address] [phone] Overall rating Much below average To explore and download nursing home data,visit the data catalog on CMS.gov    Expected Discharge Plan: Skilled Nursing Facility Barriers to Discharge: Continued Medical Work up  Expected Discharge Plan and Services Expected Discharge Plan: [place]   Discharge Planning Services: CM Consult   Living arrangements for the past 2 months: Single Family Home                                       Social Determinants of Health (SDOH) Interventions    Readmission Risk Interventions Readmission Risk Prevention Plan 06/26/2020 06/13/2020  Post Dischage Appt - Complete  Medication Screening - Complete  Transportation Screening Complete Complete  PCP or Specialist Appt within 3-5 Days Complete -  HRI or Home Care Consult Complete -  Social Work Consult for [place] Planning/Counseling Complete -  Palliative Care Screening Not Applicable -  Medication Review Press photographer) Complete -  Some recent data might be hidden

## 2020-10-22 NOTE — Care Management Important Message (Signed)
Important Message  Patient Details IM Letter given to the Patient. Name: Eduardo Moon MRN: [id number] Date of Birth: 1945-09-05   Medicare Important Message Given:  Yes     Kerin Salen 10/22/2020, 12:39 PM
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
## 2020-10-23 DIAGNOSIS — N179 Acute kidney failure, unspecified: Secondary | ICD-10-CM

## 2020-10-23 DIAGNOSIS — D72829 Elevated white blood cell count, unspecified: Secondary | ICD-10-CM

## 2020-10-23 LAB — CBC WITH DIFFERENTIAL/PLATELET
Abs Immature Granulocytes: 0.1 10*3/uL — ABNORMAL HIGH (ref 0.00–0.07)
Basophils Absolute: 0.1 10*3/uL (ref 0.0–0.1)
Basophils Relative: 1 %
Eosinophils Absolute: 0.4 10*3/uL (ref 0.0–0.5)
Eosinophils Relative: 3 %
HCT: 31.6 % — ABNORMAL LOW (ref 39.0–52.0)
Hemoglobin: 10 g/dL — ABNORMAL LOW (ref 13.0–17.0)
Immature Granulocytes: 1 %
Lymphocytes Relative: 22 %
Lymphs Abs: 2.5 10*3/uL (ref 0.7–4.0)
MCH: 30.3 pg (ref 26.0–34.0)
MCHC: 31.6 g/dL (ref 30.0–36.0)
MCV: 95.8 fL (ref 80.0–100.0)
Monocytes Absolute: 1 10*3/uL (ref 0.1–1.0)
Monocytes Relative: 9 %
Neutro Abs: 7.4 10*3/uL (ref 1.7–7.7)
Neutrophils Relative %: 64 %
Platelets: 281 10*3/uL (ref 150–400)
RBC: 3.3 MIL/uL — ABNORMAL LOW (ref 4.22–5.81)
RDW: 16 % — ABNORMAL HIGH (ref 11.5–15.5)
WBC: 11.4 10*3/uL — ABNORMAL HIGH (ref 4.0–10.5)
nRBC: 0 % (ref 0.0–0.2)

## 2020-10-23 LAB — COMPREHENSIVE METABOLIC PANEL
ALT: 23 U/L (ref 0–44)
AST: 10 U/L — ABNORMAL LOW (ref 15–41)
Albumin: 3 g/dL — ABNORMAL LOW (ref 3.5–5.0)
Alkaline Phosphatase: 65 U/L (ref 38–126)
Anion gap: 9 (ref 5–15)
BUN: 18 mg/dL (ref 8–23)
CO2: 20 mmol/L — ABNORMAL LOW (ref 22–32)
Calcium: 8.6 mg/dL — ABNORMAL LOW (ref 8.9–10.3)
Chloride: 104 mmol/L (ref 98–111)
Creatinine, Ser: 1.14 mg/dL (ref 0.61–1.24)
GFR, Estimated: >60 mL/min (ref >60)
Glucose, Bld: 116 mg/dL — ABNORMAL HIGH (ref 70–99)
Potassium: 3.9 mmol/L (ref 3.5–5.1)
Sodium: 133 mmol/L — ABNORMAL LOW (ref 135–145)
Total Bilirubin: 0.1 mg/dL — ABNORMAL LOW (ref 0.3–1.2)
Total Protein: 5.7 g/dL — ABNORMAL LOW (ref 6.5–8.1)

## 2020-10-23 LAB — SARS CORONAVIRUS 2 (TAT 6-24 HRS): SARS Coronavirus 2: NEGATIVE

## 2020-10-23 LAB — PHOSPHORUS: Phosphorus: 3.4 mg/dL (ref 2.5–4.6)

## 2020-10-23 LAB — MAGNESIUM: Magnesium: 1.8 mg/dL (ref 1.7–2.4)

## 2020-10-23 MED ORDER — LISINOPRIL 20 MG PO TABS: 20.0000 mg | ORAL_TABLET | Freq: Every day | ORAL | Status: DC

## 2020-10-23 MED ORDER — DIAZEPAM 5 MG PO TABS: 5.0000 mg | ORAL_TABLET | Freq: Three times a day (TID) | ORAL | 0 refills | Status: DC | PRN

## 2020-10-23 MED ORDER — SENNA 8.6 MG PO TABS: 1.0000 | ORAL_TABLET | Freq: Two times a day (BID) | ORAL | 0 refills | Status: DC

## 2020-10-23 MED ORDER — GERHARDT'S BUTT CREAM: 1.0000 "application " | TOPICAL_CREAM | Freq: Two times a day (BID) | CUTANEOUS | Status: DC

## 2020-10-23 MED ORDER — SODIUM CHLORIDE 0.9 % IV BOLUS
500.0000 mL | Freq: Once | INTRAVENOUS | Status: AC
  Administered 2020-10-23: 500 mL via INTRAVENOUS

## 2020-10-23 MED ORDER — HYDROCODONE-ACETAMINOPHEN 7.5-325 MG PO TABS: 1.0000 | ORAL_TABLET | Freq: Every day | ORAL | 0 refills | Status: DC | PRN

## 2020-10-23 MED ORDER — ONDANSETRON HCL 4 MG PO TABS: 4.0000 mg | ORAL_TABLET | Freq: Four times a day (QID) | ORAL | 0 refills | Status: DC | PRN

## 2020-10-23 MED ORDER — MAGNESIUM SULFATE 2 GM/50ML IV SOLN
2.0000 g | Freq: Once | INTRAVENOUS | Status: AC
  Administered 2020-10-23: 2 g via INTRAVENOUS
  Filled 2020-10-23: qty 50

## 2020-10-23 MED ORDER — SODIUM CHLORIDE 0.9 % IV SOLN: INTRAVENOUS | Status: DC

## 2020-10-23 MED ORDER — ACETAMINOPHEN 325 MG PO TABS: 650.0000 mg | ORAL_TABLET | Freq: Four times a day (QID) | ORAL | 0 refills | Status: DC | PRN

## 2020-10-23 MED ORDER — POLYETHYLENE GLYCOL 3350 17 G PO PACK: 17.0000 g | PACK | Freq: Two times a day (BID) | ORAL | 0 refills | Status: DC

## 2020-10-23 NOTE — Plan of Care (Signed)
  Problem: Health Behavior/Discharge Planning: Goal: Ability to manage health-related needs will improve Outcome: Progressing   Problem: Clinical Measurements: Goal: Ability to maintain clinical measurements within normal limits will improve Outcome: Progressing   Problem: Activity: Goal: Risk for activity intolerance will decrease Outcome: Progressing   Problem: Elimination: Goal: Will not experience complications related to bowel motility Outcome: Progressing   Problem: Pain Managment: Goal: General experience of comfort will improve Outcome: Progressing   Problem: Safety: Goal: Ability to remain free from injury will improve Outcome: Progressing

## 2020-10-23 NOTE — Discharge Summary (Signed)
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  --  20* 21* 21* 20*  GLUCOSE 97  --  87 89 91 116*  BUN 8  --  _0 CREATININE 0.68  --  0.71 0.76 0.94 1.14  CALCIUM 8.3*  --  8.4* 8.4* 8.9 8.6*  MG  --  1.7 1.9 1.8 2.1 1.8  PHOS  --  2.3* 4.5 3.1 3.6 3.4   Liver Function Tests: Recent Labs  Lab 10/18/20 0803 10/19/20 0523 10/20/20 0607 10/21/20 0504 10/23/20 0500  AST 8* 8* 9* 10* 10*  ALT _1 ALKPHOS 64 62 57 66 65  BILITOT 0.4 0.5 0.4 0.4 0.1*  PROT 5.5* 5.5* 5.2* 5.9* 5.7*  ALBUMIN 2.9* 2.9* 2.8* 3.1* 3.0*   Recent Labs  Lab 10/17/20 1400  LIPASE 26   No results for input(s): AMMONIA in the last 168 hours. CBC: Recent Labs  Lab 10/17/20 1400 10/18/20 0526 10/19/20 0523 10/20/20 0607 10/21/20 0504 10/23/20 0500   WBC 10.4 10.3 10.9* 7.5 9.0 11.4*  NEUTROABS 8.2*  --  8.1* 4.5 5.7 7.4  HGB 11.1* 9.9* 9.9* 9.5* 10.8* 10.0*  HCT 33.3* 30.2* 29.9* 29.2* 32.3* 31.6*  MCV 93.3 94.4 93.4 95.1 92.0 95.8  PLT 290 258 248 248 283 281   Cardiac Enzymes: No results for input(s): CKTOTAL, CKMB, CKMBINDEX, TROPONINI in the last 168 hours. BNP: Invalid input(s): POCBNP CBG: No results for input(s): GLUCAP in the last 168 hours. D-Dimer No results for input(s): DDIMER in the last 72 hours. Hgb A1c No results for input(s): HGBA1C in the last 72 hours. Lipid Profile No results for input(s): CHOL, HDL, LDLCALC, TRIG, CHOLHDL, LDLDIRECT in the last 72 hours. Thyroid function studies No results for input(s): TSH, T4TOTAL, T3FREE, THYROIDAB in the last 72 hours.  Invalid input(s): FREET3 Anemia work up No results for input(s): VITAMINB12, FOLATE, FERRITIN, TIBC, IRON, RETICCTPCT in the last 72 hours. Urinalysis    Component Value Date/Time   COLORURINE YELLOW (A) 10/17/2020 1541   APPEARANCEUR CLOUDY (A) 10/17/2020 1541   LABSPEC 1.011 10/17/2020 1541   PHURINE 5.0 10/17/2020 1541   GLUCOSEU NEGATIVE 10/17/2020 1541   HGBUR NEGATIVE 10/17/2020 1541   BILIRUBINUR NEGATIVE 10/17/2020 1541   KETONESUR NEGATIVE 10/17/2020 1541   PROTEINUR NEGATIVE 10/17/2020 1541   UROBILINOGEN 1.0 04/16/2012 1950   NITRITE NEGATIVE 10/17/2020 1541   LEUKOCYTESUR SMALL (A) 10/17/2020 1541   Sepsis Labs Invalid input(s): PROCALCITONIN,  WBC,  LACTICIDVEN Microbiology Recent Results (from the past 240 hour(s))  C Difficile Quick Screen w PCR reflex     Status: None   Collection Time: 10/17/20  3:41 PM   Specimen: STOOL  Result Value Ref Range Status   C Diff antigen NEGATIVE NEGATIVE Final   C Diff toxin NEGATIVE NEGATIVE Final   C Diff interpretation No C. difficile detected.  Final    Comment: Performed at [facility], [place] [address] [place], [place] [address]  Gastrointestinal Panel by PCR ,  Stool     Status: None   Collection Time: 10/17/20  3:41 PM   Specimen: Stool  Result Value Ref Range Status   Campylobacter species NOT DETECTED NOT DETECTED Final   Plesimonas shigelloides NOT DETECTED NOT DETECTED Final   Salmonella species NOT DETECTED NOT DETECTED Final   Yersinia enterocolitica NOT DETECTED NOT DETECTED Final   Vibrio species NOT DETECTED NOT DETECTED Final   Vibrio cholerae NOT DETECTED NOT DETECTED Final   Enteroaggregative E  coli (EAEC) NOT DETECTED NOT DETECTED Final   Enteropathogenic E coli (EPEC) NOT DETECTED NOT DETECTED Final   Enterotoxigenic E coli (ETEC) NOT DETECTED NOT DETECTED Final   Shiga like toxin producing E coli (STEC) NOT DETECTED NOT DETECTED Final   Shigella/Enteroinvasive E coli (EIEC) NOT DETECTED NOT DETECTED Final   Cryptosporidium NOT DETECTED NOT DETECTED Final   Cyclospora cayetanensis NOT DETECTED NOT DETECTED Final   Entamoeba histolytica NOT DETECTED NOT DETECTED Final   Giardia lamblia NOT DETECTED NOT DETECTED Final   Adenovirus F40/41 NOT DETECTED NOT DETECTED Final   Astrovirus NOT DETECTED NOT DETECTED Final   Norovirus GI/GII NOT DETECTED NOT DETECTED Final   Rotavirus A NOT DETECTED NOT DETECTED Final   Sapovirus (I, II, IV, and V) NOT DETECTED NOT DETECTED Final    Comment: Performed at [facility], [address] [place], [place] [address]  SARS CORONAVIRUS 2 (TAT 6-24 HRS) Nasopharyngeal Nasopharyngeal Swab     Status: None   Collection Time: 10/17/20  8:20 PM   Specimen: Nasopharyngeal Swab  Result Value Ref Range Status   SARS Coronavirus 2 NEGATIVE NEGATIVE Final    Comment: (NOTE) SARS-CoV-2 target nucleic acids are NOT DETECTED.  The SARS-CoV-2 RNA is generally detectable in upper and lower respiratory specimens during the acute phase of infection. Negative results do not preclude SARS-CoV-2 infection, do not rule out co-infections with other pathogens, and should not be used as the sole basis for  treatment or other patient management decisions. Negative results must be combined with clinical observations, patient history, and epidemiological information. The expected result is Negative.  Fact Sheet for Patients: SugarRoll.be  Fact Sheet for Healthcare Providers: https://www.woods-mathews.com/  This test is not yet approved or cleared by the [place] FDA and  has been authorized for detection and/or diagnosis of SARS-CoV-2 by FDA under an Emergency Use Authorization (EUA). This EUA will remain  in effect (meaning this test can be used) for the duration of the COVID-19 declaration under Se ction 564(b)(1) of the Act, 21 U.S.C. section 360bbb-3(b)(1), unless the authorization is terminated or revoked sooner.  Performed at [facility], [place] [address] [place], [place] [address]   SARS CORONAVIRUS 2 (TAT 6-24 HRS) Nasopharyngeal Nasopharyngeal Swab     Status: None   Collection Time: 10/22/20  3:58 PM   Specimen: Nasopharyngeal Swab  Result Value Ref Range Status   SARS Coronavirus 2 NEGATIVE NEGATIVE Final    Comment: (NOTE) SARS-CoV-2 target nucleic acids are NOT DETECTED.  The SARS-CoV-2 RNA is generally detectable in upper and lower respiratory specimens during the acute phase of infection. Negative results do not preclude SARS-CoV-2 infection, do not rule out co-infections with other pathogens, and should not be used as the sole basis for treatment or other patient management decisions. Negative results must be combined with clinical observations, patient history, and epidemiological information. The expected result is Negative.  Fact Sheet for Patients: SugarRoll.be  Fact Sheet for Healthcare Providers: https://www.woods-mathews.com/  This test is not yet approved or cleared by the [place] FDA and  has been authorized for detection and/or diagnosis of SARS-CoV-2  by FDA under an Emergency Use Authorization (EUA). This EUA will remain  in effect (meaning this test can be used) for the duration of the COVID-19 declaration under Se ction 564(b)(1) of the Act, 21 U.S.C. section 360bbb-3(b)(1), unless the authorization is terminated or revoked sooner.  Performed at [facility], [place] [address] [place], [place] [address]  Time coordinating discharge: 35 minutes  SIGNED:  Kerney Elbe, DO [facility] 10/23/2020, 12:49 PM Pager is on [place]  If 7PM-7AM, please contact night-coverage [email or web address]

## 2020-10-23 NOTE — TOC Progression Note (Addendum)
Transition of Care [facility]) - Progression Note    Patient Details  Name: Eduardo Moon MRN: [id number] Date of Birth: [birth year]  Transition of Care [facility]) CM/SW Contact  Dacian Orrico, Juliann Pulse, RN Phone Number: 10/23/2020, 8:48 AM  Clinical Narrative: Received call from dtr Melissa wanting to change from [facility] to [facility]-will have to initiate new auth. Updated [place] pending. -12p-auth received for [facility]-ref#[id number], YTKZ#[id number].DDUKG d/c summary & rm#,tel# for report prior calling PTAR.  2p-going to [facility]-rep Kitty-going to rm#[id number];tel# report [phone],SEG for [place]. pTAR called.No further CM needs.     Expected Discharge Plan: Skilled Nursing Facility Barriers to Discharge: Insurance Authorization  Expected Discharge Plan and Services Expected Discharge Plan: [place]   Discharge Planning Services: CM Consult   Living arrangements for the past 2 months: Single Family Home                                       Social Determinants of Health (SDOH) Interventions    Readmission Risk Interventions Readmission Risk Prevention Plan 06/26/2020 06/13/2020  Post Dischage Appt - Complete  Medication Screening - Complete  Transportation Screening Complete Complete  PCP or Specialist Appt within 3-5 Days Complete -  HRI or Home Care Consult Complete -  Social Work Consult for [place] Planning/Counseling Complete -  Palliative Care Screening Not Applicable -  Medication Review Press photographer) Complete -  Some recent data might be hidden

## 2020-10-23 NOTE — Plan of Care (Signed)

## 2020-10-24 ENCOUNTER — Encounter: Payer: Self-pay | Admitting: Adult Health

## 2020-10-24 ENCOUNTER — Non-Acute Institutional Stay (SKILLED_NURSING_FACILITY): Payer: Medicare Other | Admitting: Adult Health

## 2020-10-24 DIAGNOSIS — K5641 Fecal impaction: Secondary | ICD-10-CM | POA: Diagnosis not present

## 2020-10-24 DIAGNOSIS — M109 Gout, unspecified: Secondary | ICD-10-CM

## 2020-10-24 DIAGNOSIS — S638X2S Sprain of other part of left wrist and hand, sequela: Secondary | ICD-10-CM

## 2020-10-24 DIAGNOSIS — I10 Essential (primary) hypertension: Secondary | ICD-10-CM

## 2020-10-24 DIAGNOSIS — E876 Hypokalemia: Secondary | ICD-10-CM | POA: Diagnosis not present

## 2020-10-24 DIAGNOSIS — D649 Anemia, unspecified: Secondary | ICD-10-CM | POA: Diagnosis not present

## 2020-10-24 NOTE — Progress Notes (Signed)
Location:  [facility] Room Number: 638G Place of Service:  SNF (31) Provider:  Durenda Age, DNP, FNP-BC  Patient Care Team: Timoteo Gaul, FNP as PCP - General (Family Medicine)  Extended Emergency Contact Information Primary Emergency Contact: [facility] Address: [address]          [place], [place] [address] Johnnette Litter of [place] Phone: [phone] Mobile Phone: [phone] Relation: Daughter  Code Status:  Full Code  Goals of care: Advanced Directive information Advanced Directives 10/17/2020  Does Patient Have a Medical Advance Directive? Yes  Type of Paramedic of [place];Living will  Does patient want to make changes to medical advance directive? No - Patient declined  Copy of [place] in Chart? No - copy requested  Would patient like information on creating a medical advance directive? No - Patient declined  Pre-existing out of facility DNR order (yellow form or pink MOST form) -     Chief Complaint  Patient presents with  . Acute Visit    Hospital follow up    HPI:  Pt is a 75 y.o. male who was admitted to [facility] on 10/23/2020 post hospitalization 10/17/2020 to 10/23/2020.  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  He had a history of 2-1/2-week continues diarrhea.  There was no reported mucus or blood in the stool.  He used Imodium and Pepto-Bismol with minimal transient relief.  He had abdominal cramping intermittently.  CT scan showed some distal proctitis with a large stool burden.  Patient was given a dose of Zosyn in ER for antibiotic coverage. Gastroenterology was consulted and his electrolytes were repleted -potassium 2.7 and magnesium 1.6.  GI did a partial digital disimpaction and ordered mineral oil  retention enema, as well as increased his laxatives.  His renal function slightly went up and was given 500 mL IV bolus.  He was seen in his room today.  He stated that he had a good bowel movement yesterday.  He stated that he has been on wheelchair since October 2021 after having a gout flareup.  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  Past Medical History:  Diagnosis Date  . Arthritis   . History of prostate cancer s/p radiactive seed implants  03-24-2011  . Hypertension   . Mild acid reflux watches diet  . Polio age 61  . Scrotal abscess   . Weakness 05/2020  . 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 ANKLE RECONSTRUCTION  1957   BILATERAL  . ORCHIECTOMY  05/07/2012   Procedure: ORCHIECTOMY;  Surgeon: Bernestine Amass, MD;  [facility];  Service: Urology;  Laterality: Right;  . RADIOACTIVE PROSTATE SEED IMPLANTS  04-21-2011    [facility]   PROSTATE CANCER  . SCROTAL EXPLORATION  05/07/2012   Procedure: SCROTUM EXPLORATION;  Surgeon: Bernestine Amass, MD;  Location: [facility];  Service: Urology;  Laterality: Right;  DRAINAGE OF ABSCESS  1 HR  UHC MEDICARE    No Known Allergies  Outpatient Encounter Medications as of 10/24/2020  Medication Sig  . acetaminophen (TYLENOL) 325 MG tablet Take 2 tablets (650 mg total) by mouth every 6 (six) hours as needed  for mild pain (or Fever >/= 101).  Mar[place] Kitchen allopurinol (ZYLOPRIM) 300 MG tablet Take 300 mg by mouth 2 (two) times daily.  Marland Kitchen amLODipine (NORVASC) 5 MG tablet Take 1 tablet (5 mg total) by mouth daily.  . bisacodyl (DULCOLAX) 10 MG suppository Place 10 mg rectally as needed for moderate constipation.  . blood glucose meter kit and supplies KIT Dispense based on patient and insurance preference. Use up to four times daily as directed. (FOR ICD-9 250.00, 250.01). (Patient taking  differently: Inject 1 each into the skin See admin instructions. Dispense based on patient and insurance preference. Use up to four times daily as directed. (FOR ICD-9 250.00, 250.01).)  . diazepam (VALIUM) 5 MG tablet Take 5 mg by mouth every 8 (eight) hours as needed for anxiety.  . ferrous sulfate 325 (65 FE) MG tablet Take 1 tablet (325 mg total) by mouth daily with breakfast.  . HYDROcodone-acetaminophen (NORCO) 7.5-325 MG tablet Take 1 tablet by mouth daily as needed.  Marland Kitchen lisinopril (ZESTRIL) 20 MG tablet Take 1 tablet (20 mg total) by mouth daily.  . magnesium hydroxide (MILK OF MAGNESIA) 400 MG/5ML suspension Take 30 mLs by mouth daily as needed for mild constipation.  . metoprolol succinate (TOPROL-XL) 100 MG 24 hr tablet Take 100 mg by mouth daily.  Marland Kitchen Nystatin ([name]'S BUTT CREAM) CREA Apply 1 application topically 2 (two) times daily.  Marland Kitchen omeprazole (PRILOSEC) 20 MG capsule Take 20 mg by mouth 2 (two) times daily.  . ondansetron (ZOFRAN) 4 MG tablet Take 1 tablet (4 mg total) by mouth every 6 (six) hours as needed for nausea.  . polyethylene glycol (MIRALAX / GLYCOLAX) 17 g packet Take 17 g by mouth 2 (two) times daily.  . rosuvastatin (CRESTOR) 5 MG tablet Take 5 mg by mouth every evening.   . senna (SENOKOT) 8.6 MG TABS tablet Take 1 tablet (8.6 mg total) by mouth 2 (two) times daily.  . vitamin B-12 (CYANOCOBALAMIN) 500 MCG tablet Take 1 tablet (500 mcg total) by mouth daily.  . Vitamin D, Ergocalciferol, (DRISDOL) 1.25 MG (50000 UNIT) CAPS capsule Take 50,000 Units by mouth once a week.  . [DISCONTINUED] diazepam (VALIUM) 5 MG tablet Take 1 tablet (5 mg total) by mouth 3 (three) times daily as needed for anxiety. (Patient taking differently: Take 5 mg by mouth every 8 (eight) hours as needed for anxiety.)   No facility-administered encounter medications on file as of 10/24/2020.    Review of Systems  GENERAL: No change in appetite, no fatigue, no weight changes, no fever, chills  or weakness MOUTH and THROAT: Denies oral discomfort, gingival pain or bleeding RESPIRATORY: no cough, SOB, DOE, wheezing, hemoptysis CARDIAC: No chest pain, edema or palpitations GI: No abdominal pain, diarrhea, constipation, heart burn, nausea or vomiting GU: Denies dysuria, frequency, hematuria or discharge NEUROLOGICAL: Denies dizziness, syncope, numbness, or headache PSYCHIATRIC: Denies feelings of depression or anxiety. No report of hallucinations, insomnia, paranoia, or agitation    There is no immunization history on file for this patient. Pertinent  Health Maintenance Due  Topic Date Due  . FOOT EXAM  Never done  . OPHTHALMOLOGY EXAM  Never done  . PNA vac Low Risk Adult (1 of 2 - PCV13) Never done  . INFLUENZA VACCINE  Never done  . HEMOGLOBIN A1C  12/20/2020   No flowsheet data found.   Vitals:   10/24/20 1011  BP: 134/72  Pulse: 80  Resp: 17  Temp: 97.9 F (36.6 C)  Weight: 148  lb 3.2 oz (67.2 kg)  Height: 5' 6"  (1.676 m)   Body mass index is 23.92 kg/m.  Physical Exam  GENERAL APPEARANCE: Well nourished. In no acute distress. Normal body habitus SKIN:  Skin is warm and dry.  MOUTH and THROAT: Lips are without lesions. Oral mucosa is moist and without lesions. Tongue is normal in shape, size, and color and without lesions RESPIRATORY: Breathing is even & unlabored, BS CTAB CARDIAC: RRR, no murmur,no extra heart sounds, no edema GI: Abdomen soft, normal BS, no masses, no tenderness EXTREMITIES:  Left wrist with splint and Left lower leg with brace. NEUROLOGICAL: There is no tremor. Speech is clear. Alert and oriented X 3. PSYCHIATRIC:  Affect and behavior are appropriate  Labs reviewed: Recent Labs    10/20/20 0607 10/21/20 0504 10/23/20 0500  NA 134* 133* 133*  K 4.1 3.6 3.9  CL 106 103 104  CO2 21* 21* 20*  GLUCOSE 89 91 116*  BUN 11 10 18   CREATININE 0.76 0.94 1.14  CALCIUM 8.4* 8.9 8.6*  MG 1.8 2.1 1.8  PHOS 3.1 3.6 3.4   Recent Labs     10/20/20 0607 10/21/20 0504 10/23/20 0500  AST 9* 10* 10*  ALT 17 20 23   ALKPHOS 57 66 65  BILITOT 0.4 0.4 0.1*  PROT 5.2* 5.9* 5.7*  ALBUMIN 2.8* 3.1* 3.0*   Recent Labs    10/20/20 0607 10/21/20 0504 10/23/20 0500  WBC 7.5 9.0 11.4*  NEUTROABS 4.5 5.7 7.4  HGB 9.5* 10.8* 10.0*  HCT 29.2* 32.3* 31.6*  MCV 95.1 92.0 95.8  PLT 248 283 281   Lab Results  Component Value Date   TSH 1.993 06/22/2020   Lab Results  Component Value Date   HGBA1C 5.7 (H) 06/22/2020   Lab Results  Component Value Date   CHOL 97 06/12/2020   HDL 26 (L) 06/12/2020   LDLCALC 54 06/12/2020   TRIG 87 06/12/2020   CHOLHDL 3.7 06/12/2020    Significant Diagnostic Results in last 30 days:  DG Wrist 2 Views Left  Result Date: 10/20/2020 CLINICAL DATA:  Pain EXAM: LEFT WRIST - 2 VIEW COMPARISON:  None FINDINGS: Osseous demineralization. Markedly widened scapholunate interval consistent with torn scapholunate ligament. Ulnar minus variance. Mild degenerative changes at STT joint and radiocarpal joint. No acute fracture, dislocation, or bone destruction. IMPRESSION: Scattered degenerative changes with torn scapholunate ligament and ulnar minus variance. No acute fracture or dislocation. Electronically Signed   By: Lavonia Dana M.D.   On: 10/20/2020 15:29   CT ABDOMEN PELVIS W CONTRAST  Result Date: 10/17/2020 CLINICAL DATA:  Diverticulitis, diarrhea, unspecified abdominal pain EXAM: CT ABDOMEN AND PELVIS WITH CONTRAST TECHNIQUE: Multidetector CT imaging of the abdomen and pelvis was performed using the standard protocol following bolus administration of intravenous contrast. CONTRAST:  150m OMNIPAQUE IOHEXOL 300 MG/ML  SOLN COMPARISON:  06/25/2020 FINDINGS: Lower chest: The visualized lung bases are clear bilaterally. The visualized heart and pericardium are unremarkable. Hepatobiliary: No focal liver abnormality is seen. No gallstones, gallbladder wall thickening, or biliary dilatation. Pancreas:  Unremarkable Spleen: Unremarkable Adrenals/Urinary Tract: The adrenal glands are unremarkable. 2.8 cm simple cortical cyst noted within the upper pole the left kidney. The kidneys are otherwise unremarkable. The bladder is unremarkable. Stomach/Bowel: Large stool within the colon and particularly within the rectal vault with mild perirectal inflammatory stranding and rectal wall thickening possibly reflecting changes of stercoral proctitis. The stomach, small bowel, and large bowel are otherwise unremarkable. Appendix is unremarkable. No free intraperitoneal  gas or fluid. Vascular/Lymphatic: Moderate atherosclerotic calcification within the abdominal aorta. No aortic aneurysm. No pathologic adenopathy within the abdomen and pelvis. Reproductive: Brachytherapy seeds are seen within the prostate gland, unchanged. Seminal vesicles are unremarkable. Other: Tiny fat containing umbilical hernia. Musculoskeletal: There is marked wasting of the left ileo psoas and gluteal musculature. There is wasting of the visualized proximal thigh musculature bilaterally, left greater than right. No acute bone abnormality. IMPRESSION: Large stool within the distal colon and particularly within the rectal vault now with mild rectal wall thickening and perirectal inflammatory stranding suggesting changes of stercoral proctitis. Marked wasting of the lower extremity musculature, left greater than right, unchanged. Aortic Atherosclerosis (ICD10-I70.0). Electronically Signed   By: Fidela Salisbury MD   On: 10/17/2020 16:52   DG Hand 2 View Left  Result Date: 10/20/2020 CLINICAL DATA:  Pain EXAM: LEFT HAND - 2 VIEW COMPARISON:  None FINDINGS: Osseous demineralization. Joint space narrowing at STT joint, third MCP joint, and scattered IP joints. No acute fracture, dislocation, or bone destruction. Three tiny radiopaque foreign bodies are seen within the palmar soft tissues of the LEFT hand. Widened scapholunate interval consistent with torn  scapholunate ligament. IMPRESSION: Torn scapholunate ligament. Scattered degenerative changes greatest at STT joint and third MCP joint. No acute fracture or dislocation. Electronically Signed   By: Lavonia Dana M.D.   On: 10/20/2020 15:15    Assessment/Plan  1. Fecal impaction (HCC) -   CT Abd/pelvis showed large stool with distal colon and particularly with the rectal vault with mild rectal wall thickening and perirectal inflammatory stranding suggesting changes of stercoral proctitis -   GI was consulted and did a partial digital disimpaction and recommended a mineral oil retention enema and increasing MiraLAX to twice a day -   Follow-up with gastroenterology in 1 to 2 weeks  2. Hypokalemia -  K was 2.7, repleted, now K 3.6  3. Hypomagnesemia -   Mg 1.6, repleted, now Mg 1.8  4. Anemia, unspecified type Lab Results  Component Value Date   WBC 11.4 (H) 10/23/2020   HGB 10.0 (L) 10/23/2020   HCT 31.6 (L) 10/23/2020   MCV 95.8 10/23/2020   PLT 281 10/23/2020   -   Continue ferrous sulfate  5. Essential hypertension -  BPs stable, continue amlodipine, metoprolol succinate ER and lisinopril  6. Gouty arthritis -   Continue allopurinol  7. Tear of left scapholunate ligament, sequela -   Wrist x-ray showed scattered degenerative changes with torn scapholunate ligament and ulnar minus variance.  No acute fracture or dislocation -   Hand x-ray showed torn scapholunate ligament, scattered degenerative changes greatest at STT joint and third MCP joint.  No acute fracture or dislocation -   Both hands surgery and orthopedic surgery recommended a wrist splint and to follow-up as outpatient. -   Follow-up with hand surgery, Dr. Corine Shelter, in 1 to 2 weeks   Family/ staff Communication: Discussed plan of care with resident and charge nurse.  Labs/tests ordered: None  Goals of care:   Short-term care   Durenda Age, DNP, MSN, FNP-BC [facility] and Adult  Medicine [phone] (Monday-Friday 8:00 a.m. - 5:00 p.m.) [phone] (after hours)

## 2020-10-29 ENCOUNTER — Non-Acute Institutional Stay (SKILLED_NURSING_FACILITY): Payer: Medicare Other | Admitting: Internal Medicine

## 2020-10-29 ENCOUNTER — Encounter: Payer: Self-pay | Admitting: Internal Medicine

## 2020-10-29 DIAGNOSIS — N390 Urinary tract infection, site not specified: Secondary | ICD-10-CM

## 2020-10-29 DIAGNOSIS — E119 Type 2 diabetes mellitus without complications: Secondary | ICD-10-CM

## 2020-10-29 DIAGNOSIS — M109 Gout, unspecified: Secondary | ICD-10-CM

## 2020-10-29 DIAGNOSIS — R197 Diarrhea, unspecified: Secondary | ICD-10-CM | POA: Diagnosis not present

## 2020-10-29 DIAGNOSIS — S638X2D Sprain of other part of left wrist and hand, subsequent encounter: Secondary | ICD-10-CM

## 2020-10-29 DIAGNOSIS — D649 Anemia, unspecified: Secondary | ICD-10-CM | POA: Diagnosis not present

## 2020-10-29 DIAGNOSIS — S638X2A Sprain of other part of left wrist and hand, initial encounter: Secondary | ICD-10-CM | POA: Insufficient documentation

## 2020-10-29 NOTE — Assessment & Plan Note (Addendum)
Glucoses while hospitalized ranged from a low of 87 up to 116.  Most recent A1c on record was 5.7% on 06/22/2020 indicating prediabetes.

## 2020-10-29 NOTE — Patient Instructions (Signed)
See assessment and plan under each diagnosis in the problem list and acutely for this visit 

## 2020-10-29 NOTE — Assessment & Plan Note (Addendum)
No bleeding dyscrasias noted.  He is presently on iron for the acute anemia in the hospital.  As noted GI did evaluate the patient.  The iron supplement should be discontinued once he is back at his baseline H/H.  If anemia recurs he would need an extensive GI evaluation.

## 2020-10-29 NOTE — Assessment & Plan Note (Addendum)
He describes having a UTI in November 2021.  Culture dated 11/28 revealed no growth.

## 2020-10-29 NOTE — Progress Notes (Signed)
NURSING HOME LOCATION:  [facility] ROOM NUMBER:  [id number]  CODE STATUS:  Full Code  PCP:  Nile Riggs, FNP  This is a comprehensive admission note to this SNFperformed on this date less than 30 days from date of admission. Included are preadmission medical/surgical history; reconciled medication list; family history; social history and comprehensive review of systems.  Corrections and additions to the records were documented. Comprehensive physical exam was also performed. Additionally a clinical summary was entered for each active diagnosis pertinent to this admission in the Problem List to enhance continuity of care.  HPI: Patient was hospitalized 3/23-3/29/2022 presenting from home with a 2-1/2-week history of continuous "diarrhea".  This was in the context of having been treated for UTI approximately a month prior. The record states he described 4-6 "loose" ( see his current description in ROS) bowel movements a day with some associated cramping intermittent abdominal pain.  This was impacting sleep.  It did not impact the intake of food or drink; but he described the rapid passage of loose stool after eating.  In the ED CT scan showed distal proctitis with a large stool burden.  Initially he received empiric antibiotics but GI consultant discontinued this as no infectious etiology was felt to be present. Hospital course was complicated by slight decrease in renal function on BMET,hypokalemia, hyponatremia and hypomagnesemia. Initial BUN/creatinine was 8/0.68; final values were 18/1.14.  Lisinopril was reduced from 30 mg daily to 20 mg daily. Potassium was 3.7 at admission but was repleted to 3.9 prior to discharge.   Phosphorus level was 2.3 but did improve to 3.4.  Magnesium level was 1.8 prompting repletion with IV mag sulfate 2 g prior to discharge.  Urinalysis revealed greater than 50 white blood cells; culture was not done as patient was  asymptomatic. Hemoglobin/hematocrit was 11.1/33.3 at admission but dropped to 9.5/29.2.  Iron level was 46 and TIBC 145.  Final H/H was 10/31.6.  No bleeding dyscrasias were noted. He complained of left hand numbness and pain in the context of a scapholunate ligament tear on imaging.  Symptoms are localized to the 3 lateral fingers.  Apparently this may have been traumatic when his family tried to lift him and grabbed the wrist accidentally.  Orthopedic surgery recommended a wrist splint with outpatient follow-up with Dr. Stann Mainland of [facility] in 1-2 weeks. 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  Prior to admission had been bedbound and nonambulatory since October 2021. CIR was recommended but he was not felt to be a candidate prompting the pursuit of SNF placement.  Past medical and surgical history: Includes history of polio at age 27, GERD, essential hypertension,hx of gout, and history of prostate cancer. Surgeries and procedures include ankle reconstruction, orchiectomy, and radioactive prostate seed implants.  Social history: Nondrinker, never smoked.  He is a retired Building control surveyor.  Family history: Limited history updated.   Review of systems: He now states that he was hospitalized because of acute gout in the right knee and ankle.  He stated this flared in November 2021 when he was treated for a UTI.  He did validate that he was having frequent stools which he described as frankly "watery", not loose.  He states that the gout and the stool changes have resolved. He describes a bad taste in his mouth since March as being fecal in nature.  Constitutional: No fever, significant weight change  Eyes: No redness, discharge, pain, vision change ENT/mouth: No nasal congestion,  purulent discharge, earache, change in hearing, sore throat  Cardiovascular: No chest pain, palpitations, paroxysmal nocturnal dyspnea, claudication, edema  Respiratory: No cough, sputum production,  hemoptysis, DOE, significant snoring, apnea Gastrointestinal: No heartburn, dysphagia, abdominal pain, nausea /vomiting, rectal bleeding, melena, change in bowels Genitourinary: No dysuria, hematuria, pyuria, incontinence, nocturia Dermatologic: No rash, pruritus, change in appearance of skin Neurologic: No dizziness, headache, syncope, seizures Psychiatric: No significant anxiety, depression, insomnia, anorexia Endocrine: No change in hair/skin/nails, excessive thirst, excessive hunger, excessive urination  Hematologic/lymphatic: No significant bruising, lymphadenopathy, abnormal bleeding Allergy/immunology: No itchy/watery eyes, significant sneezing, urticaria, angioedema  Physical exam:  Pertinent or positive findings: He is extremely polite. Eyebrows are thin.  Heart sounds are markedly distant.  There is atrophy of the left lower extremity.  Opposition to strength is fair in the upper extremities.  He has no range of motion in the left lower extremity.  General appearance: Adequately nourished; no acute distress, increased work of breathing is present.   Lymphatic: No lymphadenopathy about the head, neck, axilla. Eyes: No conjunctival inflammation or lid edema is present. There is no scleral icterus. Ears:  External ear exam shows no significant lesions or deformities.   Nose:  External nasal examination shows no deformity or inflammation. Nasal mucosa are pink and moist without lesions, exudates Oral exam: Lips and gums are healthy appearing.There is no oropharyngeal erythema or exudate. Neck:  No thyromegaly, masses, tenderness noted.    Heart:  No gallop, murmur, click, rub.  Lungs: Chest clear to auscultation without wheezes, rhonchi, rales, rubs. Abdomen: Bowel sounds are normal.  Abdomen is soft and nontender with no organomegaly, hernias, masses. GU: Deferred  Extremities:  No cyanosis, clubbing, edema. Neurologic exam:   Skin: Warm & dry w/o tenting. No significant lesions or  rash.  See clinical summary under each active problem in the Problem List with associated updated therapeutic plan

## 2020-10-29 NOTE — Assessment & Plan Note (Signed)
Diarrhea is actually overflow loose stool in the setting of fecal impaction.  MiraLAX twice daily as per GI will be continued.

## 2020-10-29 NOTE — Assessment & Plan Note (Addendum)
He describes acute gout beginning in November involving the right knee and ankle.  On 06/24/2020 uric acid level was 3.5. He does not drink alcohol.  I did discuss the impact high fructose corn syrup containing foods and drinks have on gout.

## 2020-10-29 NOTE — Assessment & Plan Note (Signed)
Pain medication regiment continued as per discharge instructions.

## 2020-11-01 ENCOUNTER — Non-Acute Institutional Stay (SKILLED_NURSING_FACILITY): Payer: Medicare Other | Admitting: Adult Health

## 2020-11-01 ENCOUNTER — Encounter: Payer: Self-pay | Admitting: Adult Health

## 2020-11-01 DIAGNOSIS — F419 Anxiety disorder, unspecified: Secondary | ICD-10-CM | POA: Diagnosis not present

## 2020-11-01 DIAGNOSIS — M25562 Pain in left knee: Secondary | ICD-10-CM | POA: Diagnosis not present

## 2020-11-01 DIAGNOSIS — E119 Type 2 diabetes mellitus without complications: Secondary | ICD-10-CM | POA: Diagnosis not present

## 2020-11-01 MED ORDER — HYDROCODONE-ACETAMINOPHEN 7.5-325 MG PO TABS
1.0000 | ORAL_TABLET | Freq: Every day | ORAL | 0 refills | Status: DC | PRN
Start: 2020-11-01 — End: 2020-11-05

## 2020-11-01 MED ORDER — DIAZEPAM 5 MG PO TABS
5.0000 mg | ORAL_TABLET | Freq: Three times a day (TID) | ORAL | 0 refills | Status: DC | PRN
Start: 2020-11-01 — End: 2020-11-07

## 2020-11-01 NOTE — Progress Notes (Signed)
Location:  [facility] Room Number: 213 A Place of Service:  SNF (31) Provider:  Durenda Age, DNP, FNP-BC  Patient Care Team: Timoteo Gaul, FNP as PCP - General (Family Medicine)  Extended Emergency Contact Information Primary Emergency Contact: [facility] Address: [address]          [place], [place] [address] Johnnette Litter of [place] Phone: [phone] Mobile Phone: [phone] Relation: Daughter  Code Status:  Full Code  Goals of care: Advanced Directive information Advanced Directives 10/17/2020  Does Patient Have a Medical Advance Directive? Yes  Type of Paramedic of [place];Living will  Does patient want to make changes to medical advance directive? No - Patient declined  Copy of [place] in Chart? No - copy requested  Would patient like information on creating a medical advance directive? No - Patient declined  Pre-existing out of facility DNR order (yellow form or pink MOST form) -      Chief Complaint  Patient presents with  . Acute Visit    Left knee pain, DM management and anxiety    HPI:  Pt is a 75 y.o. male who is a short-term care resident of [facility]. He complains of occasional left knee pain. He takes Acetaminophen 325 mg 2 tabs= 650 mg every 6 hours PRN and Norco 7.5-325 mg daily PRN for pain. He wears a leg brace on left lower leg. He stated that his left leg is paralyzed from his hips down due to history of polio. CBGs ranging from 97 to 170, with outliers 225 and high. He refused CBG check this morning. The latest hgbA1C 5.7, 06/22/20.    Past Medical History:  Diagnosis Date  . Arthritis   . History of prostate cancer s/p radiactive seed implants  03-24-2011  . Hypertension   . Mild acid reflux watches diet  . Polio age 47  . Scrotal abscess   . Weakness 05/2020  . 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 ANKLE RECONSTRUCTION  1957   BILATERAL  . ORCHIECTOMY  05/07/2012   Procedure: ORCHIECTOMY;  Surgeon: Bernestine Amass, MD;  Location: [facility];  Service: Urology;  Laterality: Right;  . RADIOACTIVE PROSTATE SEED IMPLANTS  04-21-2011    [facility]   PROSTATE CANCER  . SCROTAL EXPLORATION  05/07/2012   Procedure: SCROTUM EXPLORATION;  Surgeon: Bernestine Amass, MD;  Location: [facility];  Service: Urology;  Laterality: Right;  DRAINAGE OF ABSCESS  1 HR  [employer] MEDICARE    No Known Allergies  Outpatient Encounter Medications as of 11/01/2020  Medication Sig  . acetaminophen (TYLENOL) 325 MG tablet Take 2 tablets (650 mg total) by mouth every 6 (six) hours as needed for mild pain (or Fever >/= 101).  Marland Kitchen allopurinol (ZYLOPRIM) 300 MG tablet Take 300 mg by mouth 2 (two) times daily.  Marland Kitchen amLODipine (NORVASC) 5 MG tablet Take 1 tablet (5 mg total) by mouth daily.  . bisacodyl (DULCOLAX) 10 MG suppository Place 10 mg rectally as needed for moderate constipation.  . blood glucose meter kit and supplies KIT Dispense based on patient and insurance preference. Use up to four times daily as directed. (FOR ICD-9 250.00, 250.01). (Patient taking differently: Inject 1 each into the skin See admin instructions. Dispense based on patient and insurance preference. Use up to four times daily as  directed. (FOR ICD-9 250.00, 250.01).)  . diazepam (VALIUM) 5 MG tablet Take 1 tablet (5 mg total) by mouth every 8 (eight) hours as needed for anxiety.  . ferrous sulfate 325 (65 FE) MG tablet Take 1 tablet (325 mg total) by mouth daily with breakfast.  . HYDROcodone-acetaminophen (NORCO) 7.5-325 MG tablet Take 1 tablet by mouth daily as needed.  Marland Kitchen lisinopril (ZESTRIL) 20 MG tablet Take 1 tablet (20 mg total) by mouth daily.  . magnesium hydroxide (MILK OF MAGNESIA) 400 MG/5ML suspension Take 30 mLs by mouth daily as needed for mild  constipation.  . metoprolol succinate (TOPROL-XL) 100 MG 24 hr tablet Take 100 mg by mouth daily.  Marland Kitchen Nystatin ([name]'S BUTT CREAM) CREA Apply 1 application topically 2 (two) times daily.  Marland Kitchen omeprazole (PRILOSEC) 20 MG capsule Take 20 mg by mouth 2 (two) times daily.  . ondansetron (ZOFRAN) 4 MG tablet Take 1 tablet (4 mg total) by mouth every 6 (six) hours as needed for nausea.  . polyethylene glycol (MIRALAX / GLYCOLAX) 17 g packet Take 17 g by mouth 2 (two) times daily.  . rosuvastatin (CRESTOR) 5 MG tablet Take 5 mg by mouth every evening.   . senna (SENOKOT) 8.6 MG TABS tablet Take 1 tablet (8.6 mg total) by mouth 2 (two) times daily.  . vitamin B-12 (CYANOCOBALAMIN) 500 MCG tablet Take 1 tablet (500 mcg total) by mouth daily.  . Vitamin D, Ergocalciferol, (DRISDOL) 1.25 MG (50000 UNIT) CAPS capsule Take 50,000 Units by mouth once a week.  . [DISCONTINUED] diazepam (VALIUM) 5 MG tablet Take 5 mg by mouth every 8 (eight) hours as needed for anxiety.   No facility-administered encounter medications on file as of 11/01/2020.    Review of Systems  GENERAL: No change in appetite, no fatigue, no weight changes, no fever or chills  MOUTH and THROAT: Denies oral discomfort, gingival pain or bleeding RESPIRATORY: no cough, SOB, DOE, wheezing, hemoptysis CARDIAC: No chest pain, edema or palpitations GI: No abdominal pain, diarrhea, constipation, heart burn, nausea or vomiting GU: Denies dysuria, frequency, hematuria, incontinence, or discharge NEUROLOGICAL: Denies dizziness, syncope, numbness, or headache PSYCHIATRIC: +anxiety     There is no immunization history on file for this patient. Pertinent  Health Maintenance Due  Topic Date Due  . FOOT EXAM  Never done  . OPHTHALMOLOGY EXAM  Never done  . PNA vac Low Risk Adult (1 of 2 - PCV13) Never done  . HEMOGLOBIN A1C  12/20/2020  . INFLUENZA VACCINE  02/25/2021   No flowsheet data found.   Vitals:   11/01/20 1247  BP: 110/62   Pulse: 79  Resp: 18  Temp: (!) 97.5 F (36.4 C)  Weight: 149 lb (67.6 kg)  Height: _0  (1.676 m)   Body mass index is 24.05 kg/m.  Physical Exam  GENERAL APPEARANCE: Well nourished. In no acute distress. Normal body habitus SKIN:  Skin is warm and dry.  MOUTH and THROAT: Lips are without lesions. Oral mucosa is moist and without lesions.  RESPIRATORY: Breathing is even & unlabored, BS CTAB CARDIAC: RRR, no murmur,no extra heart sounds, no edema GI: Abdomen soft, normal BS, no masses, no tenderness NEUROLOGICAL: There is no tremor. Speech is clear. Alert and oriented X 3. PSYCHIATRIC:  Affect and behavior are appropriate  Labs reviewed: Recent Labs    10/20/20 0607 10/21/20 0504 10/23/20 0500  NA 134* 133* 133*  K 4.1 3.6 3.9  CL 106 103 104  CO2 21* 21* 20*  GLUCOSE 89 91 116*  BUN _0 CREATININE 0.76 0.94 1.14  CALCIUM 8.4* 8.9 8.6*  MG 1.8 2.1 1.8  PHOS 3.1 3.6 3.4   Recent Labs    10/20/20 0607 10/21/20 0504 10/23/20 0500  AST 9* 10* 10*  ALT _1 ALKPHOS 57 66 65  BILITOT 0.4 0.4 0.1*  PROT 5.2* 5.9* 5.7*  ALBUMIN 2.8* 3.1* 3.0*   Recent Labs    10/20/20 0607 10/21/20 0504 10/23/20 0500  WBC 7.5 9.0 11.4*  NEUTROABS 4.5 5.7 7.4  HGB 9.5* 10.8* 10.0*  HCT 29.2* 32.3* 31.6*  MCV 95.1 92.0 95.8  PLT 248 283 281   Lab Results  Component Value Date   TSH 1.993 06/22/2020   Lab Results  Component Value Date   HGBA1C 5.7 (H) 06/22/2020   Lab Results  Component Value Date   CHOL 97 06/12/2020   HDL 26 (L) 06/12/2020   LDLCALC 54 06/12/2020   TRIG 87 06/12/2020   CHOLHDL 3.7 06/12/2020    Significant Diagnostic Results in last 30 days:  DG Wrist 2 Views Left  Result Date: 10/20/2020 CLINICAL DATA:  Pain EXAM: LEFT WRIST - 2 VIEW COMPARISON:  None FINDINGS: Osseous demineralization. Markedly widened scapholunate interval consistent with torn scapholunate ligament. Ulnar minus variance. Mild degenerative changes at STT joint  and radiocarpal joint. No acute fracture, dislocation, or bone destruction. IMPRESSION: Scattered degenerative changes with torn scapholunate ligament and ulnar minus variance. No acute fracture or dislocation. Electronically Signed   By: Lavonia Dana M.D.   On: 10/20/2020 15:29   CT ABDOMEN PELVIS W CONTRAST  Result Date: 10/17/2020 CLINICAL DATA:  Diverticulitis, diarrhea, unspecified abdominal pain EXAM: CT ABDOMEN AND PELVIS WITH CONTRAST TECHNIQUE: Multidetector CT imaging of the abdomen and pelvis was performed using the standard protocol following bolus administration of intravenous contrast. CONTRAST:  123m OMNIPAQUE IOHEXOL 300 MG/ML  SOLN COMPARISON:  06/25/2020 FINDINGS: Lower chest: The visualized lung bases are clear bilaterally. The visualized heart and pericardium are unremarkable. Hepatobiliary: No focal liver abnormality is seen. No gallstones, gallbladder wall thickening, or biliary dilatation. Pancreas: Unremarkable Spleen: Unremarkable Adrenals/Urinary Tract: The adrenal glands are unremarkable. 2.8 cm simple cortical cyst noted within the upper pole the left kidney. The kidneys are otherwise unremarkable. The bladder is unremarkable. Stomach/Bowel: Large stool within the colon and particularly within the rectal vault with mild perirectal inflammatory stranding and rectal wall thickening possibly reflecting changes of stercoral proctitis. The stomach, small bowel, and large bowel are otherwise unremarkable. Appendix is unremarkable. No free intraperitoneal gas or fluid. Vascular/Lymphatic: Moderate atherosclerotic calcification within the abdominal aorta. No aortic aneurysm. No pathologic adenopathy within the abdomen and pelvis. Reproductive: Brachytherapy seeds are seen within the prostate gland, unchanged. Seminal vesicles are unremarkable. Other: Tiny fat containing umbilical hernia. Musculoskeletal: There is marked wasting of the left ileo psoas and gluteal musculature. There is wasting  of the visualized proximal thigh musculature bilaterally, left greater than right. No acute bone abnormality. IMPRESSION: Large stool within the distal colon and particularly within the rectal vault now with mild rectal wall thickening and perirectal inflammatory stranding suggesting changes of stercoral proctitis. Marked wasting of the lower extremity musculature, left greater than right, unchanged. Aortic Atherosclerosis (ICD10-I70.0). Electronically Signed   By: AFidela SalisburyMD   On: 10/17/2020 16:52   DG Hand 2 View Left  Result Date: 10/20/2020 CLINICAL DATA:  Pain EXAM: LEFT HAND - 2 VIEW COMPARISON:  None FINDINGS: Osseous demineralization. Joint space narrowing  at STT joint, third MCP joint, and scattered IP joints. No acute fracture, dislocation, or bone destruction. Three tiny radiopaque foreign bodies are seen within the palmar soft tissues of the LEFT hand. Widened scapholunate interval consistent with torn scapholunate ligament. IMPRESSION: Torn scapholunate ligament. Scattered degenerative changes greatest at STT joint and third MCP joint. No acute fracture or dislocation. Electronically Signed   By: Lavonia Dana M.D.   On: 10/20/2020 15:15    Assessment/Plan  1. Acute pain of left knee -  Continue PRN Acetaminophen and PRN Norco  2. Type 2 diabetes mellitus without complication, without long-term current use of insulin (HCC) Lab Results  Component Value Date   HGBA1C 5.7 (H) 06/22/2020   -   CBGs stable, will discontinue blood sugar checks  3. Anxiety -   Discussed that he can ask for Valium PRN - diazepam (VALIUM) 5 MG tablet; Take 1 tablet (5 mg total) by mouth every 8 (eight) hours as needed for anxiety.  Dispense: 30 tablet; Refill: 0     Family/ staff Communication:  Discussed plan of care with resident and charge nurse.  Labs/tests ordered:  None  Goals of care:   Short-term care   Durenda Age, DNP, MSN, FNP-BC [facility] [phone] (Monday-Friday 8:00 a.m. - 5:00 p.m.) [phone] (after hours)

## 2020-11-05 ENCOUNTER — Other Ambulatory Visit: Payer: Self-pay | Admitting: Adult Health

## 2020-11-05 MED ORDER — HYDROCODONE-ACETAMINOPHEN 7.5-325 MG PO TABS: 1.0000 | ORAL_TABLET | Freq: Every day | ORAL | 0 refills | Status: DC | PRN

## 2020-11-07 ENCOUNTER — Non-Acute Institutional Stay (SKILLED_NURSING_FACILITY): Payer: Medicare Other | Admitting: Adult Health

## 2020-11-07 ENCOUNTER — Encounter: Payer: Self-pay | Admitting: Adult Health

## 2020-11-07 DIAGNOSIS — R197 Diarrhea, unspecified: Secondary | ICD-10-CM | POA: Diagnosis not present

## 2020-11-07 DIAGNOSIS — D649 Anemia, unspecified: Secondary | ICD-10-CM

## 2020-11-07 DIAGNOSIS — M109 Gout, unspecified: Secondary | ICD-10-CM

## 2020-11-07 DIAGNOSIS — E119 Type 2 diabetes mellitus without complications: Secondary | ICD-10-CM | POA: Diagnosis not present

## 2020-11-07 DIAGNOSIS — I10 Essential (primary) hypertension: Secondary | ICD-10-CM

## 2020-11-07 DIAGNOSIS — E785 Hyperlipidemia, unspecified: Secondary | ICD-10-CM

## 2020-11-07 DIAGNOSIS — S638X2D Sprain of other part of left wrist and hand, subsequent encounter: Secondary | ICD-10-CM

## 2020-11-07 DIAGNOSIS — F419 Anxiety disorder, unspecified: Secondary | ICD-10-CM

## 2020-11-07 MED ORDER — DIAZEPAM 5 MG PO TABS
5.0000 mg | ORAL_TABLET | Freq: Three times a day (TID) | ORAL | 0 refills | Status: DC | PRN
Start: 2020-11-07 — End: 2021-12-20

## 2020-11-07 MED ORDER — METOPROLOL SUCCINATE ER 100 MG PO TB24: 100.0000 mg | ORAL_TABLET | Freq: Every day | ORAL | 0 refills | Status: DC

## 2020-11-07 MED ORDER — CYANOCOBALAMIN 500 MCG PO TABS: 500.0000 ug | ORAL_TABLET | Freq: Every day | ORAL | 0 refills | Status: DC

## 2020-11-07 MED ORDER — ALLOPURINOL 300 MG PO TABS: 300.0000 mg | ORAL_TABLET | Freq: Two times a day (BID) | ORAL | 0 refills | Status: DC

## 2020-11-07 MED ORDER — LISINOPRIL 20 MG PO TABS: 20.0000 mg | ORAL_TABLET | Freq: Every day | ORAL | 0 refills | Status: DC

## 2020-11-07 MED ORDER — VITAMIN D (ERGOCALCIFEROL) 1.25 MG (50000 UNIT) PO CAPS: 50000.0000 [IU] | ORAL_CAPSULE | ORAL | 0 refills | Status: DC

## 2020-11-07 MED ORDER — SENNA 8.6 MG PO TABS: 1.0000 | ORAL_TABLET | Freq: Two times a day (BID) | ORAL | 0 refills | Status: DC

## 2020-11-07 MED ORDER — AMLODIPINE BESYLATE 5 MG PO TABS: 5.0000 mg | ORAL_TABLET | Freq: Every day | ORAL | 0 refills | Status: DC

## 2020-11-07 MED ORDER — OMEPRAZOLE 20 MG PO CPDR
20.0000 mg | DELAYED_RELEASE_CAPSULE | Freq: Two times a day (BID) | ORAL | 0 refills | Status: DC
Start: 2020-11-07 — End: 2022-04-17

## 2020-11-07 MED ORDER — ROSUVASTATIN CALCIUM 5 MG PO TABS: 5.0000 mg | ORAL_TABLET | Freq: Every evening | ORAL | 0 refills | Status: DC

## 2020-11-07 MED ORDER — POLYETHYLENE GLYCOL 3350 17 G PO PACK: 17.0000 g | PACK | Freq: Two times a day (BID) | ORAL | 3 refills | Status: DC

## 2020-11-07 NOTE — Progress Notes (Signed)
Location:  [facility] Room Number: 213 A Place of Service:  SNF (31) Provider:  Durenda Age, DNP, FNP-BC  Patient Care Team: Timoteo Gaul, FNP as PCP - General (Family Medicine)  Extended Emergency Contact Information Primary Emergency Contact: [facility] Address: [address]          [place],  [address] Johnnette Litter of [place] Phone: [phone] Mobile Phone: [phone] Relation: Daughter  Code Status:    Goals of care: Advanced Directive information Advanced Directives 10/17/2020  Does Patient Have a Medical Advance Directive? Yes  Type of Paramedic of [place];Living will  Does patient want to make changes to medical advance directive? No - Patient declined  Copy of [place] in Chart? No - copy requested  Would patient like information on creating a medical advance directive? No - Patient declined  Pre-existing out of facility DNR order (yellow form or pink MOST form) -     Chief Complaint  Patient presents with  . Discharge Note    For discharge home on 11/07/20    HPI:  Pt is a 75 y.o. male who is for discharge home on 11/07/20 with Home health PT and OT.  He was admitted to [facility] on 10/23/20 post hospitalization 10/18/2018 to 10/24/2018.  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  He had a history of 2-1/2 week continues diarrhea.  There was no reported mucus or blood in the stool.  He used Imodium and Pepto-Bismol with minimal transient relief.  He had abdominal cramping intermittently.  CT scan showed some distal proctitis with a large stool burden.  Patient was given a dose of Zosyn in ER for antibiotic coverage.  Gastroenterology was consulted and his electrolytes were repleted -K2.7 and magnesium 1.6.  GI did a  partial digital disimpaction and ordered mineral oil retention enema, as well as increased his laxatives.  His renal function slightly went up and was given 500 mL IV bolus.  Patient was admitted to this facility for short-term rehabilitation after the patient's recent hospitalization.  Patient has completed SNF rehabilitation and therapy has cleared the patient for discharge.   Past Medical History:  Diagnosis Date  . Arthritis   . History of prostate cancer s/p radiactive seed implants  03-24-2011  . Hypertension   . Mild acid reflux watches diet  . Polio age 85  . Scrotal abscess   . Weakness 05/2020  . 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 ANKLE RECONSTRUCTION  1957   BILATERAL  . ORCHIECTOMY  05/07/2012   Procedure: ORCHIECTOMY;  Surgeon: Bernestine Amass, MD;  Location: [facility];  Service: Urology;  Laterality: Right;  . RADIOACTIVE PROSTATE SEED IMPLANTS  04-21-2011    DR [facility]   PROSTATE CANCER  . SCROTAL EXPLORATION  05/07/2012   Procedure: SCROTUM EXPLORATION;  Surgeon: Bernestine Amass, MD;  Location: [facility];  Service: Urology;  Laterality: Right;  DRAINAGE OF ABSCESS  1 HR  [employer] MEDICARE    No Known Allergies  Outpatient Encounter Medications as of 11/07/2020  Medication Sig  . acetaminophen (TYLENOL) 325 MG tablet Take 2 tablets (650 mg total) by mouth every 6 (six) hours as needed for mild pain (or Fever >/= 101).  Marland Kitchen allopurinol (  ZYLOPRIM) 300 MG tablet Take 1 tablet (300 mg total) by mouth 2 (two) times daily.  Marland Kitchen amLODipine (NORVASC) 5 MG tablet Take 1 tablet (5 mg total) by mouth daily.  . bisacodyl (DULCOLAX) 10 MG suppository Place 10 mg rectally as needed for moderate constipation.  . blood glucose meter kit and supplies KIT Dispense based on patient and insurance preference. Use up to four times daily as directed. (FOR ICD-9 250.00, 250.01). (Patient  taking differently: Inject 1 each into the skin See admin instructions. Dispense based on patient and insurance preference. Use up to four times daily as directed. (FOR ICD-9 250.00, 250.01).)  . diazepam (VALIUM) 5 MG tablet Take 1 tablet (5 mg total) by mouth every 8 (eight) hours as needed for anxiety.  Marland Kitchen HYDROcodone-acetaminophen (NORCO) 7.5-325 MG tablet Take 1 tablet by mouth daily as needed.  Marland Kitchen lisinopril (ZESTRIL) 20 MG tablet Take 1 tablet (20 mg total) by mouth daily.  . magnesium hydroxide (MILK OF MAGNESIA) 400 MG/5ML suspension Take 30 mLs by mouth daily as needed for mild constipation.  . metoprolol succinate (TOPROL-XL) 100 MG 24 hr tablet Take 1 tablet (100 mg total) by mouth daily.  Marland Kitchen Nystatin ([name]'S BUTT CREAM) CREA Apply 1 application topically 2 (two) times daily.  Marland Kitchen omeprazole (PRILOSEC) 20 MG capsule Take 1 capsule (20 mg total) by mouth 2 (two) times daily.  . polyethylene glycol (MIRALAX / GLYCOLAX) 17 g packet Take 17 g by mouth 2 (two) times daily.  . rosuvastatin (CRESTOR) 5 MG tablet Take 1 tablet (5 mg total) by mouth every evening.  . senna (SENOKOT) 8.6 MG TABS tablet Take 1 tablet (8.6 mg total) by mouth 2 (two) times daily.  . vitamin B-12 (CYANOCOBALAMIN) 500 MCG tablet Take 1 tablet (500 mcg total) by mouth daily.  . Vitamin D, Ergocalciferol, (DRISDOL) 1.25 MG (50000 UNIT) CAPS capsule Take 1 capsule (50,000 Units total) by mouth once a week.  . [DISCONTINUED] allopurinol (ZYLOPRIM) 300 MG tablet Take 300 mg by mouth 2 (two) times daily.  . [DISCONTINUED] amLODipine (NORVASC) 5 MG tablet Take 1 tablet (5 mg total) by mouth daily.  . [DISCONTINUED] diazepam (VALIUM) 5 MG tablet Take 1 tablet (5 mg total) by mouth every 8 (eight) hours as needed for anxiety.  . [DISCONTINUED] ferrous sulfate 325 (65 FE) MG tablet Take 1 tablet (325 mg total) by mouth daily with breakfast.  . [DISCONTINUED] lisinopril (ZESTRIL) 20 MG tablet Take 1 tablet (20 mg total) by mouth  daily.  . [DISCONTINUED] metoprolol succinate (TOPROL-XL) 100 MG 24 hr tablet Take 100 mg by mouth daily.  . [DISCONTINUED] omeprazole (PRILOSEC) 20 MG capsule Take 20 mg by mouth 2 (two) times daily.  . [DISCONTINUED] ondansetron (ZOFRAN) 4 MG tablet Take 1 tablet (4 mg total) by mouth every 6 (six) hours as needed for nausea.  . [DISCONTINUED] polyethylene glycol (MIRALAX / GLYCOLAX) 17 g packet Take 17 g by mouth 2 (two) times daily.  . [DISCONTINUED] rosuvastatin (CRESTOR) 5 MG tablet Take 5 mg by mouth every evening.   . [DISCONTINUED] senna (SENOKOT) 8.6 MG TABS tablet Take 1 tablet (8.6 mg total) by mouth 2 (two) times daily.  . [DISCONTINUED] vitamin B-12 (CYANOCOBALAMIN) 500 MCG tablet Take 1 tablet (500 mcg total) by mouth daily.  . [DISCONTINUED] Vitamin D, Ergocalciferol, (DRISDOL) 1.25 MG (50000 UNIT) CAPS capsule Take 50,000 Units by mouth once a week.   No facility-administered encounter medications on file as of 11/07/2020.    Review of Systems  GENERAL: No change in appetite, no fatigue, no weight changes, no fever, chills MOUTH and THROAT: Denies oral discomfort, gingival pain or bleeding RESPIRATORY: no cough, SOB, DOE, wheezing, hemoptysis CARDIAC: No chest pain, edema or palpitations GI: No abdominal pain, diarrhea, constipation, heart burn, nausea or vomiting GU: Denies dysuria, frequency, hematuria or discharge NEUROLOGICAL: Denies dizziness, syncope or headache PSYCHIATRIC: Denies feelings of depression or anxiety. No report of hallucinations, insomnia, paranoia, or agitation   There is no immunization history on file for this patient. Pertinent  Health Maintenance Due  Topic Date Due  . FOOT EXAM  Never done  . OPHTHALMOLOGY EXAM  Never done  . PNA vac Low Risk Adult (1 of 2 - PCV13) Never done  . HEMOGLOBIN A1C  12/20/2020  . INFLUENZA VACCINE  02/25/2021   No flowsheet data found.   Vitals:   11/07/20 1617  BP: 118/69  Pulse: 81  Resp: 16  Temp: (!)  96.9 F (36.1 C)  Weight: 153 lb 9.6 oz (69.7 kg)  Height: 5' 6"  (1.676 m)   Body mass index is 24.79 kg/m.  Physical Exam  GENERAL APPEARANCE: Well nourished. In no acute distress. Normal body habitus SKIN:  Skin is warm and dry.  MOUTH and THROAT: Lips are without lesions. Oral mucosa is moist and without lesions.  RESPIRATORY: Breathing is even & unlabored, BS CTAB CARDIAC: RRR, no murmur,no extra heart sounds, no edema GI: Abdomen soft, normal BS, no masses, no tenderness NEUROLOGICAL: There is no tremor. Speech is clear.Alert and oriented X 3. Left leg with brace. PSYCHIATRIC:  Affect and behavior are appropriate  Labs reviewed: Recent Labs    10/20/20 0607 10/21/20 0504 10/23/20 0500  NA 134* 133* 133*  K 4.1 3.6 3.9  CL 106 103 104  CO2 21* 21* 20*  GLUCOSE 89 91 116*  BUN 11 10 18   CREATININE 0.76 0.94 1.14  CALCIUM 8.4* 8.9 8.6*  MG 1.8 2.1 1.8  PHOS 3.1 3.6 3.4   Recent Labs    10/20/20 0607 10/21/20 0504 10/23/20 0500  AST 9* 10* 10*  ALT 17 20 23   ALKPHOS 57 66 65  BILITOT 0.4 0.4 0.1*  PROT 5.2* 5.9* 5.7*  ALBUMIN 2.8* 3.1* 3.0*   Recent Labs    10/20/20 0607 10/21/20 0504 10/23/20 0500  WBC 7.5 9.0 11.4*  NEUTROABS 4.5 5.7 7.4  HGB 9.5* 10.8* 10.0*  HCT 29.2* 32.3* 31.6*  MCV 95.1 92.0 95.8  PLT 248 283 281   Lab Results  Component Value Date   TSH 1.993 06/22/2020   Lab Results  Component Value Date   HGBA1C 5.7 (H) 06/22/2020   Lab Results  Component Value Date   CHOL 97 06/12/2020   HDL 26 (L) 06/12/2020   LDLCALC 54 06/12/2020   TRIG 87 06/12/2020   CHOLHDL 3.7 06/12/2020    Significant Diagnostic Results in last 30 days:  DG Wrist 2 Views Left  Result Date: 10/20/2020 CLINICAL DATA:  Pain EXAM: LEFT WRIST - 2 VIEW COMPARISON:  None FINDINGS: Osseous demineralization. Markedly widened scapholunate interval consistent with torn scapholunate ligament. Ulnar minus variance. Mild degenerative changes at STT joint and  radiocarpal joint. No acute fracture, dislocation, or bone destruction. IMPRESSION: Scattered degenerative changes with torn scapholunate ligament and ulnar minus variance. No acute fracture or dislocation. Electronically Signed   By: Lavonia Dana M.D.   On: 10/20/2020 15:29   CT ABDOMEN PELVIS W CONTRAST  Result Date: 10/17/2020 CLINICAL DATA:  Diverticulitis, diarrhea, unspecified  abdominal pain EXAM: CT ABDOMEN AND PELVIS WITH CONTRAST TECHNIQUE: Multidetector CT imaging of the abdomen and pelvis was performed using the standard protocol following bolus administration of intravenous contrast. CONTRAST:  153m OMNIPAQUE IOHEXOL 300 MG/ML  SOLN COMPARISON:  06/25/2020 FINDINGS: Lower chest: The visualized lung bases are clear bilaterally. The visualized heart and pericardium are unremarkable. Hepatobiliary: No focal liver abnormality is seen. No gallstones, gallbladder wall thickening, or biliary dilatation. Pancreas: Unremarkable Spleen: Unremarkable Adrenals/Urinary Tract: The adrenal glands are unremarkable. 2.8 cm simple cortical cyst noted within the upper pole the left kidney. The kidneys are otherwise unremarkable. The bladder is unremarkable. Stomach/Bowel: Large stool within the colon and particularly within the rectal vault with mild perirectal inflammatory stranding and rectal wall thickening possibly reflecting changes of stercoral proctitis. The stomach, small bowel, and large bowel are otherwise unremarkable. Appendix is unremarkable. No free intraperitoneal gas or fluid. Vascular/Lymphatic: Moderate atherosclerotic calcification within the abdominal aorta. No aortic aneurysm. No pathologic adenopathy within the abdomen and pelvis. Reproductive: Brachytherapy seeds are seen within the prostate gland, unchanged. Seminal vesicles are unremarkable. Other: Tiny fat containing umbilical hernia. Musculoskeletal: There is marked wasting of the left ileo psoas and gluteal musculature. There is wasting of  the visualized proximal thigh musculature bilaterally, left greater than right. No acute bone abnormality. IMPRESSION: Large stool within the distal colon and particularly within the rectal vault now with mild rectal wall thickening and perirectal inflammatory stranding suggesting changes of stercoral proctitis. Marked wasting of the lower extremity musculature, left greater than right, unchanged. Aortic Atherosclerosis (ICD10-I70.0). Electronically Signed   By: AFidela SalisburyMD   On: 10/17/2020 16:52   DG Hand 2 View Left  Result Date: 10/20/2020 CLINICAL DATA:  Pain EXAM: LEFT HAND - 2 VIEW COMPARISON:  None FINDINGS: Osseous demineralization. Joint space narrowing at STT joint, third MCP joint, and scattered IP joints. No acute fracture, dislocation, or bone destruction. Three tiny radiopaque foreign bodies are seen within the palmar soft tissues of the LEFT hand. Widened scapholunate interval consistent with torn scapholunate ligament. IMPRESSION: Torn scapholunate ligament. Scattered degenerative changes greatest at STT joint and third MCP joint. No acute fracture or dislocation. Electronically Signed   By: MLavonia DanaM.D.   On: 10/20/2020 15:15    Assessment/Plan   1. Diarrhea, unspecified type -   In the setting of fecal impaction -  Follow-up with GI  - senna (SENOKOT) 8.6 MG TABS tablet; Take 1 tablet (8.6 mg total) by mouth 2 (two) times daily.  Dispense: 60 tablet; Refill: 0 - polyethylene glycol (MIRALAX / GLYCOLAX) 17 g packet; Take 17 g by mouth 2 (two) times daily.  Dispense: 14 each; Refill: 3   2. Tear of left scapholunate ligament, subsequent encounter -   Wrist x-ray showed scattered degenerative changes with torn scapholunate ligament and ulnar minus variance.  No acute fracture or dislocation -   Hand x-ray showed torn scapholunate ligament, scattered degenerative changes greatest at STT joint and third MCP joint.  No acute fracture or dislocation -Both hands surgery and  orthopedic surgery recommended a wrist splint and to follow-up as outpatient -Continue hydrocodone-acetaminophen 7.11-2023 mg 1 tab daily  PRN for pain  3. Essential hypertension - lisinopril (ZESTRIL) 20 MG tablet; Take 1 tablet (20 mg total) by mouth daily.  Dispense: 30 tablet; Refill: 0 - metoprolol succinate (TOPROL-XL) 100 MG 24 hr tablet; Take 1 tablet (100 mg total) by mouth daily.  Dispense: 30 tablet; Refill: 0 - amLODipine (NORVASC) 5 MG  tablet; Take 1 tablet (5 mg total) by mouth daily.  Dispense: 30 tablet; Refill: 0  4. Anemia, unspecified type -  hgb 11.7, 10/30/20, improved -  Discontinue FeSO4  5. Gouty arthritis - allopurinol (ZYLOPRIM) 300 MG tablet; Take 1 tablet (300 mg total) by mouth 2 (two) times daily.  Dispense: 60 tablet; Refill: 0  6. Anxiety - diazepam (VALIUM) 5 MG tablet; Take 1 tablet (5 mg total) by mouth every 8 (eight) hours as needed for anxiety.  Dispense: 30 tablet; Refill: 0  7. Hyperlipidemia, unspecified hyperlipidemia type - rosuvastatin (CRESTOR) 5 MG tablet; Take 1 tablet (5 mg total) by mouth every evening.  Dispense: 30 tablet; Refill: 0    I have filled out patient's discharge paperwork and e-prescribed medications.  Patient will receive home health PT and OT.  DME provided:  3-in-1  Total discharge time: Greater than 30 minutes Greater than 50% was spent in counseling and coordination of care.   Discharge time involved coordination of the discharge process with social worker, nursing staff and therapy department. Medical justification for home health services/DME verified.   Durenda Age, DNP, MSN, FNP-BC [facility] [phone] (Monday-Friday 8:00 a.m. - 5:00 p.m.) [phone] (after hours)

## 2020-11-09 NOTE — Progress Notes (Incomplete)
Location:  [facility] Room Number: 213 A Place of Service:  SNF (31) Provider:  Durenda Age, DNP, FNP-BC  Patient Care Team: Timoteo Gaul, FNP as PCP - General (Family Medicine)  Extended Emergency Contact Information Primary Emergency Contact: [facility] Address: [address]          [place], [place] [address] Johnnette Litter of [place] Phone: [phone] Mobile Phone: [phone] Relation: Daughter  Code Status:    Goals of care: Advanced Directive information Advanced Directives 10/17/2020  Does Patient Have a Medical Advance Directive? Yes  Type of Paramedic of [place];Living will  Does patient want to make changes to medical advance directive? No - Patient declined  Copy of [place] in Chart? No - copy requested  Would patient like information on creating a medical advance directive? No - Patient declined  Pre-existing out of facility DNR order (yellow form or pink MOST form) -     Chief Complaint  Patient presents with  . Discharge Note    For discharge home on 11/07/20    HPI:  Pt is a 75 y.o. male who is for discharge home on 11/07/20 with Home health PT and OT.  He was admitted to [facility] on 10/23/20 post hospitalization 10/18/2018 to 10/24/2018.  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  He had a history of 2-1/2 week continues diarrhea.  There was no reported mucus or blood in the stool.  He used Imodium and Pepto-Bismol with minimal transient relief.  He had abdominal cramping intermittently.  CT scan showed some distal proctitis with a large stool burden.  Patient was given a dose of Zosyn in ER for antibiotic coverage.  Gastroenterology was consulted and his electrolytes were repleted -K2.7 and magnesium 1.6.  GI did a  partial digital disimpaction and ordered mineral oil retention enema, as well as increased his laxatives.  His renal function slightly went up and was given 500 mL IV bolus.  Patient was admitted to this facility for short-term rehabilitation after the patient's recent hospitalization.  Patient has completed SNF rehabilitation and therapy has cleared the patient for discharge.   Past Medical History:  Diagnosis Date  . Arthritis   . History of prostate cancer s/p radiactive seed implants  03-24-2011  . Hypertension   . Mild acid reflux watches diet  . Polio age 89  . Scrotal abscess   . Weakness 05/2020  . 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 ANKLE RECONSTRUCTION  1957   BILATERAL  . ORCHIECTOMY  05/07/2012   Procedure: ORCHIECTOMY;  Surgeon: Bernestine Amass, MD;  Location: [facility];  Service: Urology;  Laterality: Right;  . RADIOACTIVE PROSTATE SEED IMPLANTS  04-21-2011    DR [facility]   PROSTATE CANCER  . SCROTAL EXPLORATION  05/07/2012   Procedure: SCROTUM EXPLORATION;  Surgeon: Bernestine Amass, MD;  Location: [facility];  Service: Urology;  Laterality: Right;  DRAINAGE OF ABSCESS  1 HR  UHC MEDICARE    No Known Allergies  Outpatient Encounter Medications as of 11/07/2020  Medication Sig  . acetaminophen (TYLENOL) 325 MG tablet Take 2 tablets (650 mg total) by mouth every 6 (six) hours as needed for mild pain (or Fever >/= 101).  Marland Kitchen allopurinol (  ZYLOPRIM) 300 MG tablet Take 1 tablet (300 mg total) by mouth 2 (two) times daily.  Marland Kitchen amLODipine (NORVASC) 5 MG tablet Take 1 tablet (5 mg total) by mouth daily.  . bisacodyl (DULCOLAX) 10 MG suppository Place 10 mg rectally as needed for moderate constipation.  . blood glucose meter kit and supplies KIT Dispense based on patient and insurance preference. Use up to four times daily as directed. (FOR ICD-9 250.00, 250.01). (Patient  taking differently: Inject 1 each into the skin See admin instructions. Dispense based on patient and insurance preference. Use up to four times daily as directed. (FOR ICD-9 250.00, 250.01).)  . diazepam (VALIUM) 5 MG tablet Take 1 tablet (5 mg total) by mouth every 8 (eight) hours as needed for anxiety.  Marland Kitchen HYDROcodone-acetaminophen (NORCO) 7.5-325 MG tablet Take 1 tablet by mouth daily as needed.  Marland Kitchen lisinopril (ZESTRIL) 20 MG tablet Take 1 tablet (20 mg total) by mouth daily.  . magnesium hydroxide (MILK OF MAGNESIA) 400 MG/5ML suspension Take 30 mLs by mouth daily as needed for mild constipation.  . metoprolol succinate (TOPROL-XL) 100 MG 24 hr tablet Take 1 tablet (100 mg total) by mouth daily.  Marland Kitchen Nystatin ([name]'S BUTT CREAM) CREA Apply 1 application topically 2 (two) times daily.  Marland Kitchen omeprazole (PRILOSEC) 20 MG capsule Take 1 capsule (20 mg total) by mouth 2 (two) times daily.  . polyethylene glycol (MIRALAX / GLYCOLAX) 17 g packet Take 17 g by mouth 2 (two) times daily.  . rosuvastatin (CRESTOR) 5 MG tablet Take 1 tablet (5 mg total) by mouth every evening.  . senna (SENOKOT) 8.6 MG TABS tablet Take 1 tablet (8.6 mg total) by mouth 2 (two) times daily.  . vitamin B-12 (CYANOCOBALAMIN) 500 MCG tablet Take 1 tablet (500 mcg total) by mouth daily.  . Vitamin D, Ergocalciferol, (DRISDOL) 1.25 MG (50000 UNIT) CAPS capsule Take 1 capsule (50,000 Units total) by mouth once a week.  . [DISCONTINUED] allopurinol (ZYLOPRIM) 300 MG tablet Take 300 mg by mouth 2 (two) times daily.  . [DISCONTINUED] amLODipine (NORVASC) 5 MG tablet Take 1 tablet (5 mg total) by mouth daily.  . [DISCONTINUED] diazepam (VALIUM) 5 MG tablet Take 1 tablet (5 mg total) by mouth every 8 (eight) hours as needed for anxiety.  . [DISCONTINUED] ferrous sulfate 325 (65 FE) MG tablet Take 1 tablet (325 mg total) by mouth daily with breakfast.  . [DISCONTINUED] lisinopril (ZESTRIL) 20 MG tablet Take 1 tablet (20 mg total) by mouth  daily.  . [DISCONTINUED] metoprolol succinate (TOPROL-XL) 100 MG 24 hr tablet Take 100 mg by mouth daily.  . [DISCONTINUED] omeprazole (PRILOSEC) 20 MG capsule Take 20 mg by mouth 2 (two) times daily.  . [DISCONTINUED] ondansetron (ZOFRAN) 4 MG tablet Take 1 tablet (4 mg total) by mouth every 6 (six) hours as needed for nausea.  . [DISCONTINUED] polyethylene glycol (MIRALAX / GLYCOLAX) 17 g packet Take 17 g by mouth 2 (two) times daily.  . [DISCONTINUED] rosuvastatin (CRESTOR) 5 MG tablet Take 5 mg by mouth every evening.   . [DISCONTINUED] senna (SENOKOT) 8.6 MG TABS tablet Take 1 tablet (8.6 mg total) by mouth 2 (two) times daily.  . [DISCONTINUED] vitamin B-12 (CYANOCOBALAMIN) 500 MCG tablet Take 1 tablet (500 mcg total) by mouth daily.  . [DISCONTINUED] Vitamin D, Ergocalciferol, (DRISDOL) 1.25 MG (50000 UNIT) CAPS capsule Take 50,000 Units by mouth once a week.   No facility-administered encounter medications on file as of 11/07/2020.    Review of Systems  GENERAL: No change in appetite, no fatigue, no weight changes, no fever, chills MOUTH and THROAT: Denies oral discomfort, gingival pain or bleeding RESPIRATORY: no cough, SOB, DOE, wheezing, hemoptysis CARDIAC: No chest pain, edema or palpitations GI: No abdominal pain, diarrhea, constipation, heart burn, nausea or vomiting GU: Denies dysuria, frequency, hematuria or discharge NEUROLOGICAL: Denies dizziness, syncope or headache PSYCHIATRIC: Denies feelings of depression or anxiety. No report of hallucinations, insomnia, paranoia, or agitation   There is no immunization history on file for this patient. Pertinent  Health Maintenance Due  Topic Date Due  . FOOT EXAM  Never done  . OPHTHALMOLOGY EXAM  Never done  . PNA vac Low Risk Adult (1 of 2 - PCV13) Never done  . HEMOGLOBIN A1C  12/20/2020  . INFLUENZA VACCINE  02/25/2021   No flowsheet data found.   Vitals:   11/07/20 1617  BP: 118/69  Pulse: 81  Resp: 16  Temp: (!)  96.9 F (36.1 C)  Weight: 153 lb 9.6 oz (69.7 kg)  Height: 5' 6"  (1.676 m)   Body mass index is 24.79 kg/m.  Physical Exam  GENERAL APPEARANCE: Well nourished. In no acute distress. Normal body habitus SKIN:  Skin is warm and dry.  MOUTH and THROAT: Lips are without lesions. Oral mucosa is moist and without lesions.  RESPIRATORY: Breathing is even & unlabored, BS CTAB CARDIAC: RRR, no murmur,no extra heart sounds, no edema GI: Abdomen soft, normal BS, no masses, no tenderness NEUROLOGICAL: There is no tremor. Speech is clear.Alert and oriented X 3.  PSYCHIATRIC:  Affect and behavior are appropriate  Labs reviewed: Recent Labs    10/20/20 0607 10/21/20 0504 10/23/20 0500  NA 134* 133* 133*  K 4.1 3.6 3.9  CL 106 103 104  CO2 21* 21* 20*  GLUCOSE 89 91 116*  BUN 11 10 18   CREATININE 0.76 0.94 1.14  CALCIUM 8.4* 8.9 8.6*  MG 1.8 2.1 1.8  PHOS 3.1 3.6 3.4   Recent Labs    10/20/20 0607 10/21/20 0504 10/23/20 0500  AST 9* 10* 10*  ALT 17 20 23   ALKPHOS 57 66 65  BILITOT 0.4 0.4 0.1*  PROT 5.2* 5.9* 5.7*  ALBUMIN 2.8* 3.1* 3.0*   Recent Labs    10/20/20 0607 10/21/20 0504 10/23/20 0500  WBC 7.5 9.0 11.4*  NEUTROABS 4.5 5.7 7.4  HGB 9.5* 10.8* 10.0*  HCT 29.2* 32.3* 31.6*  MCV 95.1 92.0 95.8  PLT 248 283 281   Lab Results  Component Value Date   TSH 1.993 06/22/2020   Lab Results  Component Value Date   HGBA1C 5.7 (H) 06/22/2020   Lab Results  Component Value Date   CHOL 97 06/12/2020   HDL 26 (L) 06/12/2020   LDLCALC 54 06/12/2020   TRIG 87 06/12/2020   CHOLHDL 3.7 06/12/2020    Significant Diagnostic Results in last 30 days:  DG Wrist 2 Views Left  Result Date: 10/20/2020 CLINICAL DATA:  Pain EXAM: LEFT WRIST - 2 VIEW COMPARISON:  None FINDINGS: Osseous demineralization. Markedly widened scapholunate interval consistent with torn scapholunate ligament. Ulnar minus variance. Mild degenerative changes at STT joint and radiocarpal joint. No  acute fracture, dislocation, or bone destruction. IMPRESSION: Scattered degenerative changes with torn scapholunate ligament and ulnar minus variance. No acute fracture or dislocation. Electronically Signed   By: Lavonia Dana M.D.   On: 10/20/2020 15:29   CT ABDOMEN PELVIS W CONTRAST  Result Date: 10/17/2020 CLINICAL DATA:  Diverticulitis, diarrhea, unspecified abdominal pain EXAM:  CT ABDOMEN AND PELVIS WITH CONTRAST TECHNIQUE: Multidetector CT imaging of the abdomen and pelvis was performed using the standard protocol following bolus administration of intravenous contrast. CONTRAST:  158m OMNIPAQUE IOHEXOL 300 MG/ML  SOLN COMPARISON:  06/25/2020 FINDINGS: Lower chest: The visualized lung bases are clear bilaterally. The visualized heart and pericardium are unremarkable. Hepatobiliary: No focal liver abnormality is seen. No gallstones, gallbladder wall thickening, or biliary dilatation. Pancreas: Unremarkable Spleen: Unremarkable Adrenals/Urinary Tract: The adrenal glands are unremarkable. 2.8 cm simple cortical cyst noted within the upper pole the left kidney. The kidneys are otherwise unremarkable. The bladder is unremarkable. Stomach/Bowel: Large stool within the colon and particularly within the rectal vault with mild perirectal inflammatory stranding and rectal wall thickening possibly reflecting changes of stercoral proctitis. The stomach, small bowel, and large bowel are otherwise unremarkable. Appendix is unremarkable. No free intraperitoneal gas or fluid. Vascular/Lymphatic: Moderate atherosclerotic calcification within the abdominal aorta. No aortic aneurysm. No pathologic adenopathy within the abdomen and pelvis. Reproductive: Brachytherapy seeds are seen within the prostate gland, unchanged. Seminal vesicles are unremarkable. Other: Tiny fat containing umbilical hernia. Musculoskeletal: There is marked wasting of the left ileo psoas and gluteal musculature. There is wasting of the visualized proximal  thigh musculature bilaterally, left greater than right. No acute bone abnormality. IMPRESSION: Large stool within the distal colon and particularly within the rectal vault now with mild rectal wall thickening and perirectal inflammatory stranding suggesting changes of stercoral proctitis. Marked wasting of the lower extremity musculature, left greater than right, unchanged. Aortic Atherosclerosis (ICD10-I70.0). Electronically Signed   By: AFidela SalisburyMD   On: 10/17/2020 16:52   DG Hand 2 View Left  Result Date: 10/20/2020 CLINICAL DATA:  Pain EXAM: LEFT HAND - 2 VIEW COMPARISON:  None FINDINGS: Osseous demineralization. Joint space narrowing at STT joint, third MCP joint, and scattered IP joints. No acute fracture, dislocation, or bone destruction. Three tiny radiopaque foreign bodies are seen within the palmar soft tissues of the LEFT hand. Widened scapholunate interval consistent with torn scapholunate ligament. IMPRESSION: Torn scapholunate ligament. Scattered degenerative changes greatest at STT joint and third MCP joint. No acute fracture or dislocation. Electronically Signed   By: MLavonia DanaM.D.   On: 10/20/2020 15:15    Assessment/Plan     I have filled out patient's discharge paperwork and e-prescribed medications.  Patient will receive home health PT and OT.  DME provided:  3-in-1  Total discharge time: Greater than 30 minutes Greater than 50% was spent in counseling and coordination of care.   Discharge time involved coordination of the discharge process with social worker, nursing staff and therapy department. Medical justification for home health services/DME verified.   MDurenda Age DNP, MSN, FNP-BC [facility] [phone](Monday-Friday 8:00 a.m. - 5:00 p.m.) [phone](after hours)

## 2020-11-10 MED ORDER — HYDROCODONE-ACETAMINOPHEN 7.5-325 MG PO TABS: 1.0000 | ORAL_TABLET | Freq: Every day | ORAL | 0 refills | Status: DC | PRN

## 2020-12-03 ENCOUNTER — Other Ambulatory Visit: Payer: Self-pay | Admitting: Adult Health

## 2020-12-03 DIAGNOSIS — I10 Essential (primary) hypertension: Secondary | ICD-10-CM

## 2020-12-18 ENCOUNTER — Other Ambulatory Visit: Payer: Self-pay | Admitting: Adult Health

## 2020-12-18 DIAGNOSIS — I10 Essential (primary) hypertension: Secondary | ICD-10-CM

## 2021-02-07 ENCOUNTER — Other Ambulatory Visit: Payer: Self-pay | Admitting: Adult Health

## 2021-02-07 DIAGNOSIS — E785 Hyperlipidemia, unspecified: Secondary | ICD-10-CM

## 2021-10-18 ENCOUNTER — Ambulatory Visit: Payer: Medicare Other | Admitting: Family Medicine

## 2021-10-25 ENCOUNTER — Ambulatory Visit: Payer: Medicare Other | Admitting: Family Medicine

## 2021-11-01 ENCOUNTER — Ambulatory Visit: Payer: Medicare Other | Admitting: Family Medicine

## 2021-11-08 ENCOUNTER — Ambulatory Visit: Payer: Medicare Other | Admitting: Family Medicine

## 2021-11-13 ENCOUNTER — Other Ambulatory Visit: Payer: Self-pay | Admitting: Adult Health

## 2021-11-20 ENCOUNTER — Telehealth: Payer: Self-pay

## 2021-11-20 NOTE — Telephone Encounter (Signed)
This pt is scheduled for a New Patient visit at 4:40 pm on Friday 11/22/21, are we able to get him rescheduled to earlier that day? Vickie is trying to reserve her 4:40 spots for VV only. Thanks!! ?

## 2021-11-20 NOTE — Telephone Encounter (Signed)
Daughter returning call, the earliest she could r/s was 340 ? ?Pts appt r/s ? ? ?

## 2021-11-20 NOTE — Telephone Encounter (Signed)
Thank you :)

## 2021-11-22 ENCOUNTER — Ambulatory Visit: Payer: Medicare Other | Admitting: Family Medicine

## 2021-12-13 ENCOUNTER — Ambulatory Visit: Payer: Medicare Other | Admitting: Family Medicine

## 2021-12-16 ENCOUNTER — Inpatient Hospital Stay (HOSPITAL_COMMUNITY)
Admission: EM | Admit: 2021-12-16 | Discharge: 2021-12-20 | DRG: 871 | Disposition: A | Discharge status: Home / Self Care | Payer: Medicare Other | Attending: Internal Medicine | Admitting: Internal Medicine

## 2021-12-16 ENCOUNTER — Encounter (HOSPITAL_COMMUNITY): Payer: Self-pay | Admitting: *Deleted

## 2021-12-16 ENCOUNTER — Emergency Department (HOSPITAL_COMMUNITY): Payer: Medicare Other

## 2021-12-16 ENCOUNTER — Other Ambulatory Visit: Payer: Self-pay

## 2021-12-16 DIAGNOSIS — R6520 Severe sepsis without septic shock: Secondary | ICD-10-CM | POA: Diagnosis present

## 2021-12-16 DIAGNOSIS — Z79899 Other long term (current) drug therapy: Secondary | ICD-10-CM

## 2021-12-16 DIAGNOSIS — Z20822 Contact with and (suspected) exposure to covid-19: Secondary | ICD-10-CM | POA: Diagnosis present

## 2021-12-16 DIAGNOSIS — I10 Essential (primary) hypertension: Secondary | ICD-10-CM | POA: Diagnosis present

## 2021-12-16 DIAGNOSIS — Z8612 Personal history of poliomyelitis: Secondary | ICD-10-CM | POA: Diagnosis not present

## 2021-12-16 DIAGNOSIS — R600 Localized edema: Secondary | ICD-10-CM | POA: Diagnosis present

## 2021-12-16 DIAGNOSIS — G9341 Metabolic encephalopathy: Secondary | ICD-10-CM

## 2021-12-16 DIAGNOSIS — Z8546 Personal history of malignant neoplasm of prostate: Secondary | ICD-10-CM | POA: Diagnosis not present

## 2021-12-16 DIAGNOSIS — Z923 Personal history of irradiation: Secondary | ICD-10-CM | POA: Diagnosis not present

## 2021-12-16 DIAGNOSIS — A419 Sepsis, unspecified organism: Secondary | ICD-10-CM | POA: Diagnosis present

## 2021-12-16 DIAGNOSIS — F419 Anxiety disorder, unspecified: Secondary | ICD-10-CM

## 2021-12-16 DIAGNOSIS — D649 Anemia, unspecified: Secondary | ICD-10-CM | POA: Diagnosis present

## 2021-12-16 DIAGNOSIS — R630 Anorexia: Secondary | ICD-10-CM | POA: Diagnosis present

## 2021-12-16 DIAGNOSIS — R627 Adult failure to thrive: Secondary | ICD-10-CM | POA: Diagnosis present

## 2021-12-16 DIAGNOSIS — K219 Gastro-esophageal reflux disease without esophagitis: Secondary | ICD-10-CM | POA: Diagnosis present

## 2021-12-16 DIAGNOSIS — R609 Edema, unspecified: Secondary | ICD-10-CM | POA: Diagnosis not present

## 2021-12-16 DIAGNOSIS — E119 Type 2 diabetes mellitus without complications: Secondary | ICD-10-CM | POA: Diagnosis present

## 2021-12-16 DIAGNOSIS — N3001 Acute cystitis with hematuria: Secondary | ICD-10-CM | POA: Diagnosis present

## 2021-12-16 DIAGNOSIS — M109 Gout, unspecified: Secondary | ICD-10-CM | POA: Diagnosis present

## 2021-12-16 DIAGNOSIS — Z6825 Body mass index (BMI) 25.0-25.9, adult: Secondary | ICD-10-CM

## 2021-12-16 DIAGNOSIS — Z7401 Bed confinement status: Secondary | ICD-10-CM | POA: Diagnosis not present

## 2021-12-16 DIAGNOSIS — E861 Hypovolemia: Secondary | ICD-10-CM | POA: Diagnosis present

## 2021-12-16 DIAGNOSIS — N390 Urinary tract infection, site not specified: Secondary | ICD-10-CM

## 2021-12-16 DIAGNOSIS — N179 Acute kidney failure, unspecified: Secondary | ICD-10-CM | POA: Diagnosis present

## 2021-12-16 LAB — COMPREHENSIVE METABOLIC PANEL
ALT: 32 U/L (ref 0–44)
AST: 13 U/L — ABNORMAL LOW (ref 15–41)
Albumin: 3.3 g/dL — ABNORMAL LOW (ref 3.5–5.0)
Alkaline Phosphatase: 94 U/L (ref 38–126)
Anion gap: 8 (ref 5–15)
BUN: 30 mg/dL — ABNORMAL HIGH (ref 8–23)
CO2: 21 mmol/L — ABNORMAL LOW (ref 22–32)
Calcium: 8.8 mg/dL — ABNORMAL LOW (ref 8.9–10.3)
Chloride: 112 mmol/L — ABNORMAL HIGH (ref 98–111)
Creatinine, Ser: 1.49 mg/dL — ABNORMAL HIGH (ref 0.61–1.24)
GFR, Estimated: 48 mL/min — ABNORMAL LOW (ref >60)
Glucose, Bld: 153 mg/dL — ABNORMAL HIGH (ref 70–99)
Potassium: 4.1 mmol/L (ref 3.5–5.1)
Sodium: 141 mmol/L (ref 135–145)
Total Bilirubin: 1.1 mg/dL (ref 0.3–1.2)
Total Protein: 6.9 g/dL (ref 6.5–8.1)

## 2021-12-16 LAB — CBC
HCT: 33.6 % — ABNORMAL LOW (ref 39.0–52.0)
Hemoglobin: 11.2 g/dL — ABNORMAL LOW (ref 13.0–17.0)
MCH: 32.4 pg (ref 26.0–34.0)
MCHC: 33.3 g/dL (ref 30.0–36.0)
MCV: 97.1 fL (ref 80.0–100.0)
Platelets: 236 10*3/uL (ref 150–400)
RBC: 3.46 MIL/uL — ABNORMAL LOW (ref 4.22–5.81)
RDW: 16.2 % — ABNORMAL HIGH (ref 11.5–15.5)
WBC: 13.5 10*3/uL — ABNORMAL HIGH (ref 4.0–10.5)
nRBC: 0 % (ref 0.0–0.2)

## 2021-12-16 LAB — URINALYSIS, ROUTINE W REFLEX MICROSCOPIC
Bilirubin Urine: NEGATIVE
Glucose, UA: NEGATIVE mg/dL
Hgb urine dipstick: NEGATIVE
Ketones, ur: NEGATIVE mg/dL
Nitrite: POSITIVE — AB
Protein, ur: NEGATIVE mg/dL
Specific Gravity, Urine: 1.013 (ref 1.005–1.030)
WBC, UA: >50 WBC/hpf — ABNORMAL HIGH (ref 0–5)
pH: 5 (ref 5.0–8.0)

## 2021-12-16 LAB — HEMOGLOBIN A1C
Hgb A1c MFr Bld: 5.4 % (ref 4.8–5.6)
Mean Plasma Glucose: 108.28 mg/dL

## 2021-12-16 LAB — RESP PANEL BY RT-PCR (FLU A&B, COVID) ARPGX2
Influenza A by PCR: NEGATIVE
Influenza B by PCR: NEGATIVE
SARS Coronavirus 2 by RT PCR: NEGATIVE

## 2021-12-16 MED ORDER — INSULIN ASPART 100 UNIT/ML IJ SOLN: 0.0000 [IU] | Freq: Three times a day (TID) | INTRAMUSCULAR | Status: DC

## 2021-12-16 MED ORDER — SERTRALINE HCL 50 MG PO TABS
50.0000 mg | ORAL_TABLET | Freq: Every day | ORAL | Status: DC
  Administered 2021-12-17 – 2021-12-20 (×4): 50 mg via ORAL
  Filled 2021-12-16 (×4): qty 1

## 2021-12-16 MED ORDER — TAMSULOSIN HCL 0.4 MG PO CAPS
0.8000 mg | ORAL_CAPSULE | Freq: Every day | ORAL | Status: DC
  Administered 2021-12-17 – 2021-12-20 (×4): 0.8 mg via ORAL
  Filled 2021-12-16 (×4): qty 2

## 2021-12-16 MED ORDER — HYDROCODONE-ACETAMINOPHEN 7.5-325 MG PO TABS
1.0000 | ORAL_TABLET | ORAL | Status: DC | PRN
  Administered 2021-12-18 – 2021-12-19 (×2): 1 via ORAL
  Filled 2021-12-16 (×2): qty 1

## 2021-12-16 MED ORDER — ENOXAPARIN SODIUM 40 MG/0.4ML IJ SOSY
40.0000 mg | PREFILLED_SYRINGE | INTRAMUSCULAR | Status: DC
  Administered 2021-12-16 – 2021-12-19 (×4): 40 mg via SUBCUTANEOUS
  Filled 2021-12-16 (×4): qty 0.4

## 2021-12-16 MED ORDER — ALBUTEROL SULFATE (2.5 MG/3ML) 0.083% IN NEBU
2.5000 mg | INHALATION_SOLUTION | Freq: Four times a day (QID) | RESPIRATORY_TRACT | Status: DC
  Administered 2021-12-16 – 2021-12-18 (×8): 2.5 mg via RESPIRATORY_TRACT
  Filled 2021-12-16 (×9): qty 3

## 2021-12-16 MED ORDER — PANTOPRAZOLE SODIUM 40 MG PO TBEC
40.0000 mg | DELAYED_RELEASE_TABLET | Freq: Every day | ORAL | Status: DC
  Administered 2021-12-17 – 2021-12-20 (×4): 40 mg via ORAL
  Filled 2021-12-16 (×4): qty 1

## 2021-12-16 MED ORDER — ACETAMINOPHEN 325 MG PO TABS: 650.0000 mg | ORAL_TABLET | Freq: Four times a day (QID) | ORAL | Status: DC | PRN

## 2021-12-16 MED ORDER — SODIUM CHLORIDE 0.9 % IV SOLN: INTRAVENOUS | Status: AC

## 2021-12-16 MED ORDER — METOPROLOL SUCCINATE ER 50 MG PO TB24
100.0000 mg | ORAL_TABLET | Freq: Every day | ORAL | Status: DC
  Administered 2021-12-17 – 2021-12-20 (×4): 100 mg via ORAL
  Filled 2021-12-16 (×4): qty 2

## 2021-12-16 MED ORDER — DIAZEPAM 5 MG PO TABS: 5.0000 mg | ORAL_TABLET | Freq: Three times a day (TID) | ORAL | Status: DC | PRN

## 2021-12-16 MED ORDER — ONDANSETRON HCL 4 MG/2ML IJ SOLN: 4.0000 mg | Freq: Four times a day (QID) | INTRAMUSCULAR | Status: DC | PRN

## 2021-12-16 MED ORDER — SODIUM CHLORIDE 0.9 % IV BOLUS
500.0000 mL | Freq: Once | INTRAVENOUS | Status: AC
  Administered 2021-12-16: 500 mL via INTRAVENOUS

## 2021-12-16 MED ORDER — ALLOPURINOL 300 MG PO TABS
300.0000 mg | ORAL_TABLET | Freq: Two times a day (BID) | ORAL | Status: DC
  Administered 2021-12-17 – 2021-12-20 (×7): 300 mg via ORAL
  Filled 2021-12-16 (×7): qty 1

## 2021-12-16 MED ORDER — BISACODYL 10 MG RE SUPP: 10.0000 mg | RECTAL | Status: DC | PRN

## 2021-12-16 MED ORDER — POLYETHYLENE GLYCOL 3350 17 G PO PACK
17.0000 g | PACK | Freq: Two times a day (BID) | ORAL | Status: DC
  Administered 2021-12-17 – 2021-12-19 (×4): 17 g via ORAL
  Filled 2021-12-16 (×5): qty 1

## 2021-12-16 MED ORDER — SENNA 8.6 MG PO TABS
1.0000 | ORAL_TABLET | Freq: Two times a day (BID) | ORAL | Status: DC
  Administered 2021-12-17 – 2021-12-19 (×4): 8.6 mg via ORAL
  Filled 2021-12-16 (×5): qty 1

## 2021-12-16 MED ORDER — POLYETHYLENE GLYCOL 3350 17 G PO PACK: 17.0000 g | PACK | Freq: Every day | ORAL | Status: DC

## 2021-12-16 MED ORDER — ONDANSETRON HCL 4 MG PO TABS: 4.0000 mg | ORAL_TABLET | Freq: Four times a day (QID) | ORAL | Status: DC | PRN

## 2021-12-16 MED ORDER — SODIUM CHLORIDE 0.9 % IV SOLN
1.0000 g | INTRAVENOUS | Status: DC
  Administered 2021-12-17 – 2021-12-18 (×2): 1 g via INTRAVENOUS
  Filled 2021-12-16 (×2): qty 10

## 2021-12-16 MED ORDER — ROSUVASTATIN CALCIUM 5 MG PO TABS
5.0000 mg | ORAL_TABLET | Freq: Every evening | ORAL | Status: DC
  Administered 2021-12-17 – 2021-12-19 (×3): 5 mg via ORAL
  Filled 2021-12-16 (×3): qty 1

## 2021-12-16 MED ORDER — SODIUM CHLORIDE 0.9 % IV SOLN
1.0000 g | Freq: Once | INTRAVENOUS | Status: AC
  Administered 2021-12-16: 1 g via INTRAVENOUS
  Filled 2021-12-16: qty 10

## 2021-12-16 NOTE — H&P (Signed)
History and Physical  Eduardo Moon XEN:[id number] DOB: November 15, 1945 DOA: 12/16/2021  PCP: Girtha Rm, NP-C Patient coming from: Home  I have personally briefly reviewed patient's old medical records in [facility]   Chief Complaint: Confusion  HPI: Eduardo Moon is a 76 y.o. 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  She relates has been having darker urine with foul-smelling and hematuria this morning he has been more confused over the last 2 or 3 days less communicative with significant decreased oral intake.  In the ED: Was found to be afebrile vitals are stable, cell count of 13 urine appears to be infected with positive nitrates large leukocytes, he was started on IV Rocephin SARS-CoV-2 and influenza PCR negative.   Review of Systems: All systems reviewed and apart from history of presenting illness, are negative.  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   uses w/c for distance   Past Surgical History:  Procedure Laterality Date   [place]   BILATERAL   ORCHIECTOMY  05/07/2012   Procedure: ORCHIECTOMY;  Surgeon: Bernestine Amass, MD;  Location: [facility];  Service: Urology;  Laterality: Right;   RADIOACTIVE PROSTATE SEED IMPLANTS  04-21-2011    DR [facility]   PROSTATE CANCER   SCROTAL EXPLORATION  05/07/2012   Procedure: SCROTUM EXPLORATION;  Surgeon: Bernestine Amass, MD;  Location: [facility];  Service: Urology;  Laterality: Right;   DRAINAGE OF ABSCESS  1 HR  UHC MEDICARE   Social History:  reports that he has never smoked. He has never used smokeless tobacco. He reports that he does not drink alcohol and does not use drugs.   No Known Allergies  Family History  Problem Relation Age of Onset   Cancer Mother    Cancer Father    Cancer Brother     Prior to Admission medications   Medication Sig Start Date End Date Taking? Authorizing Provider  acetaminophen (TYLENOL) 325 MG tablet Take 2 tablets (650 mg total) by mouth every 6 (six) hours as needed for mild pain (or Fever >/= 101). 10/23/20  Yes Sheikh, Omair Latif, DO  allopurinol (ZYLOPRIM) 300 MG tablet Take 1 tablet (300 mg total) by mouth 2 (two) times daily. 11/07/20  Yes Medina-Vargas, Monina C, NP  amLODipine (NORVASC) 5 MG tablet Take 1 tablet (5 mg total) by mouth daily. 11/07/20  Yes Medina-Vargas, Monina C, NP  bisacodyl (DULCOLAX) 10 MG suppository Place 10 mg rectally as needed for moderate constipation.   Yes [provider]  diazepam (VALIUM) 5 MG tablet Take 1 tablet (5 mg total) by mouth every 8 (eight) hours as needed for anxiety. 11/07/20  Yes Medina-Vargas, Monina C, NP  Diclofenac Sodium CR 100 MG 24 hr tablet Take 100 mg by mouth at bedtime.   Yes [provider]  HYDROcodone-acetaminophen (NORCO) 7.5-325 MG tablet Take 1 tablet by mouth daily as needed. Patient taking differently: Take 1 tablet  by mouth daily as needed for moderate pain. 11/10/20  Yes Medina-Vargas, Monina C, NP  lisinopril (ZESTRIL) 30 MG tablet Take 30 mg by mouth daily. 11/13/21  Yes [provider]  metoprolol succinate (TOPROL-XL) 100 MG 24 hr tablet Take 1 tablet (100 mg total) by mouth daily. 11/07/20  Yes Medina-Vargas, Monina C, NP  naproxen (NAPROSYN) 375 MG tablet Take 375 mg by mouth 2 (two) times daily. 12/10/21  Yes [provider]  omeprazole (PRILOSEC) 20 MG capsule Take 1 capsule (20 mg total) by mouth 2 (two) times daily. 11/07/20   Yes Medina-Vargas, Monina C, NP  rosuvastatin (CRESTOR) 5 MG tablet Take 1 tablet (5 mg total) by mouth every evening. 11/07/20  Yes Medina-Vargas, Monina C, NP  sertraline (ZOLOFT) 50 MG tablet Take 50 mg by mouth daily. 11/01/21  Yes [provider]  tamsulosin (FLOMAX) 0.4 MG CAPS capsule Take 0.4 mg by mouth daily. 10/02/21  Yes [provider]  Vitamin D, Ergocalciferol, (DRISDOL) 1.25 MG (50000 UNIT) CAPS capsule Take 1 capsule (50,000 Units total) by mouth once a week. 11/07/20  Yes Medina-Vargas, Monina C, NP  blood glucose meter kit and supplies KIT Dispense based on patient and insurance preference. Use up to four times daily as directed. (FOR ICD-9 250.00, 250.01). Patient taking differently: Inject 1 each into the skin See admin instructions. Dispense based on patient and insurance preference. Use up to four times daily as directed. (FOR ICD-9 250.00, 250.01). 06/15/20   Hosie Poisson, MD  lisinopril (ZESTRIL) 20 MG tablet TAKE 1 TABLET BY MOUTH EVERY DAY Patient not taking: Reported on 12/16/2021 12/03/20   Medina-Vargas, Monina C, NP  Nystatin ([name]'S BUTT CREAM) CREA Apply 1 application topically 2 (two) times daily. Patient not taking: Reported on 12/16/2021 10/23/20   Raiford Noble Latif, DO  polyethylene glycol (MIRALAX / GLYCOLAX) 17 g packet Take 17 g by mouth 2 (two) times daily. Patient not taking: Reported on 12/16/2021 11/07/20   Medina-Vargas, Monina C, NP  senna (SENOKOT) 8.6 MG TABS tablet Take 1 tablet (8.6 mg total) by mouth 2 (two) times daily. Patient not taking: Reported on 12/16/2021 11/07/20   Medina-Vargas, Monina C, NP  vitamin B-12 (CYANOCOBALAMIN) 500 MCG tablet Take 1 tablet (500 mcg total) by mouth daily. Patient not taking: Reported on 12/16/2021 11/07/20   Medina-Vargas, Jaymes Graff C, NP   Physical Exam: Vitals:   12/16/21 1330 12/16/21 1400 12/16/21 1500 12/16/21 1600  BP: 137/65 137/63 128/70 129/73  Pulse: 64 63 66 68  Resp: 13 16 (!) 25 18  Temp:       TempSrc:      SpO2: 97% 95% 93% 95%  Weight:      Height:        General exam: Moderately built and nourished patient, lying comfortably supine on the gurney in no obvious distress. Head, eyes and ENT: Nontraumatic and normocephalic.  Neck: Supple. No JVD, carotid bruit or thyromegaly. Lymphatics: No lymphadenopathy. Respiratory system: Clear to auscultation. No increased work of breathing. Cardiovascular system: S1 and S2 heard, RRR. No JVD. Gastrointestinal system: Abdomen is nondistended, soft and nontender. Normal bowel sounds heard. No organomegaly or masses appreciated. Central nervous system: Awake alert and oriented x1 Extremities: Symmetric 5 x 5 power. Peripheral pulses symmetrically felt.  Skin: No rashes or acute findings. Musculoskeletal system: Negative exam. Psychiatry: Pleasantly confused.     Labs on Admission:  Basic Metabolic Panel: Recent Labs  Lab 12/16/21 1138  NA 141  K 4.1  CL  112*  CO2 21*  GLUCOSE 153*  BUN 30*  CREATININE 1.49*  CALCIUM 8.8*   Liver Function Tests: Recent Labs  Lab 12/16/21 1138  AST 13*  ALT 32  ALKPHOS 94  BILITOT 1.1  PROT 6.9  ALBUMIN 3.3*   No results for input(s): LIPASE, AMYLASE in the last 168 hours. No results for input(s): AMMONIA in the last 168 hours. CBC: Recent Labs  Lab 12/16/21 1138  WBC 13.5*  HGB 11.2*  HCT 33.6*  MCV 97.1  PLT 236   Cardiac Enzymes: No results for input(s): CKTOTAL, CKMB, CKMBINDEX, TROPONINI in the last 168 hours.  BNP (last 3 results) No results for input(s): PROBNP in the last 8760 hours. CBG: No results for input(s): GLUCAP in the last 168 hours.  Radiological Exams on Admission: DG Chest 2 View  Result Date: 12/16/2021 CLINICAL DATA:  Cough.  Altered mental status. EXAM: CHEST - 2 VIEW COMPARISON:  06/22/2020 FINDINGS: Atherosclerotic calcification of the aortic arch. Heart size within normal limits for projection. Hazy lower lobe density on the lateral  projection not well corroborated on the frontal view. The lungs appear otherwise clear. No blunting of the costophrenic angles. Suspected shoulder arthropathy likely with chronic rotator cuff tears. IMPRESSION: 1. Hazy density projecting over the posterior basal segments of the lower lobes on the lateral projection but not well corroborated on the frontal view. Early pneumonia or bronchopneumonia is not excluded. 2.  Aortic Atherosclerosis (ICD10-I70.0). Electronically Signed   By: Van Clines M.D.   On: 12/16/2021 11:52    EKG: Independently reviewed.  Normal sinus rhythm normal axis nonspecific T wave changes.  Assessment/Plan Severe sepsis ([place]) due to UTI: His vitals are stable, admitted to [facility]. Urine cultures and blood cultures have been sent. Agree with IV Rocephin. Admit him to MedSurg. Consult physical therapy and Occupational Therapy.  Acute metabolic encephalopathy: Likely due to infectious etiology. He is alert to person but confused and hallucinating when I was in the room he was talking about spiders.  Acute kidney injury: Likely prerenal azotemia setting of hypovolemia, ACE inhibitor use and probably NSAID use we will start him on aggressive IV fluid hydration recheck basic metabolic panel tomorrow morning. Follow strict I's and O's.  Essential HTN (hypertension) Hold all antihypertensive medication except for beta-blockers. Blood pressure is currently stable if his blood pressure starts trending up will be reevaluated in the morning.  DM2 (diabetes mellitus, type 2) (HCC) Check an A1c, currently n.p.o. due to his acute encephalopathy. sliding scale with CBGs every 4.  Normocytic anemia: With an elevated RDW, check anemia panel tomorrow morning after inflammation and fracture has subsided.   DVT Prophylaxis: lovenox Code Status: Full  Family Communication: daughter  Disposition Plan: inpatient      It is my clinical opinion that admission to INPATIENT  is reasonable and necessary in this 76 y.o. male history of polio prostate cancer status post seeding in 2012, and hypertension comes in for severe sepsis and hallucinations probably due to UTI   Given the aforementioned, the predictability of an adverse outcome is felt to be significant. I expect that the patient will require at least 2 midnights in the hospital to treat this condition.  Charlynne Cousins MD [facility]   12/16/2021, 5:00 PM

## 2021-12-16 NOTE — ED Triage Notes (Addendum)
BIB EMS at family's request. AMS pt alert and oriented on arrival, ? UTI and cough x 3 days. 132/74-70-19-98%-CBG 130 #20 L AC 12 Ld unremarkable.

## 2021-12-16 NOTE — Progress Notes (Signed)
Patient to room 5E15 from ED. Vital signs obtained. On monitor CCMD notified. Alert and orientedx3. Call bell within reach.  Era Bumpers, RN

## 2021-12-16 NOTE — ED Provider Notes (Signed)
[facility] DEPT Provider Note   CSN: [id number] Arrival date & time: 12/16/21  1120     History  Chief Complaint  Patient presents with   Altered Mental Status   Urinary Tract Infection   Cough    Bishoy Cupp is a 76 y.o. male.  Patient is a 76 year old male who presents with possible UTI.  History is obtained by the patient and the EMS.  Patient is bedbound at baseline.  He has had prior polio.  He also has a history of hypertension, prostate cancer status post radiation seeds, gout.  He lives at home.  He reportedly has had some dark urine and some hematuria since yesterday.  EMS reports as he was more confused than normal.  He is very communicative and is alert and oriented but is confused as to the year and month.  He denies any increased weakness or fatigue.  No shortness of breath.  No nausea or vomiting.  No known fevers.  No abdominal pain.      Home Medications Prior to Admission medications   Medication Sig Start Date End Date Taking? Authorizing Provider  acetaminophen (TYLENOL) 325 MG tablet Take 2 tablets (650 mg total) by mouth every 6 (six) hours as needed for mild pain (or Fever >/= 101). 10/23/20  Yes Sheikh, Omair Latif, DO  allopurinol (ZYLOPRIM) 300 MG tablet Take 1 tablet (300 mg total) by mouth 2 (two) times daily. 11/07/20  Yes Medina-Vargas, Monina C, NP  amLODipine (NORVASC) 5 MG tablet Take 1 tablet (5 mg total) by mouth daily. 11/07/20  Yes Medina-Vargas, Monina C, NP  bisacodyl (DULCOLAX) 10 MG suppository Place 10 mg rectally as needed for moderate constipation.   Yes [provider]  diazepam (VALIUM) 5 MG tablet Take 1 tablet (5 mg total) by mouth every 8 (eight) hours as needed for anxiety. 11/07/20  Yes Medina-Vargas, Monina C, NP  Diclofenac Sodium CR 100 MG 24 hr tablet Take 100 mg by mouth at bedtime.   Yes [provider]  HYDROcodone-acetaminophen (NORCO) 7.5-325 MG tablet Take 1 tablet by mouth  daily as needed. Patient taking differently: Take 1 tablet by mouth daily as needed for moderate pain. 11/10/20  Yes Medina-Vargas, Monina C, NP  lisinopril (ZESTRIL) 30 MG tablet Take 30 mg by mouth daily. 11/13/21  Yes [provider]  metoprolol succinate (TOPROL-XL) 100 MG 24 hr tablet Take 1 tablet (100 mg total) by mouth daily. 11/07/20  Yes Medina-Vargas, Monina C, NP  naproxen (NAPROSYN) 375 MG tablet Take 375 mg by mouth 2 (two) times daily. 12/10/21  Yes [provider]  omeprazole (PRILOSEC) 20 MG capsule Take 1 capsule (20 mg total) by mouth 2 (two) times daily. 11/07/20  Yes Medina-Vargas, Monina C, NP  rosuvastatin (CRESTOR) 5 MG tablet Take 1 tablet (5 mg total) by mouth every evening. 11/07/20  Yes Medina-Vargas, Monina C, NP  sertraline (ZOLOFT) 50 MG tablet Take 50 mg by mouth daily. 11/01/21  Yes [provider]  tamsulosin (FLOMAX) 0.4 MG CAPS capsule Take 0.4 mg by mouth daily. 10/02/21  Yes [provider]  Vitamin D, Ergocalciferol, (DRISDOL) 1.25 MG (50000 UNIT) CAPS capsule Take 1 capsule (50,000 Units total) by mouth once a week. 11/07/20  Yes Medina-Vargas, Monina C, NP  blood glucose meter kit and supplies KIT Dispense based on patient and insurance preference. Use up to four times daily as directed. (FOR ICD-9 250.00, 250.01). Patient taking differently: Inject 1 each into the skin See  admin instructions. Dispense based on patient and insurance preference. Use up to four times daily as directed. (FOR ICD-9 250.00, 250.01). 06/15/20   Hosie Poisson, MD  lisinopril (ZESTRIL) 20 MG tablet TAKE 1 TABLET BY MOUTH EVERY DAY Patient not taking: Reported on 12/16/2021 12/03/20   Medina-Vargas, Monina C, NP  Nystatin ([name]'S BUTT CREAM) CREA Apply 1 application topically 2 (two) times daily. Patient not taking: Reported on 12/16/2021 10/23/20   Raiford Noble Latif, DO  polyethylene glycol (MIRALAX / GLYCOLAX) 17 g packet Take 17 g by mouth 2 (two) times  daily. Patient not taking: Reported on 12/16/2021 11/07/20   Medina-Vargas, Monina C, NP  senna (SENOKOT) 8.6 MG TABS tablet Take 1 tablet (8.6 mg total) by mouth 2 (two) times daily. Patient not taking: Reported on 12/16/2021 11/07/20   Medina-Vargas, Monina C, NP  vitamin B-12 (CYANOCOBALAMIN) 500 MCG tablet Take 1 tablet (500 mcg total) by mouth daily. Patient not taking: Reported on 12/16/2021 11/07/20   Medina-Vargas, Senaida Lange, NP      Allergies    Patient has no known allergies.    Review of Systems   Review of Systems  Constitutional:  Negative for chills, diaphoresis, fatigue and fever.  HENT:  Negative for congestion, rhinorrhea and sneezing.   Eyes: Negative.   Respiratory:  Negative for cough, chest tightness and shortness of breath.   Cardiovascular:  Negative for chest pain and leg swelling.  Gastrointestinal:  Negative for abdominal pain, blood in stool, diarrhea, nausea and vomiting.  Genitourinary:  Positive for hematuria. Negative for difficulty urinating, flank pain and frequency.  Musculoskeletal:  Negative for arthralgias and back pain.  Skin:  Negative for rash.  Neurological:  Negative for dizziness, speech difficulty, weakness, numbness and headaches.   Physical Exam Updated Vital Signs BP 129/73   Pulse 68   Temp 98.3 F (36.8 C) (Oral)   Resp 18   Ht 5' 6"  (1.676 m)   Wt 73 kg   SpO2 95%   BMI 25.99 kg/m  Physical Exam Constitutional:      Appearance: He is well-developed.  HENT:     Head: Normocephalic and atraumatic.  Eyes:     Pupils: Pupils are equal, round, and reactive to light.  Cardiovascular:     Rate and Rhythm: Normal rate and regular rhythm.     Heart sounds: Normal heart sounds.  Pulmonary:     Effort: Pulmonary effort is normal. No respiratory distress.     Breath sounds: Normal breath sounds. No wheezing or rales.  Chest:     Chest wall: No tenderness.  Abdominal:     General: Bowel sounds are normal.     Palpations: Abdomen is  soft.     Tenderness: There is no abdominal tenderness. There is no guarding or rebound.  Musculoskeletal:        General: Normal range of motion.     Cervical back: Normal range of motion and neck supple.  Lymphadenopathy:     Cervical: No cervical adenopathy.  Skin:    General: Skin is warm and dry.     Findings: No rash.     Comments: No decubitus pressure sores noted.  Neurological:     General: No focal deficit present.     Mental Status: He is alert.     Comments: Oriented to person and place.  He said the year was 2024 and the month was June.  He did know it was Monday.  He has some chronic weakness  in his lower extremities, more on the left as compared to the right.  He reports this is from prior polio.    ED Results / Procedures / Treatments   Labs (all labs ordered are listed, but only abnormal results are displayed) Labs Reviewed  URINALYSIS, ROUTINE W REFLEX MICROSCOPIC - Abnormal; Notable for the following components:      Result Value   APPearance HAZY (*)    Nitrite POSITIVE (*)    Leukocytes,Ua LARGE (*)    WBC, UA >50 (*)    Bacteria, UA FEW (*)    All other components within normal limits  COMPREHENSIVE METABOLIC PANEL - Abnormal; Notable for the following components:   Chloride 112 (*)    CO2 21 (*)    Glucose, Bld 153 (*)    BUN 30 (*)    Creatinine, Ser 1.49 (*)    Calcium 8.8 (*)    Albumin 3.3 (*)    AST 13 (*)    GFR, Estimated 48 (*)    All other components within normal limits  CBC - Abnormal; Notable for the following components:   WBC 13.5 (*)    RBC 3.46 (*)    Hemoglobin 11.2 (*)    HCT 33.6 (*)    RDW 16.2 (*)    All other components within normal limits  RESP PANEL BY RT-PCR (FLU A&B, COVID) [id number]  URINE CULTURE    EKG EKG Interpretation  Date/Time:  Monday Dec 16 2021 11:35:11 EDT Ventricular Rate:  68 PR Interval:  229 QRS Duration: 106 QT Interval:  404 QTC Calculation: 430 R Axis:   20 Text Interpretation: Sinus rhythm  Prolonged PR interval Confirmed by Malvin Johns [phone]) on 12/16/2021 11:45:18 AM  Radiology DG Chest 2 View  Result Date: 12/16/2021 CLINICAL DATA:  Cough.  Altered mental status. EXAM: CHEST - 2 VIEW COMPARISON:  06/22/2020 FINDINGS: Atherosclerotic calcification of the aortic arch. Heart size within normal limits for projection. Hazy lower lobe density on the lateral projection not well corroborated on the frontal view. The lungs appear otherwise clear. No blunting of the costophrenic angles. Suspected shoulder arthropathy likely with chronic rotator cuff tears. IMPRESSION: 1. Hazy density projecting over the posterior basal segments of the lower lobes on the lateral projection but not well corroborated on the frontal view. Early pneumonia or bronchopneumonia is not excluded. 2.  Aortic Atherosclerosis (ICD10-I70.0). Electronically Signed   By: Van Clines M.D.   On: 12/16/2021 11:52    Procedures Procedures    Medications Ordered in ED Medications  cefTRIAXone (ROCEPHIN) 1 g in sodium chloride 0.9 % 100 mL IVPB (1 g Intravenous New Bag/Given 12/16/21 1644)  sodium chloride 0.9 % bolus 500 mL (500 mLs Intravenous New Bag/Given 12/16/21 1351)    ED Course/ Medical Decision Making/ A&P                           Medical Decision Making Amount and/or Complexity of Data Reviewed Labs: ordered. Radiology: ordered.  Risk Decision regarding hospitalization.   Patient is a 76 year old male who presents with some generalized weakness.  He had some urinary symptoms and ongoing cough.  His daughter is at bedside and said that he is also had some increased confusion and weakness since yesterday.  He has not been eating or drinking as well.  He has had some hallucinations per her report.  His urine does appear to be infected.  He was given dose of IV  Rocephin.  His creatinine is elevated as compared to his baseline values.  His white count is elevated.  He is afebrile.  He was given IV fluids  as well.  I spoke with Dr. Olevia Bowens who will admit the patient for further treatment.  Final Clinical Impression(s) / ED Diagnoses Final diagnoses:  Acute cystitis with hematuria    Rx / DC Orders ED Discharge Orders     None         Malvin Johns, MD 12/16/21 [phone]

## 2021-12-16 NOTE — Progress Notes (Signed)
[name], [name], called and requested that MD call for updates if she is not present at bedside during rounds. Plan of care ongoing.

## 2021-12-17 DIAGNOSIS — N179 Acute kidney failure, unspecified: Secondary | ICD-10-CM | POA: Diagnosis not present

## 2021-12-17 DIAGNOSIS — G9341 Metabolic encephalopathy: Secondary | ICD-10-CM | POA: Diagnosis not present

## 2021-12-17 DIAGNOSIS — N3001 Acute cystitis with hematuria: Secondary | ICD-10-CM | POA: Diagnosis not present

## 2021-12-17 DIAGNOSIS — A419 Sepsis, unspecified organism: Secondary | ICD-10-CM | POA: Diagnosis not present

## 2021-12-17 LAB — CBC
HCT: 32.5 % — ABNORMAL LOW (ref 39.0–52.0)
Hemoglobin: 10.5 g/dL — ABNORMAL LOW (ref 13.0–17.0)
MCH: 31.4 pg (ref 26.0–34.0)
MCHC: 32.3 g/dL (ref 30.0–36.0)
MCV: 97.3 fL (ref 80.0–100.0)
Platelets: 169 10*3/uL (ref 150–400)
RBC: 3.34 MIL/uL — ABNORMAL LOW (ref 4.22–5.81)
RDW: 15.9 % — ABNORMAL HIGH (ref 11.5–15.5)
WBC: 10.3 10*3/uL (ref 4.0–10.5)
nRBC: 0 % (ref 0.0–0.2)

## 2021-12-17 LAB — BASIC METABOLIC PANEL
Anion gap: 8 (ref 5–15)
BUN: 19 mg/dL (ref 8–23)
CO2: 20 mmol/L — ABNORMAL LOW (ref 22–32)
Calcium: 8.5 mg/dL — ABNORMAL LOW (ref 8.9–10.3)
Chloride: 117 mmol/L — ABNORMAL HIGH (ref 98–111)
Creatinine, Ser: 0.97 mg/dL (ref 0.61–1.24)
GFR, Estimated: >60 mL/min (ref >60)
Glucose, Bld: 95 mg/dL (ref 70–99)
Potassium: 3.4 mmol/L — ABNORMAL LOW (ref 3.5–5.1)
Sodium: 145 mmol/L (ref 135–145)

## 2021-12-17 LAB — GLUCOSE, CAPILLARY
Glucose-Capillary: 82 mg/dL (ref 70–99)
Glucose-Capillary: 116 mg/dL — ABNORMAL HIGH (ref 70–99)
Glucose-Capillary: 125 mg/dL — ABNORMAL HIGH (ref 70–99)
Glucose-Capillary: 148 mg/dL — ABNORMAL HIGH (ref 70–99)

## 2021-12-17 LAB — RETICULOCYTES
Immature Retic Fract: 15 % (ref 2.3–15.9)
RBC.: 3.33 MIL/uL — ABNORMAL LOW (ref 4.22–5.81)
Retic Count, Absolute: 47 10*3/uL (ref 19.0–186.0)
Retic Ct Pct: 1.4 % (ref 0.4–3.1)

## 2021-12-17 LAB — FERRITIN: Ferritin: 246 ng/mL (ref 24–336)

## 2021-12-17 LAB — VITAMIN B12: Vitamin B-12: 574 pg/mL (ref 180–914)

## 2021-12-17 LAB — FOLATE: Folate: 24 ng/mL (ref >5.9)

## 2021-12-17 LAB — IRON AND TIBC
Iron: 17 ug/dL — ABNORMAL LOW (ref 45–182)
Saturation Ratios: 11 % — ABNORMAL LOW (ref 17.9–39.5)
TIBC: 160 ug/dL — ABNORMAL LOW (ref 250–450)
UIBC: 143 ug/dL

## 2021-12-17 MED ORDER — POTASSIUM CHLORIDE 20 MEQ PO PACK
40.0000 meq | PACK | Freq: Two times a day (BID) | ORAL | Status: AC
  Administered 2021-12-17 (×2): 40 meq via ORAL
  Filled 2021-12-17 (×2): qty 2

## 2021-12-17 MED ORDER — ENSURE ENLIVE PO LIQD
237.0000 mL | Freq: Two times a day (BID) | ORAL | Status: DC
  Administered 2021-12-17 – 2021-12-20 (×6): 237 mL via ORAL

## 2021-12-17 MED ORDER — ADULT MULTIVITAMIN W/MINERALS CH
1.0000 | ORAL_TABLET | Freq: Every day | ORAL | Status: DC
  Administered 2021-12-17 – 2021-12-20 (×4): 1 via ORAL
  Filled 2021-12-17 (×4): qty 1

## 2021-12-17 NOTE — Evaluation (Signed)
Occupational Therapy Evaluation Patient Details Name: Eduardo Moon MRN: [id number] DOB: 12/20/1945 Today's Date: 12/17/2021   History of Present Illness PT is a 76yo male admitted with dark urine, hematuria and confusion.  Pt has h/o polio and has been bedbound for 2 years, prostate CA, DMII and recent upper respiratory infection.   Clinical Impression   Pt was admitted with the above diagnosis and is limited with self feeding and ability to tolerate sitting in the w/c at this time. Pt has been bedbound for last two years but does feed self at home and does sit in chair when son total lifts him there.  Feel pt would benefit from OT ensure he can still feed self (NPO at this time) and ensure pt can tolerate sitting in w/c at time of d/c. Pt has total care from daughter, [name], in all self care and mobility outside of feeding and sitting in w/c.  Will see for one more session to ensure above is manageable so pt can safely d/c home.       Recommendations for follow up therapy are one component of a multi-disciplinary discharge planning process, led by the attending physician.  Recommendations may be updated based on patient status, additional functional criteria and insurance authorization.   Follow Up Recommendations  No OT follow up    Assistance Recommended at Discharge Intermittent Supervision/Assistance  Patient can return home with the following A lot of help with walking and/or transfers;A lot of help with bathing/dressing/bathroom;Assist for transportation;Help with stairs or ramp for entrance;Assistance with cooking/housework    Functional Status Assessment  Patient has had a recent decline in their functional status and demonstrates the ability to make significant improvements in function in a reasonable and predictable amount of time.  Equipment Recommendations  None recommended by OT    Recommendations for Other Services       Precautions / Restrictions Restrictions Weight  Bearing Restrictions: No      Mobility Bed Mobility Overal bed mobility: Needs Assistance Bed Mobility: Rolling Rolling: Mod assist         General bed mobility comments: Pt's son assists pt out of bed and into w/c at home with total lift.    Transfers Overall transfer level: Needs assistance   Transfers: Bed to chair/wheelchair/BSC             General transfer comment: Pt rarely puts weight through his feet. Pt stated occastionally he will when son lifts him into w/c but not often. Transfer via Lift Equipment: [employer]                                           ADL either performed or assessed with clinical judgement   ADL Overall ADL's : Needs assistance/impaired Eating/Feeding: NPO   Grooming: Wash/dry hands;Wash/dry face;Minimal assistance;Sitting   Upper Body Bathing: Maximal assistance;Bed level   Lower Body Bathing: Total assistance;Bed level;+2 for physical assistance   Upper Body Dressing : Moderate assistance;Bed level   Lower Body Dressing: Total assistance;+2 for physical assistance;Bed level     Toilet Transfer Details (indicate cue type and reason): pt does all toileting at bed level therefore no transfer Toileting- Clothing Manipulation and Hygiene: Total assistance     Tub/Shower Transfer Details (indicate cue type and reason): Pt does all bathing in the bed Functional mobility during ADLs: Total assistance General ADL Comments: Pt  was dependent with all adls prior to admit except feeding.     Vision Baseline Vision/History: 0 No visual deficits Ability to See in Adequate Light: 0 Adequate Patient Visual Report: No change from baseline Vision Assessment?: No apparent visual deficits     Perception     Praxis      Pertinent Vitals/Pain Pain Assessment Pain Assessment: No/denies pain     Hand Dominance Right   Extremity/Trunk Assessment Upper Extremity Assessment Upper Extremity Assessment: RUE  deficits/detail;LUE deficits/detail RUE Deficits / Details: ROM WFL except shoulder flexion limited to 100 degrees. Strength:  shoulder 3+/5, biceps/triceps 4/5, grip 4/5 RUE Sensation: WNL RUE Coordination: decreased gross motor LUE Deficits / Details: AROM limited in shoulder to 100 degrees. STrength:  shoulder 3+/5, biceps and triceps 4/5, grip 4/5 LUE Sensation: WNL LUE Coordination: decreased gross motor   Lower Extremity Assessment Lower Extremity Assessment: Defer to PT evaluation       Communication Communication Communication: No difficulties   Cognition Arousal/Alertness: Awake/alert Behavior During Therapy: WFL for tasks assessed/performed Overall Cognitive Status: Within Functional Limits for tasks assessed                                 General Comments: Pt w confusion that has appeared to have cleared since admission.     General Comments  Pt apepars to be close to baseline with adls. Will ensure pt can feed self and tolerate sitting in w/c from endurance/strength standpoint.    Exercises     Shoulder Instructions      Home Living Family/patient expects to be discharged to:: Private residence Living Arrangements: Children Available Help at Discharge: Family;Available 24 hours/day Type of Home: House Home Access: Stairs to enter [employer] of Steps: 2   Home Layout: One level         Biochemist, clinical: [facility] bed;Wheelchair - manual   Additional Comments: family bumps pt down steps in w/c for dr. Kendrick Fries only.  Otherwise pt does not leave the home.      Prior Functioning/Environment Prior Level of Function : Needs assist       Physical Assist : Mobility (physical);ADLs (physical) Mobility (physical): Bed mobility;Transfers (pt totalt assist for all mobility. Son lifts pt into the w/c) ADLs (physical): Grooming;Bathing;Dressing;Toileting;IADLs (dependent all adls except feeding.) Mobility  Comments: Pt spends most of the day in bed but son does get pt to w/c some as well. Pt can tolerate sitting in w/c to come to kitchen for am meal. Pt is total lift into and out of w/c. Pt does not ambulate ADLs Comments: Pt is fully dependent for all adls except feeding and has been for 2 years. Daughter bathes, dresses, toilets and grooms pt in the bed.        OT Problem List: Decreased strength;Decreased range of motion;Impaired balance (sitting and/or standing);Decreased knowledge of use of DME or AE;Impaired UE functional use      OT Treatment/Interventions: Self-care/ADL training    OT Goals(Current goals can be found in the care plan section) Acute Rehab OT Goals Patient Stated Goal: to make sure I can still still tolerate sitting in w/c OT Goal Formulation: With patient Time For Goal Achievement: 12/31/21 Potential to Achieve Goals: Fair ADL Goals Pt Will Perform Eating: with set-up;sitting Additional ADL Goal #1: Pt will tolerate sitting in w/c with supervision so he can safely sit in w/c when d/c'd  home with daughter  OT Frequency: Min 2X/week    Co-evaluation              AM-PAC OT "6 Clicks" Daily Activity     Outcome Measure Help from another person eating meals?: Total Help from another person taking care of personal grooming?: Total Help from another person toileting, which includes using toliet, bedpan, or urinal?: Total Help from another person bathing (including washing, rinsing, drying)?: Total Help from another person to put on and taking off regular upper body clothing?: Total Help from another person to put on and taking off regular lower body clothing?: Total 6 Click Score: 6   End of Session Nurse Communication: Mobility status  Activity Tolerance: Patient tolerated treatment well Patient left: in bed;with call bell/phone within reach  OT Visit Diagnosis: Other abnormalities of gait and mobility (R26.89)                Time: 7829-5621 OT Time  Calculation (min): 16 min Charges:  OT General Charges $OT Visit: 1 Visit OT Evaluation $OT Eval Low Complexity: 1 Low  Glenford Peers 12/17/2021, 9:26 AM

## 2021-12-17 NOTE — Progress Notes (Signed)
[facility] PROGRESS NOTE    Progress Note  Eduardo Moon  KNL:[id number] DOB: 16-Aug-1945 DOA: 12/16/2021 PCP: Girtha Rm, NP-C     Brief Narrative:   Eduardo Moon is an 76 y.o. male past medical history of polio essentially bedbound, essential hypertension diabetes mellitus type 2 with history of prostate cancer since 2012 post radiation seeding with a recent upper respiratory tract infection is brought in by his caregiver for confusion foul-smelling urine darker and hematuria less communicative for 2 days prior to admission     Assessment/Plan:   Severe sepsis ([place]) due to UTI: Urine cultures and blood cultures are pending he was started empirically on IV Rocephin. Please remain afebrile with a Tmax of 99, this is improved. Physical therapy evaluation is pending.  Acute Metabolic encephalopathy: Likely due to infectious etiology resolved with IV fluids.  Acute kidney injury: In the setting of hypovolemia, ACE inhibitor and probably NSAID use. He was fluid resuscitated and his creatinine has returned to baseline. Continue to follow strict I's and O's.  Essential hypertension: Blood pressure continues to be controlled, continue Coreg and hold all other antihypertensive medication.  Diabetes mellitus type 2: A1C of 5.4 continue to monitor CBGs.  Normocytic anemia: Liver panel iron 17 ferritin of 246 follow-up with PCP as an outpatient. B12 574.    DVT prophylaxis: lovenox Family Communication:daugjhter Status is: Inpatient Remains inpatient appropriate because: Acute metabolic encephalopathy due to severe sepsis in the setting of acute kidney injury and UTI    Code Status:     Code Status Orders  (From admission, onward)           Start     Ordered   12/16/21 1908  Full code  Continuous        12/16/21 1910           Code Status History     Date Active Date Inactive Code Status Order ID Comments User Context   10/17/2020 2313  10/23/2020 2022 Full Code [id number]  Chotiner, Yevonne Aline, MD ED   06/22/2020 1514 06/29/2020 0205 Full Code [id number]  Para Skeans, MD ED   06/11/2020 1834 06/15/2020 1916 Full Code [id number]  Norins, Heinz Knuckles, MD ED      Advance Directive Documentation    Flowsheet Row Most Recent Value  Type of Advance Directive Healthcare Power of [place]  Pre-existing out of facility DNR order (yellow form or pink MOST form) --  "MOST" Form in Place? --         IV Access:   Peripheral IV   Procedures and diagnostic studies:   DG Chest 2 View  Result Date: 12/16/2021 CLINICAL DATA:  Cough.  Altered mental status. EXAM: CHEST - 2 VIEW COMPARISON:  06/22/2020 FINDINGS: Atherosclerotic calcification of the aortic arch. Heart size within normal limits for projection. Hazy lower lobe density on the lateral projection not well corroborated on the frontal view. The lungs appear otherwise clear. No blunting of the costophrenic angles. Suspected shoulder arthropathy likely with chronic rotator cuff tears. IMPRESSION: 1. Hazy density projecting over the posterior basal segments of the lower lobes on the lateral projection but not well corroborated on the frontal view. Early pneumonia or bronchopneumonia is not excluded. 2.  Aortic Atherosclerosis (ICD10-I70.0). Electronically Signed   By: Van Clines M.D.   On: 12/16/2021 11:52     Medical Consultants:   None.   Subjective:    Eduardo Moon more awake today hungry would like to have a diet.  Objective:    Vitals:   12/16/21 1828 12/16/21 2216 12/17/21 0152 12/17/21 0633  BP: (!) 141/71 135/72 (!) 143/69 (!) 119/59  Pulse: 79 72 80 78  Resp: '18 20 20 16  '$ Temp: (!) 97.5 F (36.4 C) 98.1 F (36.7 C) 99 F (37.2 C) 98.6 F (37 C)  TempSrc: Oral     SpO2: 100% 99% 96% 98%  Weight:      Height:       SpO2: 98 %   Intake/Output Summary (Last 24 hours) at 12/17/2021 0809 Last data filed at 12/17/2021 0325 Gross per 24 hour   Intake 600 ml  Output 800 ml  Net -200 ml   Filed Weights   12/16/21 1128  Weight: 73 kg    Exam: General exam: In no acute distress. Respiratory system: Good air movement and clear to auscultation. Cardiovascular system: S1 & S2 heard, RRR. No JVD. Gastrointestinal system: Abdomen is nondistended, soft and nontender.  Extremities: No pedal edema. Skin: No rashes, lesions or ulcers Psychiatry: Judgement and insight appear normal. Mood & affect appropriate.    Data Reviewed:    Labs: Basic Metabolic Panel: Recent Labs  Lab 12/16/21 1138 12/17/21 0502  NA 141 145  K 4.1 3.4*  CL 112* 117*  CO2 21* 20*  GLUCOSE 153* 95  BUN 30* 19  CREATININE 1.49* 0.97  CALCIUM 8.8* 8.5*   GFR Estimated Creatinine Clearance: 56.6 mL/min (by C-G formula based on SCr of 0.97 mg/dL). Liver Function Tests: Recent Labs  Lab 12/16/21 1138  AST 13*  ALT 32  ALKPHOS 94  BILITOT 1.1  PROT 6.9  ALBUMIN 3.3*   No results for input(s): LIPASE, AMYLASE in the last 168 hours. No results for input(s): AMMONIA in the last 168 hours. Coagulation profile No results for input(s): INR, PROTIME in the last 168 hours. COVID-19 Labs  Recent Labs    12/17/21 0502  FERRITIN 246    Lab Results  Component Value Date   SARSCOV2NAA NEGATIVE 12/16/2021   SARSCOV2NAA NEGATIVE 10/22/2020   [place] NEGATIVE 10/17/2020   [facility] NEGATIVE 06/22/2020    CBC: Recent Labs  Lab 12/16/21 1138 12/17/21 0502  WBC 13.5* 10.3  HGB 11.2* 10.5*  HCT 33.6* 32.5*  MCV 97.1 97.3  PLT 236 169   Cardiac Enzymes: No results for input(s): CKTOTAL, CKMB, CKMBINDEX, TROPONINI in the last 168 hours. BNP (last 3 results) No results for input(s): PROBNP in the last 8760 hours. CBG: Recent Labs  Lab 12/17/21 0732  GLUCAP 82   D-Dimer: No results for input(s): DDIMER in the last 72 hours. Hgb A1c: Recent Labs    12/16/21 1138  HGBA1C 5.4   Lipid Profile: No results for input(s): CHOL,  HDL, LDLCALC, TRIG, CHOLHDL, LDLDIRECT in the last 72 hours. Thyroid function studies: No results for input(s): TSH, T4TOTAL, T3FREE, THYROIDAB in the last 72 hours.  Invalid input(s): FREET3 Anemia work up: Recent Labs    12/17/21 0502  VITAMINB12 574  FOLATE 24.0  FERRITIN 246  TIBC 160*  IRON 17*  RETICCTPCT 1.4   Sepsis Labs: Recent Labs  Lab 12/16/21 1138 12/17/21 0502  WBC 13.5* 10.3   Microbiology Recent Results (from the past 240 hour(s))  Resp Panel by RT-PCR (Flu A&B, Covid) Nasopharyngeal Swab     Status: None   Collection Time: 12/16/21 12:35 PM   Specimen: Nasopharyngeal Swab; Nasopharyngeal(NP) swabs in vial transport medium  Result Value Ref Range Status   SARS Coronavirus 2 by RT PCR  NEGATIVE NEGATIVE Final    Comment: (NOTE) SARS-CoV-2 target nucleic acids are NOT DETECTED.  The SARS-CoV-2 RNA is generally detectable in upper respiratory specimens during the acute phase of infection. The lowest concentration of SARS-CoV-2 viral copies this assay can detect is 138 copies/mL. A negative result does not preclude SARS-Cov-2 infection and should not be used as the sole basis for treatment or other patient management decisions. A negative result may occur with  improper specimen collection/handling, submission of specimen other than nasopharyngeal swab, presence of viral mutation(s) within the areas targeted by this assay, and inadequate number of viral copies(<138 copies/mL). A negative result must be combined with clinical observations, patient history, and epidemiological information. The expected result is Negative.  Fact Sheet for Patients:  EntrepreneurPulse.com.au  Fact Sheet for Healthcare Providers:  IncredibleEmployment.be  This test is no t yet approved or cleared by the [place] FDA and  has been authorized for detection and/or diagnosis of SARS-CoV-2 by FDA under an Emergency Use Authorization (EUA).  This EUA will remain  in effect (meaning this test can be used) for the duration of the COVID-19 declaration under Section 564(b)(1) of the Act, 21 U.S.C.section 360bbb-3(b)(1), unless the authorization is terminated  or revoked sooner.       Influenza A by PCR NEGATIVE NEGATIVE Final   Influenza B by PCR NEGATIVE NEGATIVE Final    Comment: (NOTE) The Xpert Xpress SARS-CoV-2/FLU/RSV plus assay is intended as an aid in the diagnosis of influenza from Nasopharyngeal swab specimens and should not be used as a sole basis for treatment. Nasal washings and aspirates are unacceptable for Xpert Xpress SARS-CoV-2/FLU/RSV testing.  Fact Sheet for Patients: EntrepreneurPulse.com.au  Fact Sheet for Healthcare Providers: IncredibleEmployment.be  This test is not yet approved or cleared by the [place] FDA and has been authorized for detection and/or diagnosis of SARS-CoV-2 by FDA under an Emergency Use Authorization (EUA). This EUA will remain in effect (meaning this test can be used) for the duration of the COVID-19 declaration under Section 564(b)(1) of the Act, 21 U.S.C. section 360bbb-3(b)(1), unless the authorization is terminated or revoked.  Performed at [facility], [place] [facility] [address] [place], [place] [address]      Medications:    albuterol  2.5 mg Nebulization Q6H   allopurinol  300 mg Oral BID   enoxaparin (LOVENOX) injection  40 mg Subcutaneous Q24H   insulin aspart  0-6 Units Subcutaneous TID WC   metoprolol succinate  100 mg Oral Daily   pantoprazole  40 mg Oral Daily   polyethylene glycol  17 g Oral BID   potassium chloride  40 mEq Oral BID   rosuvastatin  5 mg Oral QPM   senna  1 tablet Oral BID   sertraline  50 mg Oral Daily   tamsulosin  0.8 mg Oral Daily   Continuous Infusions:  sodium chloride 100 mL/hr at 12/16/21 1826   cefTRIAXone (ROCEPHIN)  IV        LOS: 1 day   Charlynne Cousins  [facility]  12/17/2021, 8:09 AM

## 2021-12-17 NOTE — Progress Notes (Signed)
PT Cancellation Note  Patient Details Name: Eduardo Moon MRN: [id number] DOB: 06/22/1946   Cancelled Treatment:    Reason Eval/Treat Not Completed: PT screened, no needs identified, will sign off. Per pt and pt's daughter [name] at bedside, pt doesn't need acute PT or f/u PT. Daughter reports she is pt's caretaker at home, her son is able to transfer pt from bed to w/c or pt remains in bed pending his preference, and family has medical equipment needed. Daughter reports pt is at baseline and declines PT. Will sign off at this time; please re-consult PT if needs arise.    Talbot Grumbling PT, DPT 12/17/21, 11:53 AM

## 2021-12-17 NOTE — Progress Notes (Signed)
Initial Nutrition Assessment  DOCUMENTATION CODES:   Not applicable  INTERVENTION:   Ensure Enlive po BID, each supplement provides 350 kcal and 20 grams of protein.  MVI with minerals daily.  NUTRITION DIAGNOSIS:   Inadequate oral intake related to acute illness, poor appetite as evidenced by per patient/family report.  GOAL:   Patient will meet greater than or equal to 90% of their needs  MONITOR:   PO intake, Supplement acceptance  REASON FOR ASSESSMENT:   Malnutrition Screening Tool    ASSESSMENT:   76 yo male admitted with severe sepsis d/t UTI, acute metabolic encephalopathy, AKI. PMH includes HTN, DM-2, prostate cancer S/P radioactive seed implants 2012, weakness d/t polio, mild acid reflux.  From review of progress notes, patient is essentially bedbound.  He is more awake today and asking for food.  Diet advanced to regular this morning.  Unable to speak with patient or complete NFPE at this time.   Admission nutrition screen positive for weight loss of unknown amount and poor appetite for 2 days per daughter.  Weight history reviewed. Not significant weight changes over the past year. Most recent weight available PTA was 69.7 kg on 11/07/20.   Labs reviewed. K 3.4 CBG: 82 this AM  Medications reviewed and include Novolog, Miralax, Klor-Con, Senokot, Flomax, IV antibiotics.  Patient is at nutrition risk given recent confusion and reported weight loss. Will add PO supplements to maximize intake of protein and calories.   NUTRITION - FOCUSED PHYSICAL EXAM:  Unable to complete  Diet Order:   Diet Order             Diet regular Room service appropriate? Yes; Fluid consistency: Thin  Diet effective now                   EDUCATION NEEDS:   No education needs have been identified at this time  Skin:  Skin Assessment: Reviewed RN Assessment  Last BM:  no BM documented  Height:   Ht Readings from Last 1 Encounters:  12/16/21 '5\' 6"'$  (1.676 m)     Weight:   Wt Readings from Last 1 Encounters:  12/16/21 73 kg    Ideal Body Weight:  64.5 kg  BMI:  Body mass index is 25.99 kg/m.  Estimated Nutritional Needs:   Kcal:  1900-2100  Protein:  90-105 gm  Fluid:  1.9-2.1 L    Lucas Mallow RD, LDN, CNSC Please refer to Amion for contact information.

## 2021-12-18 DIAGNOSIS — R6520 Severe sepsis without septic shock: Secondary | ICD-10-CM | POA: Diagnosis not present

## 2021-12-18 DIAGNOSIS — A419 Sepsis, unspecified organism: Secondary | ICD-10-CM | POA: Diagnosis not present

## 2021-12-18 LAB — GLUCOSE, CAPILLARY
Glucose-Capillary: 88 mg/dL (ref 70–99)
Glucose-Capillary: 121 mg/dL — ABNORMAL HIGH (ref 70–99)
Glucose-Capillary: 119 mg/dL — ABNORMAL HIGH (ref 70–99)
Glucose-Capillary: 152 mg/dL — ABNORMAL HIGH (ref 70–99)

## 2021-12-18 MED ORDER — ALBUTEROL SULFATE (2.5 MG/3ML) 0.083% IN NEBU
2.5000 mg | INHALATION_SOLUTION | Freq: Three times a day (TID) | RESPIRATORY_TRACT | Status: DC
Start: 2021-12-19 — End: 2021-12-19
  Administered 2021-12-19 (×3): 2.5 mg via RESPIRATORY_TRACT
  Filled 2021-12-18 (×3): qty 3

## 2021-12-18 NOTE — Plan of Care (Signed)

## 2021-12-18 NOTE — Progress Notes (Signed)
PROGRESS NOTE    Eduardo Moon  OIB:[id number] DOB: 04-Feb-1946 DOA: 12/16/2021 PCP: Girtha Rm, NP-C     Brief Narrative:   Eduardo Moon is an 76 y.o. male past medical history of polio essentially bedbound, essential hypertension diabetes mellitus type 2 with history of prostate cancer since 2012 post radiation seeding with a recent upper respiratory tract infection is brought in by his caregiver for confusion foul-smelling urine darker and hematuria less communicative for 2 days prior to admission  Subjective:  Reports feeling better, denies pain, aaox3, he did not recall he had bladder infection in the past Urine culture in process  Assessment & Plan:  Principal Problem:   Severe sepsis ([place]) Active Problems:   HTN (hypertension)   DM2 (diabetes mellitus, type 2) ([place])   AKI (acute kidney injury) ([place])   Sepsis secondary to UTI ([place])   Acute metabolic encephalopathy    Assessment and Plan:   Severe sepsis ([place]) due to UTI: Urine cultures and blood cultures are pending he was started empirically on IV Rocephin. Please remain afebrile with a Tmax of 99, this is improved. Physical therapy evaluation is pending.  Acute Metabolic encephalopathy: Likely due to infectious etiology resolved with IV fluids.  Acute kidney injury: In the setting of hypovolemia, ACE inhibitor and probably NSAID use. He was fluid resuscitated and his creatinine has returned to baseline. Continue to follow strict I's and O's.  Essential hypertension: Blood pressure continues to be controlled, continue Coreg and hold all other antihypertensive medication.  Diabetes mellitus type 2: A1C of 5.4 continue to monitor CBGs.  Normocytic anemia: Liver panel iron 17 ferritin of 246 follow-up with PCP as an outpatient. B12 574.     Nutritional Assessment: The patient's BMI is: Body mass index is 25.99 kg/m.Marland Kitchen Seen by dietician.  I agree with the assessment and plan as outlined  below: Nutrition Status: Nutrition Problem: Inadequate oral intake Etiology: acute illness, poor appetite Signs/Symptoms: per patient/family report Interventions: Ensure Enlive (each supplement provides 350kcal and 20 grams of protein), MVI  .  I have Reviewed nursing notes, Vitals, pain scores, I/o's, Lab results and  imaging results since pt's last encounter, details please see discussion above  I ordered the following labs:  Unresulted Labs (From admission, onward)     Start     Ordered   12/23/21 0500  Creatinine, serum  (enoxaparin (LOVENOX)    CrCl >/= 30 ml/min)  Weekly,   R     Comments: while on enoxaparin therapy    12/16/21 1910   12/19/21 9450  Basic metabolic panel  Tomorrow morning,   R       Question:  Specimen collection method  Answer:  Lab=Lab collect   12/18/21 1822   12/19/21 0500  Magnesium  Tomorrow morning,   R       Question:  Specimen collection method  Answer:  Lab=Lab collect   12/18/21 1822             DVT prophylaxis: enoxaparin (LOVENOX) injection 40 mg Start: 12/16/21 2200   Code Status:   Code Status: Full Code  Family Communication: patient Disposition:   Status is: Inpatient     Dispo: The patient is from: home, bedbound              Anticipated d/c is to: home              Anticipated d/c date is: awaiting for urine culture  Antimicrobials:    Anti-infectives (From  admission, onward)    Start     Dose/Rate Route Frequency Ordered Stop   12/17/21 1700  cefTRIAXone (ROCEPHIN) 1 g in sodium chloride 0.9 % 100 mL IVPB        1 g 200 mL/hr over 30 Minutes Intravenous Every 24 hours 12/16/21 2305     12/16/21 1630  cefTRIAXone (ROCEPHIN) 1 g in sodium chloride 0.9 % 100 mL IVPB        1 g 200 mL/hr over 30 Minutes Intravenous  Once 12/16/21 1628 12/16/21 1746          Objective: Vitals:   12/17/21 1932 12/18/21 0322 12/18/21 0818 12/18/21 1434  BP: 125/64 138/71  (!) 142/75  Pulse: 86 86  73  Resp: '15 15  20  '$ Temp:  (!) 97.4 F (36.3 C) 98.2 F (36.8 C)  98.8 F (37.1 C)  TempSrc:  Oral  Oral  SpO2: 99% 100% 100% 100%  Weight:      Height:        Intake/Output Summary (Last 24 hours) at 12/18/2021 1823 Last data filed at 12/18/2021 1724 Gross per 24 hour  Intake 3281.7 ml  Output 2401 ml  Net 880.7 ml   Filed Weights   12/16/21 1128  Weight: 73 kg    Examination:  General exam: Appear weak, AAOx3 Respiratory system: Clear to auscultation. Respiratory effort normal. Cardiovascular system:  RRR.  Gastrointestinal system: Abdomen is nondistended, soft and nontender.  Normal bowel sounds heard. Central nervous system: Alert and oriented. No focal neurological deficits. Extremities:  bilateral foot deformity, left pedal edema ( reports chronic) , intention tremor bilateral upper extremity, left worse than right  Skin: No rashes, lesions or ulcers Psychiatry: calm and cooperative    Data Reviewed: I have personally reviewed  labs and visualized  imaging studies since the last encounter and formulate the plan        Scheduled Meds:  albuterol  2.5 mg Nebulization Q6H   allopurinol  300 mg Oral BID   enoxaparin (LOVENOX) injection  40 mg Subcutaneous Q24H   feeding supplement  237 mL Oral BID BM   insulin aspart  0-6 Units Subcutaneous TID WC   metoprolol succinate  100 mg Oral Daily   multivitamin with minerals  1 tablet Oral Daily   pantoprazole  40 mg Oral Daily   polyethylene glycol  17 g Oral BID   rosuvastatin  5 mg Oral QPM   senna  1 tablet Oral BID   sertraline  50 mg Oral Daily   tamsulosin  0.8 mg Oral Daily   Continuous Infusions:  cefTRIAXone (ROCEPHIN)  IV 1 g (12/18/21 1720)     LOS: 2 days      Florencia Reasons, MD PhD FACP [facility]  Available via Epic secure chat 7am-7pm for nonurgent issues Please page for urgent issues To page the attending provider between 7A-7P or the covering provider during after hours 7P-7A, please log into the web site  [email or web address] and access using universal [place] password for that web site. If you do not have the password, please call the hospital operator.    12/18/2021, 6:23 PM

## 2021-12-19 ENCOUNTER — Inpatient Hospital Stay (HOSPITAL_COMMUNITY): Payer: Medicare Other

## 2021-12-19 DIAGNOSIS — R6520 Severe sepsis without septic shock: Secondary | ICD-10-CM

## 2021-12-19 DIAGNOSIS — A419 Sepsis, unspecified organism: Secondary | ICD-10-CM

## 2021-12-19 DIAGNOSIS — R609 Edema, unspecified: Secondary | ICD-10-CM

## 2021-12-19 LAB — GLUCOSE, CAPILLARY
Glucose-Capillary: 122 mg/dL — ABNORMAL HIGH (ref 70–99)
Glucose-Capillary: 132 mg/dL — ABNORMAL HIGH (ref 70–99)
Glucose-Capillary: 155 mg/dL — ABNORMAL HIGH (ref 70–99)

## 2021-12-19 LAB — BASIC METABOLIC PANEL
Anion gap: 4 — ABNORMAL LOW (ref 5–15)
BUN: 19 mg/dL (ref 8–23)
CO2: 24 mmol/L (ref 22–32)
Calcium: 8.6 mg/dL — ABNORMAL LOW (ref 8.9–10.3)
Chloride: 111 mmol/L (ref 98–111)
Creatinine, Ser: 0.99 mg/dL (ref 0.61–1.24)
GFR, Estimated: >60 mL/min (ref >60)
Glucose, Bld: 104 mg/dL — ABNORMAL HIGH (ref 70–99)
Potassium: 4 mmol/L (ref 3.5–5.1)
Sodium: 139 mmol/L (ref 135–145)

## 2021-12-19 LAB — URINE CULTURE: Culture: >=100000 — AB

## 2021-12-19 LAB — MAGNESIUM: Magnesium: 1.8 mg/dL (ref 1.7–2.4)

## 2021-12-19 MED ORDER — CEFADROXIL 500 MG PO CAPS
500.0000 mg | ORAL_CAPSULE | Freq: Two times a day (BID) | ORAL | Status: DC
  Administered 2021-12-19 – 2021-12-20 (×2): 500 mg via ORAL
  Filled 2021-12-19 (×2): qty 1

## 2021-12-19 MED ORDER — ALBUTEROL SULFATE (2.5 MG/3ML) 0.083% IN NEBU: 2.5000 mg | INHALATION_SOLUTION | Freq: Four times a day (QID) | RESPIRATORY_TRACT | Status: DC | PRN

## 2021-12-19 NOTE — Progress Notes (Signed)
Occupational Therapy Treatment and Discharge Note Patient Details Name: Eduardo Moon MRN: [id number] DOB: 05-Sep-1945 Today's Date: 12/19/2021   History of present illness Patient is a 76yo male admitted with dark urine, hematuria and confusion.  Pt has h/o polio and has been bedbound for 2 years, prostate CA, DMII and recent upper respiratory infection.   OT comments  Pt was evaluated by OT while he was still NPO with goal of resuming self-feeding.  Today, pt able to demonstrate manipulation of feeding utensils in Bilateral hands without effort, per pt report. Pt able to demonstrate bring fork to mouth without need of assistance. Pt also performed oral hygiene with ability to open toothpaste and need of assistance to replace toothpaste cap, and able to complete tasks with setup only.  Pt tolerated over 25 min with bed in chair position. Unfortunately, per chart review it seems pt's wheelchair has not been located yet and goal of sitting in wheelchair is unable to be addressed as pt unsafe for recliner. Pt was educated to keep bed in chair position as long as tolerated throughout the day in order to prepare for return to [facility] once found. At this time, pt is demonstrating his self care at baseline and skilled OT to sign off.    Recommendations for follow up therapy are one component of a multi-disciplinary discharge planning process, led by the attending physician.  Recommendations may be updated based on patient status, additional functional criteria and insurance authorization.    Follow Up Recommendations  No OT follow up    Assistance Recommended at Discharge Frequent or constant Supervision/Assistance  Patient can return home with the following  A lot of help with walking and/or transfers;A lot of help with bathing/dressing/bathroom;Assist for transportation;Help with stairs or ramp for entrance;Assistance with cooking/housework   Equipment Recommendations  None recommended by OT     Recommendations for Other Services      Precautions / Restrictions Precautions Precautions: Fall Restrictions Weight Bearing Restrictions: No       Mobility Bed Mobility                    Transfers                         Balance                                           ADL either performed or assessed with clinical judgement   ADL   Eating/Feeding: Bed level;Set up (Bed in chair position)   Grooming: Wash/dry face;Oral care;Bed level (bed in chair position)                                 General ADL Comments: Pt tolerated bed set to chair level position x25 min and several min after OT completed for simulated feeding as well as grooming tasks. Pt able to hold fork and knike in each hand and demonstrate simulation of cutting food and bring utensils to mouth without effort per pt report. No need for adaptive handles on utensils.    Extremity/Trunk Assessment              Vision Patient Visual Report: No change from baseline Vision Assessment?: No apparent visual deficits   Perception     Praxis  Cognition Arousal/Alertness: Awake/alert Behavior During Therapy: WFL for tasks assessed/performed Overall Cognitive Status: Within Functional Limits for tasks assessed                                 General Comments: Pleasant and cooperative        Exercises Other Exercises Other Exercises: Pt provided with stacking medicine cups and shown how to work on reach, grasp, cross midline and place on bedside table to keep UEs limber and preventing stiffness from prolonged time in bed. Other Exercises: Pt performed 10 reps open/cose hands and wrist ext/flexion at bed level.    Shoulder Instructions       General Comments      Pertinent Vitals/ Pain       Pain Assessment Pain Assessment: No/denies pain  Home Living                                          Prior  Functioning/Environment              Frequency  Other (comment) (OT to sign off as pt at baseline)        Progress Toward Goals  OT Goals(current goals can now be found in the care plan section)  Progress towards OT goals: Goals met/education completed, patient discharged from OT  Acute Rehab OT Goals OT Goal Formulation: All assessment and education complete, DC therapy  Plan Discharge plan remains appropriate    Co-evaluation                 AM-PAC OT "6 Clicks" Daily Activity     Outcome Measure   Help from another person eating meals?: A Little Help from another person taking care of personal grooming?: A Little Help from another person toileting, which includes using toliet, bedpan, or urinal?: Total Help from another person bathing (including washing, rinsing, drying)?: A Lot Help from another person to put on and taking off regular upper body clothing?: A Lot Help from another person to put on and taking off regular lower body clothing?: Total 6 Click Score: 12    End of Session    OT Visit Diagnosis: Other abnormalities of gait and mobility (R26.89)   Activity Tolerance Patient tolerated treatment well   Patient Left in bed;with call bell/phone within reach;with bed alarm set   Nurse Communication Other (comment) (Pt at baseline. PT already signed off.)        Time: 1141-1206 OT Time Calculation (min): 25 min  Charges: OT General Charges $OT Visit: 1 Visit OT Treatments $Self Care/Home Management : 23-37 mins  Anderson Malta, OT Acute Rehab Services Office: [phone] 12/19/2021  Julien Girt 12/19/2021, 1:31 PM

## 2021-12-19 NOTE — Plan of Care (Signed)

## 2021-12-19 NOTE — Progress Notes (Signed)
LLE venous duplex has been completed.   Results can be found under chart review under CV PROC. 12/19/2021 11:28 AM Cauy Melody RVT, RDMS

## 2021-12-19 NOTE — Progress Notes (Signed)
CPT held at this time due to meal intake.

## 2021-12-19 NOTE — Consult Note (Signed)
PODIATRY CONSULTATION  NAME Eduardo Moon MRN [id number] DOB [birth year] DOA 12/16/2021   Reason for consult: Edema LT foot.  Wound left hallux. Chief Complaint  Patient presents with   Altered Mental Status   Urinary Tract Infection   Cough    Consulting physician: Florencia Reasons MD  History of present illness: 76 y.o. male PMHx polio bedbound admitted 12/16/2021 for AMS with foul-smelling urine and hematuria.  Upon evaluation today it was noted that his left foot was edematous.  Daughter relates a history of loss of the left hallux toenail.  They have been applying a Band-Aid to the area.  Unaware when the toenail came off.  X-rays taken podiatry consulted.  Past Medical History:  Diagnosis Date   Arthritis    History of prostate cancer s/p radiactive seed implants  03-24-2011   Hypertension    Mild acid reflux watches diet   Polio age 16   Polio    Scrotal abscess    Weakness 05/2020   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   uses w/c for distance       Latest Ref Rng & Units 12/17/2021    5:02 AM 12/16/2021   11:38 AM 10/23/2020    5:00 AM  CBC  WBC 4.0 - 10.5 K/uL 10.3   13.5   11.4    Hemoglobin 13.0 - 17.0 g/dL 10.5   11.2   10.0    Hematocrit 39.0 - 52.0 % 32.5   33.6   31.6    Platelets 150 - 400 K/uL 169   236   281         Latest Ref Rng & Units 12/19/2021    5:19 AM 12/17/2021    5:02 AM 12/16/2021   11:38 AM  BMP  Glucose 70 - 99 mg/dL 104   95   153    BUN 8 - 23 mg/dL '19   19   30    '$ Creatinine 0.61 - 1.24 mg/dL 0.99   0.97   1.49    Sodium 135 - 145 mmol/L 139   145   141    Potassium 3.5 - 5.1 mmol/L 4.0   3.4   4.1    Chloride 98 - 111 mmol/L 111   117   112    CO2 22 - 32 mmol/L '24   20   21    '$ Calcium 8.9 - 10.3 mg/dL 8.6   8.5   8.8         Physical Exam: General: The patient is alert and oriented x3 in no acute distress.   Dermatology: Loss of the left hallux nail plate with good routine healing.  No drainage.  No malodor.  No erythema noted to the  toe.  Skin is cool to touch.  Vascular: Diffuse pitting edema noted clinically seems to be unrelated to cellulitis or underlying infection.  Skin cool to touch.  Neurological: Light touch and protective threshold diminished bilaterally.   Musculoskeletal Exam: Nonambulatory.  Wheelchair/bedbound.    ASSESSMENT/PLAN OF CARE Loss of toenail left hallux -Appears to be healing routinely.  No drainage.  Well-healing nail bed.  Edema left foot -Clinically this does not appear to be related to underlying cellulitis or infection. -Recommend compression hose or Ace wraps  -Stable from a podiatry standpoint.  Podiatry to sign off     Thank you for the consult.    Edrick Kins, DPM [facility]  Dr. Edrick Kins, DPM  [address], [place] [address]                Office [phone]  Fax [phone]

## 2021-12-19 NOTE — Care Management Important Message (Signed)
Important Message  Patient Details IM Letter given to the Patient. Name: Eduardo Moon MRN: [id number] Date of Birth: Feb 05, 1946   Medicare Important Message Given:  Yes     Kerin Salen 12/19/2021, 10:03 AM

## 2021-12-19 NOTE — Progress Notes (Signed)
PT refuses CPT at this time.

## 2021-12-19 NOTE — TOC Initial Note (Addendum)
Transition of Care [facility]) - Initial/Assessment Note    Patient Details  Name: Eduardo Moon MRN: [id number] Date of Birth: [birth year]  Transition of Care [facility]) CM/SW Contact:    Bethann Berkshire, [place] Phone Number: 12/19/2021, 11:14 AM  Clinical Narrative:                   CSW is notified by RN that pt daughter requesting to speak with CSW. CSW met with daughter at bedside. Daughter is pt's caretaker at home and has a son who also helps. She ask questions about external catheter with suctions for home use; CSW explained suction machines are not covered by insurance for home use. She also inquires about nebulizer treatments at home; CSW directed her to MD. [place] notified MD that daughter requesting home nebulizer. She also informed CSW that pt would need PTAR transport home to address [place]. [place].    Expected Discharge Plan: Home/Self Care Barriers to Discharge: Continued Medical Work up   Patient Goals and CMS Choice        Expected Discharge Plan and Services Expected Discharge Plan: Home/Self Care       Living arrangements for the past 2 months: Single Family Home                                      Prior Living Arrangements/Services Living arrangements for the past 2 months: [place] with:: Adult Children                   Activities of Daily Living Home Assistive Devices/Equipment: Hospital bed, Wheelchair ADL Screening (condition at time of admission) Patient's cognitive ability adequate to safely complete daily activities?: Yes Is the patient deaf or have difficulty hearing?: No Does the patient have difficulty seeing, even when wearing glasses/contacts?: No Does the patient have difficulty concentrating, remembering, or making decisions?: Yes Patient able to express need for assistance with ADLs?: No Does the patient have difficulty dressing or bathing?: Yes Independently performs ADLs?: No Communication:  Independent Dressing (OT): Needs assistance Grooming: Needs assistance Is this a change from baseline?: Pre-admission baseline Feeding: Needs assistance Is this a change from baseline?: Change from baseline, expected to last >3 days Bathing: Needs assistance Is this a change from baseline?: Pre-admission baseline Toileting: Needs assistance Is this a change from baseline?: Pre-admission baseline In/Out Bed: Needs assistance Is this a change from baseline?: Pre-admission baseline Walks in Home: Dependent Is this a change from baseline?: Pre-admission baseline Does the patient have difficulty walking or climbing stairs?: Yes Weakness of Legs: Both Weakness of Arms/Hands: Both  Permission Sought/Granted                  Emotional Assessment Appearance:: Appears stated age Attitude/Demeanor/Rapport: Gracious Affect (typically observed): Quiet Orientation: : Oriented to Self, Oriented to Place Alcohol / Substance Use: Not Applicable Psych Involvement: No (comment)  Admission diagnosis:  Acute cystitis with hematuria [N30.01] Severe sepsis ([place]) [A41.9, R65.20] Patient Active Problem List   Diagnosis Date Noted   Severe sepsis ([place]) 12/16/2021   Sepsis secondary to UTI ([place]) 82/95/6213   Acute metabolic encephalopathy 08/65/7846   Left scapholunate ligament tear 10/29/2020   Proctitis 10/17/2020   Hypomagnesemia 10/17/2020   Essential hypertension 10/17/2020   Diarrhea 10/17/2020   Anemia 06/22/2020   AKI (acute kidney injury) ([place]) 06/22/2020   Bradycardia 06/22/2020   Hypokalemia 06/22/2020  Leukocytosis (leucocytosis) 06/22/2020   Limited mobility 06/22/2020   Knee pain 06/22/2020   HTN (hypertension) 06/11/2020   Gout attack 06/11/2020   Acute lower UTI 06/11/2020   Weakness 06/11/2020   UTI (urinary tract infection) 06/11/2020   DM2 (diabetes mellitus, type 2) ([place]) 06/11/2020   HLD (hyperlipidemia) 06/11/2020   Acute prerenal azotemia 06/11/2020    Epididymo-orchitis, acute 05/07/2012   Scrotal abscess 05/07/2012   PCP:  Girtha Rm, NP-C Pharmacy:   [id number]: [phone]: [address]: [phone]: [phone] [facility] [address] [id number]: [phone]: [phone]    Social Determinants of Health (SDOH) Interventions    Readmission Risk Interventions    06/26/2020    2:12 PM 06/13/2020    3:56 PM  Readmission Risk Prevention Plan  Post Dischage Appt  Complete  Medication Screening  Complete  Transportation Screening Complete Complete  PCP or Specialist Appt within 3-5 Days Complete   HRI or H[place]ulah[place]   Social Work Consult for [place]. ClairPlanning/Counseling Complete   Palliative Care Screening Not Applicable   Medication Review (Press photographer Complete

## 2021-12-19 NOTE — Progress Notes (Signed)
PROGRESS NOTE    Eduardo Moon  PIR:[id number] DOB: 27-Jul-1946 DOA: 12/16/2021 PCP: Girtha Rm, NP-C     Brief Narrative:   Eduardo Moon is an 76 y.o. male past medical history of polio essentially bedbound, essential hypertension diabetes mellitus type 2 with history of prostate cancer since 2012 post radiation seeding with a recent upper respiratory tract infection is brought in by his caregiver for confusion foul-smelling urine darker and hematuria less communicative for 2 days prior to admission  Subjective:  No acute interval event Daughter states patient is getting better Daughter is concerned about his left foot being edematous    Assessment & Plan:  Principal Problem:   Severe sepsis ([place]) Active Problems:   HTN (hypertension)   DM2 (diabetes mellitus, type 2) ([place])   AKI (acute kidney injury) ([place])   Sepsis secondary to UTI ([place])   Acute metabolic encephalopathy    Assessment and Plan:   Severe sepsis /acute metabolic encephalopathy ([place]) due to UTI, POA: Blood culture no growth, urine culture grow pansensitive E. coli Was on IV Rocephin, changed to oral antibiotics, plan total 7 days of antibiotic treatment Improved, daughter stated patient mental status now back to baseline  Daughter report patient had frequent UTI, total of 6 UTIs since he was bedbound for last 2 years Postvoid residual is 0, however he reported 900 cc at 1 time per record I wonder he has some underlying neurogenic bladder Consider timed voiding Continue Flomax Close urology follow-up    Acute kidney injury, POA: In the setting of UTI sepsis ,hypovolemia, ACE inhibitor and probably NSAID use.  his creatinine has returned to normal  Left foot edema, daughter reports this is new Venous doppler no DVT, Discussed  foot x ray with ortho who does not recommend surgical intervention, ortho recommended podiatrist consult Seen by podiatrist Dr Amalia Hailey who does not think any associated  infection, recommend conservative management   Essential hypertension: Home medication Norvasc lisinopril held since admission Is continued on metoprolol, BP stable on current regimen  Prior h/o noninsulin-dependent diabetes mellitus type 2: Appear to be on metformin briefly in 2021 A1C of 5.4  Cbg stable ion regular diet   Normocytic anemia: Hgb above 10 No overt blood loss B12 574.  iron 17,  saturation 11%, start oral iron and pcp follow up.  H/o gout Stable on current regimen  FTT/Bedbound for the last two years/ daughter also concerns patient might developing dementia, as his memory has declined for the last two years as well Daughter confirmed full code status, will benefit from outpatient palliative care follow-up      Nutritional Assessment: The patient's BMI is: Body mass index is 25.99 kg/m.Mar[place] Kitchen Seen by dietician.  I agree with the assessment and plan as outlined below: Nutrition Status: Nutrition Problem: Inadequate oral intake Etiology: acute illness, poor appetite Signs/Symptoms: per patient/family report Interventions: Ensure Enlive (each supplement provides 350kcal and 20 grams of protein), MVI  .  I have Reviewed nursing notes, Vitals, pain scores, I/o's, Lab results and  imaging results since pt's last encounter, details please see discussion above  I ordered the following labs:  Unresulted Labs (From admission, onward)     Start     Ordered   12/23/21 0500  Creatinine, serum  (enoxaparin (LOVENOX)    CrCl >/= 30 ml/min)  Weekly,   R     Comments: while on enoxaparin therapy    12/16/21 1910   12/20/21 0500  Uric acid  Tomorrow morning,  R       Question:  Specimen collection method  Answer:  Lab=Lab collect   12/19/21 1658             DVT prophylaxis: enoxaparin (LOVENOX) injection 40 mg Start: 12/16/21 2200   Code Status:   Code Status: Full Code  Family Communication: patient Disposition:   Status is: Inpatient     Dispo: The  patient is from: home, bedbound              Anticipated d/c is to: home              Anticipated d/c date is: awaiting for urine culture  Antimicrobials:    Anti-infectives (From admission, onward)    Start     Dose/Rate Route Frequency Ordered Stop   12/19/21 1800  cefadroxil (DURICEF) capsule 500 mg        500 mg Oral 2 times daily 12/19/21 1249 12/23/21 0959   12/17/21 1700  cefTRIAXone (ROCEPHIN) 1 g in sodium chloride 0.9 % 100 mL IVPB  Status:  Discontinued        1 g 200 mL/hr over 30 Minutes Intravenous Every 24 hours 12/16/21 2305 12/19/21 1249   12/16/21 1630  cefTRIAXone (ROCEPHIN) 1 g in sodium chloride 0.9 % 100 mL IVPB        1 g 200 mL/hr over 30 Minutes Intravenous  Once 12/16/21 1628 12/16/21 1746          Objective: Vitals:   12/19/21 0407 12/19/21 0837 12/19/21 1321 12/19/21 1418  BP: (!) 142/63   119/64  Pulse: 76  71 73  Resp: 18   16  Temp: 98.3 F (36.8 C)   98.2 F (36.8 C)  TempSrc: Oral   Oral  SpO2: 99% 99% 94% 97%  Weight:      Height:        Intake/Output Summary (Last 24 hours) at 12/19/2021 1845 Last data filed at 12/19/2021 1140 Gross per 24 hour  Intake 100 ml  Output 3550 ml  Net -3450 ml   Filed Weights   12/16/21 1128  Weight: 73 kg    Examination:  General exam: Appear weak, AAOx3 Respiratory system: Clear to auscultation. Respiratory effort normal. Cardiovascular system:  RRR.  Gastrointestinal system: Abdomen is nondistended, soft and nontender.  Normal bowel sounds heard. Central nervous system: Alert and oriented. No focal neurological deficits. Extremities:  bilateral foot deformity, left pedal edema ( patient reports chronic, daughter reports is new ) , intention tremor bilateral upper extremity, left worse than right  Skin: No rashes, lesions or ulcers Psychiatry: calm and cooperative    Data Reviewed: I have personally reviewed  labs and visualized  imaging studies since the last encounter and formulate the  plan        Scheduled Meds:  albuterol  2.5 mg Nebulization TID   allopurinol  300 mg Oral BID   cefadroxil  500 mg Oral BID   enoxaparin (LOVENOX) injection  40 mg Subcutaneous Q24H   feeding supplement  237 mL Oral BID BM   insulin aspart  0-6 Units Subcutaneous TID WC   metoprolol succinate  100 mg Oral Daily   multivitamin with minerals  1 tablet Oral Daily   pantoprazole  40 mg Oral Daily   polyethylene glycol  17 g Oral BID   rosuvastatin  5 mg Oral QPM   senna  1 tablet Oral BID   sertraline  50 mg Oral Daily   tamsulosin  0.8 mg Oral Daily   Continuous Infusions:     LOS: 3 days      Florencia Reasons, MD PhD FACP [facility]  Available via Epic secure chat 7am-7pm for nonurgent issues Please page for urgent issues To page the attending provider between 7A-7P or the covering provider during after hours 7P-7A, please log into the web site [email or web address] and access using universal Powers password for that web site. If you do not have the password, please call the hospital operator.    12/19/2021, 6:45 PM

## 2021-12-20 DIAGNOSIS — A419 Sepsis, unspecified organism: Secondary | ICD-10-CM | POA: Diagnosis not present

## 2021-12-20 DIAGNOSIS — R6520 Severe sepsis without septic shock: Secondary | ICD-10-CM | POA: Diagnosis not present

## 2021-12-20 LAB — GLUCOSE, CAPILLARY
Glucose-Capillary: 88 mg/dL (ref 70–99)
Glucose-Capillary: 164 mg/dL — ABNORMAL HIGH (ref 70–99)

## 2021-12-20 LAB — URIC ACID: Uric Acid, Serum: <1.5 mg/dL — ABNORMAL LOW (ref 3.7–8.6)

## 2021-12-20 MED ORDER — CEFADROXIL 500 MG PO CAPS: 500.0000 mg | ORAL_CAPSULE | Freq: Two times a day (BID) | ORAL | 0 refills | Status: AC

## 2021-12-20 MED ORDER — ALBUTEROL SULFATE HFA 108 (90 BASE) MCG/ACT IN AERS: 2.0000 | INHALATION_SPRAY | Freq: Four times a day (QID) | RESPIRATORY_TRACT | 2 refills | Status: DC | PRN

## 2021-12-20 MED ORDER — HYDROCODONE-ACETAMINOPHEN 7.5-325 MG PO TABS: 1.0000 | ORAL_TABLET | Freq: Every day | ORAL | 0 refills | Status: DC | PRN

## 2021-12-20 MED ORDER — DIAZEPAM 5 MG PO TABS: 5.0000 mg | ORAL_TABLET | Freq: Three times a day (TID) | ORAL | 0 refills | Status: DC | PRN

## 2021-12-20 MED ORDER — AMLODIPINE BESYLATE 5 MG PO TABS: 5.0000 mg | ORAL_TABLET | Freq: Every day | ORAL | 0 refills | Status: DC

## 2021-12-20 MED ORDER — VITAMIN D (ERGOCALCIFEROL) 1.25 MG (50000 UNIT) PO CAPS: 50000.0000 [IU] | ORAL_CAPSULE | ORAL | 0 refills | Status: DC

## 2021-12-20 MED ORDER — SERTRALINE HCL 50 MG PO TABS: 50.0000 mg | ORAL_TABLET | Freq: Every day | ORAL | 0 refills | Status: DC

## 2021-12-20 MED ORDER — DICLOFENAC SODIUM ER 100 MG PO TB24: 100.0000 mg | ORAL_TABLET | Freq: Every day | ORAL | 0 refills | Status: DC

## 2021-12-20 MED ORDER — ALLOPURINOL 300 MG PO TABS: 300.0000 mg | ORAL_TABLET | Freq: Two times a day (BID) | ORAL | 0 refills | Status: DC

## 2021-12-20 MED ORDER — METOPROLOL SUCCINATE ER 100 MG PO TB24: 100.0000 mg | ORAL_TABLET | Freq: Every day | ORAL | 0 refills | Status: DC

## 2021-12-20 MED ORDER — TAMSULOSIN HCL 0.4 MG PO CAPS
0.4000 mg | ORAL_CAPSULE | Freq: Every day | ORAL | 0 refills | Status: AC
Start: 2021-12-20 — End: ?

## 2021-12-20 MED ORDER — ALBUTEROL SULFATE (2.5 MG/3ML) 0.083% IN NEBU: 2.5000 mg | INHALATION_SOLUTION | Freq: Four times a day (QID) | RESPIRATORY_TRACT | 1 refills | Status: DC | PRN

## 2021-12-20 NOTE — Progress Notes (Signed)
Discharged home per order. Discharge instructions reviewed with pt, voiced understanding. Copy of instructions given to pt. Iv d/c. All personal belongings sent with pt and family.

## 2021-12-20 NOTE — Discharge Summary (Signed)
Discharge Summary  Eduardo Moon LMB:[id number] DOB: Dec 09, 1945  PCP: Eduardo Rm, NP-C  Admit date: 12/16/2021 Discharge date: 12/20/2021  Time spent: 60mns, more than 50% time spent on coordination of care.   Recommendations for Outpatient Follow-up:  F/u with PCP within a week  for hospital discharge follow up, repeat cbc/bmp at follow up F/u with urology as scheduled Family declined snf   Discharge Diagnoses:  Active Hospital Problems   Diagnosis Date Noted   Severe sepsis ([place] 12/16/2021   HTN (hypertension) 06/11/2020    Priority: High   DM2 (diabetes mellitus, type 2) ([place] 06/11/2020    Priority: Low   Sepsis secondary to UTI ([facility] 010/01/1218  Acute metabolic encephalopathy 075/88/3254  AKI (acute kidney injury) ([facility] 06/22/2020    Resolved Hospital Problems  No resolved problems to display.    Discharge Condition: stable  Diet recommendation: heart healthy  Filed Weights   12/16/21 1128  Weight: 73 kg    History of present illness: ( per admitting Md Dr FAileen Fass Chief Complaint: Confusion   HPI: [name] a 76y.o. 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  She relates has been having darker urine with foul-smelling and hematuria this morning he has been more confused over the last 2 or 3 days less communicative with significant decreased oral intake.   In the ED: Was found to be afebrile vitals are stable, cell count of 13 urine appears to be infected with positive nitrates large leukocytes, he was started on IV Rocephin SARS-CoV-2 and influenza PCR negative.    Hospital Course:  Principal Problem:   Severe sepsis ([facility] Active Problems:   HTN (hypertension)   DM2 (diabetes  mellitus, type 2) ([facility]   AK[place] (acute kidney injury) ([place]   Sepsis secondary to UTI ([place]   Acute metabolic encephalopathy   Assessment and Plan:   Severe sepsis /acute metabolic encephalopathy (HCC) due to Ecoli UTI, POA: Blood culture no growth, urine culture grow pansensitive E. coli Was on IV Rocephin, changed to oral antibiotics, plan total 7 days of antibiotic treatment Improved, daughter stated patient mental status now back to baseline   Daughter report patient had frequent UTI, total of 6 UTIs since he was bedbound for last 2 years Postvoid residual is 0, however he reported 900 cc at 1 time per record I wonder he has some underlying neurogenic bladder Consider timed voiding Continue Flomax Close urology follow-up    Acute kidney injury, POA: In the setting of UTI sepsis ,hypovolemia, ACE inhibitor and probably NSAID use.  his creatinine has returned to normal F/u with pcp   Left foot edema, daughter reports this is new Venous doppler no DVT, Discussed  foot x ray with ortho who does not recommend surgical intervention, ortho recommended podiatrist consult Seen by podiatrist Dr EAmalia Haileywho does not think any associated infection, recommend conservative management, elevate extremity and compression stocking    Essential hypertension: Home medication Norvasc lisinopril held in the hospital due to sepsis Resume metoprolol and lisinopril, hold norvasc for now Patient is to have blood pressure checked at home and bring in record for pcp to review, further bp meds adjustment per pcp   Prior h/o noninsulin-dependent diabetes mellitus type 2: Appear to be on metformin  briefly in 2021 A1C of 5.4  Cbg stable ion regular diet  Daughter states patient does not have diabetes   Normocytic anemia: Hgb above 10 No overt blood loss B12 574.  iron 17,  saturation 11%,  pcp follow up, pcp to decide on iron supplement    H/o gout Stable on current regimen Uric acid less than  1.5   FTT/Bedbound for the last two years/ daughter also concerns patient might developing dementia, as his memory has declined for the last two years as well Daughter confirmed full code status, will benefit from outpatient palliative care follow-up, defer to pcp         Nutritional Assessment: The patient's BMI is: Body mass index is 25.99 kg/m.Marland Kitchen Seen by dietician.  I agree with the assessment and plan as outlined below: Nutrition Status: Nutrition Problem: Inadequate oral intake Etiology: acute illness, poor appetite Signs/Symptoms: per patient/family report Interventions: Ensure Enlive (each supplement provides 350kcal and 20 grams     Discharge Exam: BP 127/62 (BP Location: Right Arm)   Pulse 78   Temp 97.9 F (36.6 C)   Resp 20   Ht 5' 6"  (1.676 m)   Wt 73 kg   SpO2 99%   BMI 25.99 kg/m   General: NAD, pleasant ,  AAOx3, does has memory impairment Cardiovascular: RRR Respiratory: normal respiratory effort     Discharge Instructions     Diet - low sodium heart healthy   Complete by: As directed    Increase activity slowly   Complete by: As directed       Allergies as of 12/20/2021   No Known Allergies      Medication List     STOP taking these medications    acetaminophen 325 MG tablet Commonly known as: TYLENOL   bisacodyl 10 MG suppository Commonly known as: DULCOLAX   blood glucose meter kit and supplies Kit   [name]'s butt cream Crea   naproxen 375 MG tablet Commonly known as: NAPROSYN   senna 8.6 MG Tabs tablet Commonly known as: SENOKOT       TAKE these medications    albuterol (2.5 MG/3ML) 0.083% nebulizer solution Commonly known as: PROVENTIL Take 3 mLs (2.5 mg total) by nebulization every 6 (six) hours as needed for wheezing or shortness of breath.   albuterol 108 (90 Base) MCG/ACT inhaler Commonly known as: VENTOLIN HFA Inhale 2 puffs into the lungs every 6 (six) hours as needed for wheezing or shortness of breath.    allopurinol 300 MG tablet Commonly known as: ZYLOPRIM Take 1 tablet (300 mg total) by mouth 2 (two) times daily. Please follow up with your pcp regarding uric acid level and allopurinol dose adjustment What changed: additional instructions   amLODipine 5 MG tablet Commonly known as: NORVASC Take 1 tablet (5 mg total) by mouth daily.   cefadroxil 500 MG capsule Commonly known as: DURICEF Take 1 capsule (500 mg total) by mouth 2 (two) times daily for 3 days.   diazepam 5 MG tablet Commonly known as: VALIUM Take 1 tablet (5 mg total) by mouth every 8 (eight) hours as needed for anxiety.   Diclofenac Sodium CR 100 MG 24 hr tablet Take 1 tablet (100 mg total) by mouth at bedtime.   HYDROcodone-acetaminophen 7.5-325 MG tablet Commonly known as: NORCO Take 1 tablet by mouth daily as needed for moderate pain.   lisinopril 30 MG tablet Commonly known as: ZESTRIL Take 30 mg by mouth daily. What changed: Another medication with the  same name was removed. Continue taking this medication, and follow the directions you see here.   metoprolol succinate 100 MG 24 hr tablet Commonly known as: TOPROL-XL Take 1 tablet (100 mg total) by mouth daily.   omeprazole 20 MG capsule Commonly known as: PRILOSEC Take 1 capsule (20 mg total) by mouth 2 (two) times daily.   polyethylene glycol 17 g packet Commonly known as: MIRALAX / GLYCOLAX Take 17 g by mouth 2 (two) times daily.   rosuvastatin 5 MG tablet Commonly known as: CRESTOR Take 1 tablet (5 mg total) by mouth every evening.   sertraline 50 MG tablet Commonly known as: ZOLOFT Take 1 tablet (50 mg total) by mouth daily.   tamsulosin 0.4 MG Caps capsule Commonly known as: FLOMAX Take 1 capsule (0.4 mg total) by mouth daily.   vitamin B-12 500 MCG tablet Commonly known as: CYANOCOBALAMIN Take 1 tablet (500 mcg total) by mouth daily.   Vitamin D (Ergocalciferol) 1.25 MG (50000 UNIT) Caps capsule Commonly known as: DRISDOL Take 1  capsule (50,000 Units total) by mouth once a week. Please have your vitamin D level checked by your pcp What changed: additional instructions               Durable Medical Equipment  (From admission, onward)           Start     Ordered   12/19/21 1618  For home use only DME Nebulizer machine  Once       Question Answer Comment  Patient needs a nebulizer to treat with the following condition Wheezing   Length of Need Lifetime      12/19/21 1618           No Known Allergies  Follow-up Information     Henson, Vickie L, NP-C Follow up in 1 week(s).   Specialty: Family Medicine Why: Hospital discharge follow-up, repeat CBC and BMP at follow-up please check blood pressure twice a day ,bring in record for your pcp to review , further blood pressure medication adjustment per pcp Contact information: [place] [address] [phone]         [facility] Follow up.   Why: For frequent UTI History of prostate cancer Contact information: [place]        Edrick Kins, DPM Follow up.   Specialty: Podiatry Why: as needed Contact information: [address] [place] [address] [phone]                  The results of significant diagnostics from this hospitalization (including imaging, microbiology, ancillary and laboratory) are listed below for reference.    Significant Diagnostic Studies: DG Chest 2 View  Result Date: 12/19/2021 CLINICAL DATA:  Cough EXAM: CHEST - 2 VIEW COMPARISON:  12/16/2021 FINDINGS: Stable cardiomediastinal silhouette. Possible small left effusion. Streaky left basilar opacity. No pneumothorax IMPRESSION: Possible small left effusion with streaky left lung base opacity, favor atelectasis over pneumonia Electronically Signed   By: Donavan Foil M.D.   On: 12/19/2021 18:27   DG Chest 2 View  Result Date:  12/16/2021 CLINICAL DATA:  Cough.  Altered mental status. EXAM: CHEST - 2 VIEW COMPARISON:  06/22/2020 FINDINGS: Atherosclerotic calcification of the aortic arch. Heart size within normal limits for projection. Hazy lower lobe density on the lateral projection not well corroborated on the frontal view. The lungs appear otherwise clear. No blunting of  the costophrenic angles. Suspected shoulder arthropathy likely with chronic rotator cuff tears. IMPRESSION: 1. Hazy density projecting over the posterior basal segments of the lower lobes on the lateral projection but not well corroborated on the frontal view. Early pneumonia or bronchopneumonia is not excluded. 2.  Aortic Atherosclerosis (ICD10-I70.0). Electronically Signed   By: Van Clines M.D.   On: 12/16/2021 11:52   DG Foot 2 Views Left  Result Date: 12/19/2021 CLINICAL DATA:  Left foot pain at heel on posterior aspect. EXAM: LEFT FOOT - 2 VIEW COMPARISON:  None Available. FINDINGS: Diffuse demineralization of bone. Thin sclerotic band in the calcaneus seen on lateral view may reflect a compression fracture. Degenerative change of the midfoot and ankle joint. Achilles tendon mineralization. Diffuse soft tissue swelling. IMPRESSION: 1. Demineralized bone with a thin sclerotic band in the calcaneus seen on lateral view possibly reflecting a compression type fracture suggest correlation with direct tenderness to palpation. 2. Degenerative change of the midfoot and ankle joint. 3. Diffuse soft tissue swelling. Electronically Signed   By: Dahlia Bailiff M.D.   On: 12/19/2021 11:16   VAS [place] LOWER EXTREMITY VENOUS (DVT)  Result Date: 12/19/2021  Lower Venous DVT Study Patient Name:  DIXON LUCZAK  Date of Exam:   12/19/2021 Medical Rec #: [id number]      Accession #:    [id number] Date of Birth: 07-24-1946      Patient Gender: M Patient Age:   75 years Exam [facility]  [facility] Procedure:      VAS [place] LOWER EXTREMITY VENOUS (DVT) Referring Phys:  Annamaria Boots Kineta Fudala --------------------------------------------------------------------------------  Indications: Edema.  Limitations: Body habitus and poor ultrasound/tissue interface. Comparison Study: Previous exam 06/23/20 was negative for DVT Performing Technologist: Rogelia Rohrer RVT, RDMS  Examination Guidelines: A complete evaluation includes B-mode imaging, spectral Doppler, color Doppler, and power Doppler as needed of all accessible portions of each vessel. Bilateral testing is considered an integral part of a complete examination. Limited examinations for reoccurring indications may be performed as noted. The reflux portion of the exam is performed with the patient in reverse Trendelenburg.  +-----+---------------+---------+-----------+----------+--------------+ RIGHTCompressibilityPhasicitySpontaneityPropertiesThrombus Aging +-----+---------------+---------+-----------+----------+--------------+ CFV  Full           Yes      Yes                                 +-----+---------------+---------+-----------+----------+--------------+   +---------+---------------+---------+-----------+----------+--------------+ LEFT     CompressibilityPhasicitySpontaneityPropertiesThrombus Aging +---------+---------------+---------+-----------+----------+--------------+ CFV      Full           Yes      Yes                                 +---------+---------------+---------+-----------+----------+--------------+ SFJ      Full                                                        +---------+---------------+---------+-----------+----------+--------------+ FV Prox  Full           Yes      Yes                                 +---------+---------------+---------+-----------+----------+--------------+  FV Mid   Full           Yes      Yes                                 +---------+---------------+---------+-----------+----------+--------------+ FV DistalFull           Yes      Yes                                  +---------+---------------+---------+-----------+----------+--------------+ PFV      Full                                                        +---------+---------------+---------+-----------+----------+--------------+ POP      Full           Yes      Yes                                 +---------+---------------+---------+-----------+----------+--------------+ PTV      Full                                                        +---------+---------------+---------+-----------+----------+--------------+ PERO                                                  Not visualized +---------+---------------+---------+-----------+----------+--------------+   Left Technical Findings: Not visualized segments include peroneal veins.   Summary: RIGHT: - No evidence of common femoral vein obstruction.  LEFT: - There is no evidence of deep vein thrombosis in the lower extremity.  - No cystic structure found in the popliteal fossa.  *See table(s) above for measurements and observations. Electronically signed by Jamelle Haring on 12/19/2021 at 4:46:47 PM.    Final     Microbiology: Recent Results (from the past 240 hour(s))  Resp Panel by RT-PCR (Flu A&B, Covid) Nasopharyngeal Swab     Status: None   Collection Time: 12/16/21 12:35 PM   Specimen: Nasopharyngeal Swab; Nasopharyngeal(NP) swabs in vial transport medium  Result Value Ref Range Status   SARS Coronavirus 2 by RT PCR NEGATIVE NEGATIVE Final    Comment: (NOTE) SARS-CoV-2 target nucleic acids are NOT DETECTED.  The SARS-CoV-2 RNA is generally detectable in upper respiratory specimens during the acute phase of infection. The lowest concentration of SARS-CoV-2 viral copies this assay can detect is 138 copies/mL. A negative result does not preclude SARS-Cov-2 infection and should not be used as the sole basis for treatment or other patient management decisions. A negative result may occur with  improper specimen  collection/handling, submission of specimen other than nasopharyngeal swab, presence of viral mutation(s) within the areas targeted by this assay, and inadequate number of viral copies(<138 copies/mL). A negative result must be combined with clinical observations, patient history, and epidemiological information. The expected result is Negative.  Fact Sheet for Patients:  EntrepreneurPulse.com.au  Fact Sheet for Healthcare Providers:  IncredibleEmployment.be  This test is no t yet approved or cleared by the [place] FDA and  has been authorized for detection and/or diagnosis of SARS-CoV-2 by FDA under an Emergency Use Authorization (EUA). This EUA will remain  in effect (meaning this test can be used) for the duration of the COVID-19 declaration under Section 564(b)(1) of the Act, 21 U.S.C.section 360bbb-3(b)(1), unless the authorization is terminated  or revoked sooner.       Influenza A by PCR NEGATIVE NEGATIVE Final   Influenza B by PCR NEGATIVE NEGATIVE Final    Comment: (NOTE) The Xpert Xpress SARS-CoV-2/FLU/RSV plus assay is intended as an aid in the diagnosis of influenza from Nasopharyngeal swab specimens and should not be used as a sole basis for treatment. Nasal washings and aspirates are unacceptable for Xpert Xpress SARS-CoV-2/FLU/RSV testing.  Fact Sheet for Patients: EntrepreneurPulse.com.au  Fact Sheet for Healthcare Providers: IncredibleEmployment.be  This test is not yet approved or cleared by the [place] FDA and has been authorized for detection and/or diagnosis of SARS-CoV-2 by FDA under an Emergency Use Authorization (EUA). This EUA will remain in effect (meaning this test can be used) for the duration of the COVID-19 declaration under Section 564(b)(1) of the Act, 21 U.S.C. section 360bbb-3(b)(1), unless the authorization is terminated or revoked.  Performed at [facility], [place] [address] [place], [place] [address]   Urine Culture     Status: Abnormal   Collection Time: 12/16/21  3:53 PM   Specimen: Urine, Clean Catch  Result Value Ref Range Status   Specimen Description   Final    URINE, CLEAN CATCH Performed at [facility], [place] [address] [place], [place] [address]    Special Requests   Final    NONE Performed at [facility], [place] [address] [place], [place] [address]    Culture >=100,000 COLONIES/mL ESCHERICHIA COLI (A)  Final   Report Status 12/19/2021 FINAL  Final   Organism ID, Bacteria ESCHERICHIA COLI (A)  Final      Susceptibility   Escherichia coli - MIC*    AMPICILLIN <=2 SENSITIVE Sensitive     CEFAZOLIN <=4 SENSITIVE Sensitive     CEFEPIME <=0.12 SENSITIVE Sensitive     CEFTRIAXONE <=0.25 SENSITIVE Sensitive     CIPROFLOXACIN <=0.25 SENSITIVE Sensitive     GENTAMICIN <=1 SENSITIVE Sensitive     IMIPENEM <=0.25 SENSITIVE Sensitive     NITROFURANTOIN <=16 SENSITIVE Sensitive     TRIMETH/SULFA <=20 SENSITIVE Sensitive     AMPICILLIN/SULBACTAM <=2 SENSITIVE Sensitive     PIP/TAZO <=4 SENSITIVE Sensitive     * >=100,000 COLONIES/mL ESCHERICHIA COLI  Culture, blood (Routine X 2) w Reflex to ID Panel     Status: None (Preliminary result)   Collection Time: 12/16/21  7:10 PM   Specimen: BLOOD  Result Value Ref Range Status   Specimen Description   Final    BLOOD RIGHT ANTECUBITAL Performed at [place] [address] [place], [place] [address]    Special Requests   Final    IN PEDIATRIC BOTTLE Blood Culture adequate volume Performed at [place] [address], [place], [place] [address]    Culture   Final    NO GROWTH 4 DAYS Performed at [facility], [place] [address], [place], [place] [address]    Report Status PENDING  Incomplete  Culture,  blood (Routine X 2) w Reflex to ID Panel     Status: None (Preliminary  result)   Collection Time: 12/16/21  7:12 PM   Specimen: BLOOD  Result Value Ref Range Status   Specimen Description   Final    BLOOD BLOOD RIGHT WRIST Performed at [place] [address] [place], [place] [address]    Special Requests   Final    IN PEDIATRIC BOTTLE Blood Culture adequate volume Performed at [facility] [address], [place], [place] [address]    Culture   Final    NO GROWTH 4 DAYS Performed at [facility], [place] [address], [place],  [address]    Report Status PENDING  Incomplete     Labs: Basic Metabolic Panel: Recent Labs  Lab 12/16/21 1138 12/17/21 0502 12/19/21 0519  NA 141 145 139  K 4.1 3.4* 4.0  CL 112* 117* 111  CO2 21* 20* 24  GLUCOSE 153* 95 104*  BUN 30* 19 19  CREATININE 1.49* 0.97 0.99  CALCIUM 8.8* 8.5* 8.6*  MG  --   --  1.8   Liver Function Tests: Recent Labs  Lab 12/16/21 1138  AST 13*  ALT 32  ALKPHOS 94  BILITOT 1.1  PROT 6.9  ALBUMIN 3.3*   No results for input(s): LIPASE, AMYLASE in the last 168 hours. No results for input(s): AMMONIA in the last 168 hours. CBC: Recent Labs  Lab 12/16/21 1138 12/17/21 0502  WBC 13.5* 10.3  HGB 11.2* 10.5*  HCT 33.6* 32.5*  MCV 97.1 97.3  PLT 236 169   Cardiac Enzymes: No results for input(s): CKTOTAL, CKMB, CKMBINDEX, TROPONINI in the last 168 hours. BNP: BNP (last 3 results) No results for input(s): BNP in the last 8760 hours.  ProBNP (last 3 results) No results for input(s): PROBNP in the last 8760 hours.  CBG: Recent Labs  Lab 12/18/21 2240 12/19/21 1208 12/19/21 1636 12/19/21 2142 12/20/21 0749  GLUCAP 152* 122* 132* 155* 88    FURTHER DISCHARGE INSTRUCTIONS:   Get Medicines reviewed and adjusted: Please take all your medications with you for your next visit with your Primary MD   Laboratory/radiological data: Please request your Primary MD to go over all hospital tests and procedure/radiological  results at the follow up, please ask your Primary MD to get all Hospital records sent to his/her office.   In some cases, they will be blood work, cultures and biopsy results pending at the time of your discharge. Please request that your primary care M.D. goes through all the records of your hospital data and follows up on these results.   Also Note the following: If you experience worsening of your admission symptoms, develop shortness of breath, life threatening emergency, suicidal or homicidal thoughts you must seek medical attention immediately by calling 911 or calling your MD immediately  if symptoms less severe.   You must read complete instructions/literature along with all the possible adverse reactions/side effects for all the Medicines you take and that have been prescribed to you. Take any new Medicines after you have completely understood and accpet all the possible adverse reactions/side effects.    Do not drive when taking Pain medications or sleeping medications (Benzodaizepines)   Do not take more than prescribed Pain, Sleep and Anxiety Medications. It is not advisable to combine anxiety,sleep and pain medications without talking with your primary care practitioner   Special Instructions: If you have smoked or chewed Tobacco  in the last 2 yrs please stop smoking, stop any regular Alcohol  and or any Recreational drug use.   Wear Seat belts while driving.   Please note: You were cared for by a hospitalist during your hospital stay. Once you are discharged, your primary care physician will handle any further medical issues. Please note that NO REFILLS for any discharge medications will be authorized once you are discharged, as it is imperative that you return to your primary care physician (or establish a relationship with a primary care physician if you do not have one) for your post hospital discharge needs so that they can reassess your need for medications and monitor your lab  values.     Signed:  Florencia Reasons MD, PhD, FACP  [facility] 12/20/2021, 10:51 AM

## 2021-12-21 LAB — CULTURE, BLOOD (ROUTINE X 2)
Culture: NO GROWTH
Culture: NO GROWTH
Special Requests: ADEQUATE
Special Requests: ADEQUATE

## 2021-12-24 ENCOUNTER — Telehealth: Payer: Self-pay

## 2021-12-24 NOTE — Telephone Encounter (Signed)
Transition Care Management Unsuccessful Follow-up Telephone Call  Date of discharge and from where:  [place] 12-20-21 Dx: severe sepsis  Attempts:  1st Attempt  Reason for unsuccessful TCM follow-up call:  Left voice message  Transition Care Management Unsuccessful Follow-up Telephone Call  Date of discharge and from where:  [place] 12-20-21 Dx: severe sepsis  Attempts:  2nd Attempt  Reason for unsuccessful TCM follow-up call:  Left voice message  Transition Care Management Unsuccessful Follow-up Telephone Call  Date of discharge and from where:  [place] 12-20-21 Dx: severe sepsis  Attempts:  3rd Attempt  Reason for unsuccessful TCM follow-up call:  Left voice message

## 2022-01-24 ENCOUNTER — Encounter: Payer: Self-pay | Admitting: Family Medicine

## 2022-01-24 ENCOUNTER — Ambulatory Visit (INDEPENDENT_AMBULATORY_CARE_PROVIDER_SITE_OTHER): Payer: Medicare Other | Admitting: Family Medicine

## 2022-01-24 VITALS — BP 130/60 | HR 70 | Temp 97.6°F | Ht 66.0 in

## 2022-01-24 DIAGNOSIS — I10 Essential (primary) hypertension: Secondary | ICD-10-CM | POA: Diagnosis not present

## 2022-01-24 DIAGNOSIS — Z8619 Personal history of other infectious and parasitic diseases: Secondary | ICD-10-CM

## 2022-01-24 DIAGNOSIS — G8929 Other chronic pain: Secondary | ICD-10-CM

## 2022-01-24 DIAGNOSIS — N390 Urinary tract infection, site not specified: Secondary | ICD-10-CM | POA: Diagnosis not present

## 2022-01-24 DIAGNOSIS — E559 Vitamin D deficiency, unspecified: Secondary | ICD-10-CM

## 2022-01-24 DIAGNOSIS — E785 Hyperlipidemia, unspecified: Secondary | ICD-10-CM

## 2022-01-24 DIAGNOSIS — D509 Iron deficiency anemia, unspecified: Secondary | ICD-10-CM | POA: Diagnosis not present

## 2022-01-24 DIAGNOSIS — Z8546 Personal history of malignant neoplasm of prostate: Secondary | ICD-10-CM

## 2022-01-24 DIAGNOSIS — M25569 Pain in unspecified knee: Secondary | ICD-10-CM

## 2022-01-24 DIAGNOSIS — F419 Anxiety disorder, unspecified: Secondary | ICD-10-CM | POA: Diagnosis not present

## 2022-01-24 LAB — CBC WITH DIFFERENTIAL/PLATELET
Basophils Absolute: 0.1 10*3/uL (ref 0.0–0.1)
Basophils Relative: 1 % (ref 0.0–3.0)
Eosinophils Absolute: 0.7 10*3/uL (ref 0.0–0.7)
Eosinophils Relative: 7.5 % — ABNORMAL HIGH (ref 0.0–5.0)
HCT: 36.4 % — ABNORMAL LOW (ref 39.0–52.0)
Hemoglobin: 11.8 g/dL — ABNORMAL LOW (ref 13.0–17.0)
Lymphocytes Relative: 21.1 % (ref 12.0–46.0)
Lymphs Abs: 1.9 10*3/uL (ref 0.7–4.0)
MCHC: 32.5 g/dL (ref 30.0–36.0)
MCV: 97.7 fl (ref 78.0–100.0)
Monocytes Absolute: 0.5 10*3/uL (ref 0.1–1.0)
Monocytes Relative: 5 % (ref 3.0–12.0)
Neutro Abs: 6 10*3/uL (ref 1.4–7.7)
Neutrophils Relative %: 65.4 % (ref 43.0–77.0)
Platelets: 170 10*3/uL (ref 150.0–400.0)
RBC: 3.73 Mil/uL — ABNORMAL LOW (ref 4.22–5.81)
RDW: 16.7 % — ABNORMAL HIGH (ref 11.5–15.5)
WBC: 9.1 10*3/uL (ref 4.0–10.5)

## 2022-01-24 LAB — BASIC METABOLIC PANEL
BUN: 27 mg/dL — ABNORMAL HIGH (ref 6–23)
CO2: 26 mEq/L (ref 19–32)
Calcium: 9.3 mg/dL (ref 8.4–10.5)
Chloride: 109 mEq/L (ref 96–112)
Creatinine, Ser: 1.3 mg/dL (ref 0.40–1.50)
GFR: 52.76 mL/min — ABNORMAL LOW (ref >60.00)
Glucose, Bld: 110 mg/dL — ABNORMAL HIGH (ref 70–99)
Potassium: 4.2 mEq/L (ref 3.5–5.1)
Sodium: 141 mEq/L (ref 135–145)

## 2022-01-24 LAB — VITAMIN D 25 HYDROXY (VIT D DEFICIENCY, FRACTURES): VITD: 58.12 ng/mL (ref 30.00–100.00)

## 2022-01-24 NOTE — Assessment & Plan Note (Signed)
Discharged from hospital on 12/20/2021. Reviewed discharge summary and results. Check labs and follow up

## 2022-01-24 NOTE — Assessment & Plan Note (Signed)
Referral to urologist

## 2022-01-24 NOTE — Assessment & Plan Note (Signed)
Continue SSRI and daily diazepam.

## 2022-01-24 NOTE — Assessment & Plan Note (Signed)
And recent sepsis d/t UTI. Referral to urology.

## 2022-01-24 NOTE — Assessment & Plan Note (Signed)
Check vitamin D level and adjust does as needed.

## 2022-01-24 NOTE — Assessment & Plan Note (Signed)
Continue medications. Check labs and follow up.

## 2022-01-24 NOTE — Patient Instructions (Signed)
Go downstairs for labs today before you leave.   Please call us today or Monday morning to let us know which medications need refilled.   We will be in touch with results and when to follow up.

## 2022-01-24 NOTE — Progress Notes (Signed)
New Patient Office Visit  Subjective    Patient ID: Eduardo Moon, male    DOB: Nov 06, 1945  Age: 76 y.o. MRN: [id number]  CC:  Chief Complaint  Patient presents with   Establish Care    Was just in hospital for UTI, states urine is starting to smell again and would like options on how to avoid them.   Also would like to discuss his poor appetite     HPI Eduardo Moon presents to establish care. His daughter is with him today. He is in a wheelchair.  Goes by "Eduardo Moon"  Diagnosed with polio at age 89. In a wheelchair for the past 2 years.  Wife passed away in 11/07/2004. He lives with his oldest daughter.   Dr. Rex Kras was his previous PCP. [facility].    Hospitalized May 23 -26th 2023 for UTI that led to sepsis.  Hx of prostate cancer in 08-Nov-2010.  [facility]. Last there earlier this year.    HTN- Amlodipine 5 mg daily, lisinopril 30 mg daily, metoprolol XL 100 mg Has not been checking BP   Gout and arthritis. Takes 1 Norco daily for pain.   Anxiety- Diazapam nightly for sleep  No gout attack in 2 years.   Denies fever, chills, dizziness, chest pain, palpitations, shortness of breath, abdominal pain, N/V/D, urinary symptoms, LE edema.      Outpatient Encounter Medications as of 01/24/2022  Medication Sig   albuterol (PROVENTIL) (2.5 MG/3ML) 0.083% nebulizer solution Take 3 mLs (2.5 mg total) by nebulization every 6 (six) hours as needed for wheezing or shortness of breath.   albuterol (VENTOLIN HFA) 108 (90 Base) MCG/ACT inhaler Inhale 2 puffs into the lungs every 6 (six) hours as needed for wheezing or shortness of breath.   allopurinol (ZYLOPRIM) 300 MG tablet Take 1 tablet (300 mg total) by mouth 2 (two) times daily. Please follow up with your pcp regarding uric acid level and allopurinol dose adjustment   amLODipine (NORVASC) 5 MG tablet Take 1 tablet (5 mg total) by mouth daily.   diazepam (VALIUM) 5 MG tablet Take 1 tablet (5 mg total) by mouth  every 8 (eight) hours as needed for anxiety.   Diclofenac Sodium CR 100 MG 24 hr tablet Take 1 tablet (100 mg total) by mouth at bedtime.   HYDROcodone-acetaminophen (NORCO) 7.5-325 MG tablet Take 1 tablet by mouth daily as needed for moderate pain.   lisinopril (ZESTRIL) 30 MG tablet Take 30 mg by mouth daily.   metoprolol succinate (TOPROL-XL) 100 MG 24 hr tablet Take 1 tablet (100 mg total) by mouth daily.   omeprazole (PRILOSEC) 20 MG capsule Take 1 capsule (20 mg total) by mouth 2 (two) times daily.   polyethylene glycol (MIRALAX / GLYCOLAX) 17 g packet Take 17 g by mouth 2 (two) times daily.   rosuvastatin (CRESTOR) 5 MG tablet Take 1 tablet (5 mg total) by mouth every evening.   sertraline (ZOLOFT) 50 MG tablet Take 1 tablet (50 mg total) by mouth daily.   tamsulosin (FLOMAX) 0.4 MG CAPS capsule Take 1 capsule (0.4 mg total) by mouth daily.   Vitamin D, Ergocalciferol, (DRISDOL) 1.25 MG (50000 UNIT) CAPS capsule Take 1 capsule (50,000 Units total) by mouth once a week. Please have your vitamin D level checked by your pcp   [DISCONTINUED] vitamin B-12 (CYANOCOBALAMIN) 500 MCG tablet Take 1 tablet (500 mcg total) by mouth daily. (Patient not taking: Reported on 12/16/2021)   No facility-administered encounter medications on  file as of 01/24/2022.    Past Medical History:  Diagnosis Date   Arthritis    History of prostate cancer s/p radiactive seed implants  03-24-2011   Hypertension    Mild acid reflux watches diet   Polio age 9   Polio    Scrotal abscess    Weakness 05/2020   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   uses w/c for distance    Past Surgical History:  Procedure Laterality Date   [place]   BILATERAL   ORCHIECTOMY  05/07/2012   Procedure: ORCHIECTOMY;  Surgeon: Bernestine Amass, MD;  Location: [facility];  Service: Urology;  Laterality: Right;   RADIOACTIVE PROSTATE SEED IMPLANTS  04-21-2011    DR Risa Grill   PROSTATE CANCER   SCROTAL  EXPLORATION  05/07/2012   Procedure: SCROTUM EXPLORATION;  Surgeon: Bernestine Amass, MD;  Location: [facility];  Service: Urology;  Laterality: Right;  DRAINAGE OF ABSCESS  1 HR  UHC MEDICARE    Family History  Problem Relation Age of Onset   Cancer Mother    Cancer Father    Cancer Brother     Social History   Socioeconomic History   Marital status: Widowed    Spouse name: Not on file   Number of children: Not on file   Years of education: Not on file   Highest education level: Not on file  Occupational History   Not on file  Tobacco Use   Smoking status: Never   Smokeless tobacco: Never  Vaping Use   Vaping Use: Never used  Substance and Sexual Activity   Alcohol use: No   Drug use: No   Sexual activity: Not Currently  Other Topics Concern   Not on file  Social History Narrative   Not on file   Social Determinants of Health   Financial Resource Strain: Not on file  Food Insecurity: Not on file  Transportation Needs: Not on file  Physical Activity: Not on file  Stress: Not on file  Social Connections: Not on file  Intimate Partner Violence: Not on file    ROS      Objective    BP 130/60 (BP Location: Left Arm, Patient Position: Sitting, Cuff Size: Large)   Pulse 70   Temp 97.6 F (36.4 C) (Temporal)   Ht '5\' 6"'$  (1.676 m)   SpO2 97%   BMI 25.99 kg/m   Physical Exam Constitutional:      Comments: Sitting in wheelchair  Eyes:     Conjunctiva/sclera: Conjunctivae normal.     Pupils: Pupils are equal, round, and reactive to light.  Cardiovascular:     Rate and Rhythm: Normal rate.  Pulmonary:     Effort: Pulmonary effort is normal.     Breath sounds: Normal breath sounds.  Skin:    General: Skin is warm and dry.  Neurological:     Mental Status: He is alert. Mental status is at baseline.  Psychiatric:        Behavior: Behavior normal.         Assessment & Plan:   Problem List Items Addressed This Visit        Cardiovascular and Mediastinum   Essential hypertension    Continue medications. Check labs and follow up.       Relevant Orders   CBC with Differential/Platelet (Completed)   Basic metabolic panel (Completed)     Genitourinary   Recurrent UTI    And  recent sepsis d/t UTI. Referral to urology.       Relevant Orders   Ambulatory referral to Urology     Other   Anxiety    Continue SSRI and daily diazepam.       History of prostate cancer    Referral to urologist       Relevant Orders   Ambulatory referral to Urology   HLD (hyperlipidemia)   Hx of sepsis - Primary    Discharged from hospital on 12/20/2021. Reviewed discharge summary and results. Check labs and follow up      Relevant Orders   Ambulatory referral to Urology   Iron deficiency anemia   Relevant Orders   CBC with Differential/Platelet (Completed)   Knee pain    Continue daily hydrocodone for now.      Vitamin D deficiency    Check vitamin D level and adjust does as needed.       Relevant Orders   VITAMIN D 25 Hydroxy (Vit-D Deficiency, Fractures) (Completed)    Return for pending labs.   Harland Dingwall, NP-C

## 2022-01-24 NOTE — Assessment & Plan Note (Signed)
Continue daily hydrocodone for now.

## 2022-01-29 NOTE — Progress Notes (Signed)
Labs are stable. Let's see him back in the office in 3 months or sooner if needed.

## 2022-02-24 ENCOUNTER — Telehealth: Payer: Self-pay

## 2022-02-24 NOTE — Telephone Encounter (Signed)
Pt daughter is calling requesting refills on: allopurinol (ZYLOPRIM) 300 MG tablet rosuvastatin (CRESTOR) 5 MG tablet diazepam (VALIUM) 5 MG tablet Diclofenac Sodium CR 100 MG 24 hr tablet HYDROcodone-acetaminophen (NORCO) 7.5-325 MG tablet sertraline (ZOLOFT) 50 MG tablet  Pharmacy: [id number]- [place], [place] - [address] LOV 01/24/22 ROV 05/01/22

## 2022-02-24 NOTE — Telephone Encounter (Signed)
LOV: 01/24/22 New Patient visit, you requested he let us know what refills he needed.  This will be his 1st fill with [place], ok to refill? If so, can you please send in the controlled medications and I can take care of the rest.

## 2022-02-26 ENCOUNTER — Other Ambulatory Visit: Payer: Self-pay | Admitting: Family Medicine

## 2022-02-26 DIAGNOSIS — E785 Hyperlipidemia, unspecified: Secondary | ICD-10-CM

## 2022-02-26 DIAGNOSIS — M1990 Unspecified osteoarthritis, unspecified site: Secondary | ICD-10-CM

## 2022-02-26 DIAGNOSIS — M109 Gout, unspecified: Secondary | ICD-10-CM

## 2022-02-26 DIAGNOSIS — F419 Anxiety disorder, unspecified: Secondary | ICD-10-CM

## 2022-02-26 MED ORDER — HYDROCODONE-ACETAMINOPHEN 7.5-325 MG PO TABS: 1.0000 | ORAL_TABLET | Freq: Every day | ORAL | 0 refills | Status: DC | PRN

## 2022-02-26 MED ORDER — SERTRALINE HCL 50 MG PO TABS: 50.0000 mg | ORAL_TABLET | Freq: Every day | ORAL | 2 refills | Status: DC

## 2022-02-26 MED ORDER — ROSUVASTATIN CALCIUM 5 MG PO TABS: 5.0000 mg | ORAL_TABLET | Freq: Every evening | ORAL | 0 refills | Status: DC

## 2022-02-26 MED ORDER — DIAZEPAM 5 MG PO TABS: 5.0000 mg | ORAL_TABLET | Freq: Every day | ORAL | 0 refills | Status: DC | PRN

## 2022-02-26 MED ORDER — ALLOPURINOL 300 MG PO TABS: 300.0000 mg | ORAL_TABLET | Freq: Every day | ORAL | 2 refills | Status: DC

## 2022-02-26 MED ORDER — DICLOFENAC SODIUM ER 100 MG PO TB24: 100.0000 mg | ORAL_TABLET | Freq: Every day | ORAL | 1 refills | Status: DC

## 2022-02-26 NOTE — Telephone Encounter (Signed)
3 month f/u scheduled for 05/01/2022

## 2022-03-21 ENCOUNTER — Other Ambulatory Visit: Payer: Self-pay | Admitting: Family Medicine

## 2022-03-21 DIAGNOSIS — F419 Anxiety disorder, unspecified: Secondary | ICD-10-CM

## 2022-03-25 NOTE — Telephone Encounter (Signed)
Pharmacy requested a new prescription to reflect dx code and 90 day prescription in place of 30 day with refills. New Rx sent.

## 2022-04-11 ENCOUNTER — Other Ambulatory Visit: Payer: Self-pay | Admitting: Family Medicine

## 2022-04-11 DIAGNOSIS — M109 Gout, unspecified: Secondary | ICD-10-CM

## 2022-04-11 DIAGNOSIS — E785 Hyperlipidemia, unspecified: Secondary | ICD-10-CM

## 2022-04-11 DIAGNOSIS — R197 Diarrhea, unspecified: Secondary | ICD-10-CM

## 2022-04-14 NOTE — Telephone Encounter (Signed)
Please advise on refills for Naproxen and Prilosec. I do not see Naproxen anywhere on current medication list and Prilosec last filled 11/07/2020

## 2022-04-17 ENCOUNTER — Other Ambulatory Visit: Payer: Self-pay | Admitting: Family Medicine

## 2022-04-17 DIAGNOSIS — M1990 Unspecified osteoarthritis, unspecified site: Secondary | ICD-10-CM

## 2022-04-17 NOTE — Telephone Encounter (Signed)
Spoke with pt's daughter who reports he is currently taking diclofenac at night. She states she spoke with vickie at his last visit about how she is weaning him off of his hydrocodone and since she has been doing this she will usually give him the naproxen in the morning and the diclofenac at night. I relayed the risks of this as vickie stated below and pt verbalized understanding. Please advise naproxen refill

## 2022-04-17 NOTE — Telephone Encounter (Signed)
Called and spoke with [name], Del's daughter and informed her to have pt STOP the diclofenac and to only take the Naproxen twice daily. Advised that she can also give him tylenol 500 mg if needed. She verbalized understanding. Rx sent.

## 2022-04-25 ENCOUNTER — Other Ambulatory Visit: Payer: Self-pay | Admitting: Family Medicine

## 2022-04-25 DIAGNOSIS — M1990 Unspecified osteoarthritis, unspecified site: Secondary | ICD-10-CM

## 2022-04-25 DIAGNOSIS — F419 Anxiety disorder, unspecified: Secondary | ICD-10-CM

## 2022-05-01 ENCOUNTER — Ambulatory Visit: Payer: Medicare Other | Admitting: Family Medicine

## 2022-05-15 ENCOUNTER — Telehealth: Payer: Self-pay | Admitting: Family Medicine

## 2022-05-15 NOTE — Telephone Encounter (Signed)
LVM for pt to rtn my call to schedule AWV with [facility] [phone] 

## 2022-05-26 ENCOUNTER — Other Ambulatory Visit: Payer: Self-pay | Admitting: Family Medicine

## 2022-05-26 DIAGNOSIS — E785 Hyperlipidemia, unspecified: Secondary | ICD-10-CM

## 2022-06-02 ENCOUNTER — Other Ambulatory Visit: Payer: Self-pay | Admitting: Family Medicine

## 2022-06-02 ENCOUNTER — Telehealth: Payer: Self-pay | Admitting: Family Medicine

## 2022-06-02 DIAGNOSIS — M109 Gout, unspecified: Secondary | ICD-10-CM

## 2022-06-02 MED ORDER — ALLOPURINOL 300 MG PO TABS: 300.0000 mg | ORAL_TABLET | Freq: Every day | ORAL | 0 refills | Status: DC

## 2022-06-02 NOTE — Telephone Encounter (Signed)
Caller & Relationship to patient:  Call Back Number: [phone]  Date of Last Office Visit: 01/24/22  Date of Next Office Visit: 06/23/22  Medication(s) to be Refilled:  ALLOPURINOL 300 MG  Preferred Pharmacy:  ** Please notify patient to allow 48-72 hours to process** **Let patient know to contact pharmacy at the end of the day to make sure medication is ready. ** **If patient has not been seen in a year or longer, book an appointment **Advise to use MyChart for refill requests OR to contact their pharmacy

## 2022-06-02 NOTE — Telephone Encounter (Signed)
Rx sent for 30 days to get to appt

## 2022-06-17 ENCOUNTER — Other Ambulatory Visit: Payer: Self-pay | Admitting: Family Medicine

## 2022-06-17 DIAGNOSIS — M109 Gout, unspecified: Secondary | ICD-10-CM

## 2022-06-19 ENCOUNTER — Other Ambulatory Visit: Payer: Self-pay | Admitting: Family Medicine

## 2022-06-19 DIAGNOSIS — E785 Hyperlipidemia, unspecified: Secondary | ICD-10-CM

## 2022-06-23 ENCOUNTER — Ambulatory Visit: Payer: Medicare Other | Admitting: Family Medicine

## 2022-06-23 ENCOUNTER — Encounter: Payer: Self-pay | Admitting: Family Medicine

## 2022-06-23 ENCOUNTER — Ambulatory Visit (INDEPENDENT_AMBULATORY_CARE_PROVIDER_SITE_OTHER): Payer: Medicare Other | Admitting: Family Medicine

## 2022-06-23 VITALS — BP 130/70 | HR 64 | Temp 97.8°F | Ht 66.0 in

## 2022-06-23 DIAGNOSIS — E785 Hyperlipidemia, unspecified: Secondary | ICD-10-CM

## 2022-06-23 DIAGNOSIS — Z79899 Other long term (current) drug therapy: Secondary | ICD-10-CM

## 2022-06-23 DIAGNOSIS — I10 Essential (primary) hypertension: Secondary | ICD-10-CM | POA: Diagnosis not present

## 2022-06-23 DIAGNOSIS — F419 Anxiety disorder, unspecified: Secondary | ICD-10-CM | POA: Diagnosis not present

## 2022-06-23 DIAGNOSIS — R197 Diarrhea, unspecified: Secondary | ICD-10-CM

## 2022-06-23 DIAGNOSIS — Z8639 Personal history of other endocrine, nutritional and metabolic disease: Secondary | ICD-10-CM

## 2022-06-23 DIAGNOSIS — N390 Urinary tract infection, site not specified: Secondary | ICD-10-CM

## 2022-06-23 DIAGNOSIS — Z8546 Personal history of malignant neoplasm of prostate: Secondary | ICD-10-CM

## 2022-06-23 DIAGNOSIS — Z8739 Personal history of other diseases of the musculoskeletal system and connective tissue: Secondary | ICD-10-CM

## 2022-06-23 DIAGNOSIS — M109 Gout, unspecified: Secondary | ICD-10-CM

## 2022-06-23 LAB — CBC WITH DIFFERENTIAL/PLATELET
Basophils Absolute: 0.1 10*3/uL (ref 0.0–0.1)
Basophils Relative: 0.7 % (ref 0.0–3.0)
Eosinophils Absolute: 0.5 10*3/uL (ref 0.0–0.7)
Eosinophils Relative: 4.4 % (ref 0.0–5.0)
HCT: 42 % (ref 39.0–52.0)
Hemoglobin: 13.9 g/dL (ref 13.0–17.0)
Lymphocytes Relative: 19 % (ref 12.0–46.0)
Lymphs Abs: 2 10*3/uL (ref 0.7–4.0)
MCHC: 33 g/dL (ref 30.0–36.0)
MCV: 96.8 fl (ref 78.0–100.0)
Monocytes Absolute: 0.7 10*3/uL (ref 0.1–1.0)
Monocytes Relative: 6.6 % (ref 3.0–12.0)
Neutro Abs: 7.4 10*3/uL (ref 1.4–7.7)
Neutrophils Relative %: 69.3 % (ref 43.0–77.0)
Platelets: 252 10*3/uL (ref 150.0–400.0)
RBC: 4.34 Mil/uL (ref 4.22–5.81)
RDW: 14.8 % (ref 11.5–15.5)
WBC: 10.6 10*3/uL — ABNORMAL HIGH (ref 4.0–10.5)

## 2022-06-23 LAB — COMPREHENSIVE METABOLIC PANEL
ALT: 14 U/L (ref 0–53)
AST: 8 U/L (ref 0–37)
Albumin: 4.1 g/dL (ref 3.5–5.2)
Alkaline Phosphatase: 94 U/L (ref 39–117)
BUN: 30 mg/dL — ABNORMAL HIGH (ref 6–23)
CO2: 23 mEq/L (ref 19–32)
Calcium: 9.1 mg/dL (ref 8.4–10.5)
Chloride: 106 mEq/L (ref 96–112)
Creatinine, Ser: 1.51 mg/dL — ABNORMAL HIGH (ref 0.40–1.50)
GFR: 43.95 mL/min — ABNORMAL LOW (ref >60.00)
Glucose, Bld: 107 mg/dL — ABNORMAL HIGH (ref 70–99)
Potassium: 4.2 mEq/L (ref 3.5–5.1)
Sodium: 138 mEq/L (ref 135–145)
Total Bilirubin: 0.4 mg/dL (ref 0.2–1.2)
Total Protein: 6.8 g/dL (ref 6.0–8.3)

## 2022-06-23 LAB — LIPID PANEL
Cholesterol: 216 mg/dL — ABNORMAL HIGH (ref 0–200)
HDL: 35 mg/dL — ABNORMAL LOW (ref >39.00)
LDL Cholesterol: 154 mg/dL — ABNORMAL HIGH (ref 0–99)
NonHDL: 180.69
Total CHOL/HDL Ratio: 6
Triglycerides: 132 mg/dL (ref 0.0–149.0)
VLDL: 26.4 mg/dL (ref 0.0–40.0)

## 2022-06-23 LAB — HEMOGLOBIN A1C: Hgb A1c MFr Bld: 5.8 % (ref 4.6–6.5)

## 2022-06-23 LAB — TSH: TSH: 5 u[IU]/mL (ref 0.35–5.50)

## 2022-06-23 LAB — URIC ACID: Uric Acid, Serum: 6.7 mg/dL (ref 4.0–7.8)

## 2022-06-23 MED ORDER — AMLODIPINE BESYLATE 5 MG PO TABS: 5.0000 mg | ORAL_TABLET | Freq: Every day | ORAL | 2 refills | Status: DC

## 2022-06-23 MED ORDER — METOPROLOL SUCCINATE ER 100 MG PO TB24: 100.0000 mg | ORAL_TABLET | Freq: Every day | ORAL | 2 refills | Status: DC

## 2022-06-23 MED ORDER — ALLOPURINOL 300 MG PO TABS: 300.0000 mg | ORAL_TABLET | Freq: Every day | ORAL | 2 refills | Status: DC

## 2022-06-23 MED ORDER — SERTRALINE HCL 50 MG PO TABS: 50.0000 mg | ORAL_TABLET | Freq: Every day | ORAL | 1 refills | Status: DC

## 2022-06-23 NOTE — Assessment & Plan Note (Signed)
Asymptomatic. Follow up with urologist.

## 2022-06-23 NOTE — Assessment & Plan Note (Signed)
Refill allopurinol pending uric acid result. Currently taking 300 mg daily.

## 2022-06-23 NOTE — Assessment & Plan Note (Signed)
DM appears to be in remission. Check A1c and follow up.

## 2022-06-23 NOTE — Patient Instructions (Signed)
Please go to the lab on the first floor before you leave.   We will be in touch with your lab results.   I encourage you to call and schedule with [facility] due to your history of prostate cancer, recurrent UTIs and recent sepsis.

## 2022-06-23 NOTE — Assessment & Plan Note (Signed)
Mood is good on sertraline.

## 2022-06-23 NOTE — Assessment & Plan Note (Signed)
Encouraged him to schedule with urologist.

## 2022-06-23 NOTE — Progress Notes (Addendum)
Subjective:     Patient ID: Eduardo Moon, male    DOB: February 25, 1946, 76 y.o.   MRN: [id number]  Chief Complaint  Patient presents with   Follow-up    Check uric acid    HPI Patient is in today for follow up and to have uric acid level checked. Here with his daughter and brother. He is in a wheelchair.   HTN- Amlodipine 5 mg daily, lisinopril 30 mg daily, metoprolol XL 100 mg Has been checking BP at home occasionally. 123/82.  He has not taken his medication this morning.  He is fasting.   Taking allopurinol 300 mg daily now. He was taking it bid.   Last gout attack was 3 years ago. It was severe.   History of DM. Last A1c normal at 5.4% on 12/16/2021.   Hospitalized with sepsis d/t UTI. Hx of prostate cancer. Has not scheduled with urologist yet.  No urinary symptoms.   Diarrhea intermittent with normal stools also x several stools. 2 episodes last week. No blood.  Wears adult brief. This is not new.  Milk affects his stools. Stopped greasy, fried foods.  No laxative.   He is not taking diazepam and Norco.   [facility] - Dr. Watt Climes. Polyps.  GERD- taking omeprazole daily   Anxiety and insomnia. Sleeps during the day.   Denies fever, chills, dizziness, chest pain, palpitations, shortness of breath, abdominal pain, N/V/, urinary symptoms, LE edema.     Health Maintenance Due  Topic Date Due   Medicare Annual Wellness (AWV)  Never done   COVID-19 Vaccine (1) Never done   FOOT EXAM  Never done   OPHTHALMOLOGY EXAM  Never done   Diabetic kidney evaluation - Urine ACR  Never done   Hepatitis C Screening  Never done   Zoster Vaccines- Shingrix (1 of 2) Never done   Pneumonia Vaccine 30+ Years old (1 - PCV) Never done   INFLUENZA VACCINE  Never done    Past Medical History:  Diagnosis Date   Arthritis    History of prostate cancer s/p radiactive seed implants  03-24-2011   Hypertension    Mild acid reflux watches diet   Polio age 67   Polio    Scrotal abscess     Weakness 05/2020   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   uses w/c for distance    Past Surgical History:  Procedure Laterality Date   [place]   BILATERAL   ORCHIECTOMY  05/07/2012   Procedure: ORCHIECTOMY;  Surgeon: Bernestine Amass, MD;  Location: [facility];  Service: Urology;  Laterality: Right;   RADIOACTIVE PROSTATE SEED IMPLANTS  04-21-2011    DR Risa Grill   PROSTATE CANCER   SCROTAL EXPLORATION  05/07/2012   Procedure: SCROTUM EXPLORATION;  Surgeon: Bernestine Amass, MD;  Location: [facility];  Service: Urology;  Laterality: Right;  DRAINAGE OF ABSCESS  1 HR  UHC MEDICARE    Family History  Problem Relation Age of Onset   Cancer Mother    Cancer Father    Cancer Brother     Social History   Socioeconomic History   Marital status: Widowed    Spouse name: Not on file   Number of children: Not on file   Years of education: Not on file   Highest education level: Not on file  Occupational History   Not on file  Tobacco Use   Smoking status: Never   Smokeless tobacco: Never  Vaping Use   Vaping Use: Never used  Substance and Sexual Activity   Alcohol use: No   Drug use: No   Sexual activity: Not Currently  Other Topics Concern   Not on file  Social History Narrative   Not on file   Social Determinants of Health   Financial Resource Strain: Not on file  Food Insecurity: Not on file  Transportation Needs: Not on file  Physical Activity: Not on file  Stress: Not on file  Social Connections: Not on file  Intimate Partner Violence: Not on file    Outpatient Medications Prior to Visit  Medication Sig Dispense Refill   albuterol (PROVENTIL) (2.5 MG/3ML) 0.083% nebulizer solution Take 3 mLs (2.5 mg total) by nebulization every 6 (six) hours as needed for wheezing or shortness of breath. 75 mL 1   albuterol (VENTOLIN HFA) 108 (90 Base) MCG/ACT inhaler Inhale 2 puffs into the lungs every 6 (six) hours as needed for  wheezing or shortness of breath. 8 g 2   Diclofenac Sodium CR 100 MG 24 hr tablet TAKE 1 TABLET BY MOUTH EVERYDAY AT BEDTIME. PLEASE SCHEDULE APPOINTMENT FOR FUTURE REFILLS. 30 tablet 0   lisinopril (ZESTRIL) 30 MG tablet Take 30 mg by mouth daily.     omeprazole (PRILOSEC) 20 MG capsule TAKE 1 CAPSULE BY MOUTH TWICE A DAY 60 capsule 2   polyethylene glycol (MIRALAX / GLYCOLAX) 17 g packet Take 17 g by mouth 2 (two) times daily. 14 each 3   rosuvastatin (CRESTOR) 5 MG tablet TAKE 1 TABLET BY MOUTH EVERY DAY IN THE EVENING. Please schedule office visit for more refills. 30 tablet 0   tamsulosin (FLOMAX) 0.4 MG CAPS capsule Take 1 capsule (0.4 mg total) by mouth daily. 30 capsule 0   allopurinol (ZYLOPRIM) 300 MG tablet Take 1 tablet (300 mg total) by mouth daily. Please follow up with your pcp regarding uric acid level and allopurinol dose adjustment 30 tablet 0   amLODipine (NORVASC) 5 MG tablet Take 1 tablet (5 mg total) by mouth daily. 30 tablet 0   metoprolol succinate (TOPROL-XL) 100 MG 24 hr tablet Take 1 tablet (100 mg total) by mouth daily. 30 tablet 0   naproxen (NAPROSYN) 375 MG tablet TAKE 1 TABLET BY MOUTH TWICE A DAY AS NEEDED FOR JOINT PAIN 60 tablet 2   sertraline (ZOLOFT) 50 MG tablet TAKE 1 TABLET BY MOUTH EVERY DAY 90 tablet 0   Vitamin D, Ergocalciferol, (DRISDOL) 1.25 MG (50000 UNIT) CAPS capsule Take 1 capsule (50,000 Units total) by mouth once a week. Please have your vitamin D level checked by your pcp 4 capsule 0   diazepam (VALIUM) 5 MG tablet Take 1 tablet (5 mg total) by mouth daily as needed for anxiety. (Patient not taking: Reported on 06/23/2022) 30 tablet 0   HYDROcodone-acetaminophen (NORCO) 7.5-325 MG tablet Take 1 tablet by mouth daily as needed for moderate pain. (Patient not taking: Reported on 06/23/2022) 15 tablet 0   No facility-administered medications prior to visit.    No Known Allergies  ROS     Objective:    Physical Exam Constitutional:       General: He is not in acute distress.    Appearance: He is not ill-appearing.  HENT:     Mouth/Throat:     Mouth: Mucous membranes are moist.  Eyes:     Extraocular Movements: Extraocular movements intact.     Conjunctiva/sclera: Conjunctivae normal.  Cardiovascular:     Rate and Rhythm:  Normal rate and regular rhythm.  Pulmonary:     Effort: Pulmonary effort is normal.     Breath sounds: Normal breath sounds.  Abdominal:     General: There is no distension.     Palpations: Abdomen is soft.     Tenderness: There is no abdominal tenderness. There is no guarding or rebound.  Musculoskeletal:     Right lower leg: No edema.     Left lower leg: No edema.  Skin:    General: Skin is warm and dry.  Neurological:     General: No focal deficit present.     Mental Status: He is alert and oriented to person, place, and time.  Psychiatric:        Mood and Affect: Mood normal.        Behavior: Behavior normal.     BP 130/70 (BP Location: Left Arm, Patient Position: Sitting, Cuff Size: Large)   Pulse 64   Temp 97.8 F (36.6 C) (Temporal)   Ht '5\' 6"'$  (1.676 m)   BMI 25.99 kg/m  Wt Readings from Last 3 Encounters:  12/16/21 161 lb (73 kg)  11/07/20 153 lb 9.6 oz (69.7 kg)  11/01/20 149 lb (67.6 kg)       Assessment & Plan:   Problem List Items Addressed This Visit       Cardiovascular and Mediastinum   Essential hypertension - Primary    Amlodipine 5 mg daily, lisinopril 30 mg daily, metoprolol XL 100 mg.  BP controlled. Check BMP      Relevant Medications   metoprolol succinate (TOPROL-XL) 100 MG 24 hr tablet   amLODipine (NORVASC) 5 MG tablet   Other Relevant Orders   CBC with Differential/Platelet (Completed)   Comprehensive metabolic panel (Completed)   TSH (Completed)     Musculoskeletal and Integument   Gouty arthritis   Relevant Medications   allopurinol (ZYLOPRIM) 300 MG tablet     Genitourinary   Recurrent UTI    Asymptomatic. Follow up with urologist.          Other   Anxiety    Mood is good on sertraline.       Relevant Medications   sertraline (ZOLOFT) 50 MG tablet   Other Relevant Orders   TSH (Completed)   History of diabetes mellitus, type II    DM appears to be in remission. Check A1c and follow up.       Relevant Orders   Hemoglobin A1c (Completed)   History of gout   Relevant Orders   Uric acid (Completed)   History of prostate cancer    Encouraged him to schedule with urologist.       HLD (hyperlipidemia)    Continue statin therapy.       Relevant Medications   metoprolol succinate (TOPROL-XL) 100 MG 24 hr tablet   amLODipine (NORVASC) 5 MG tablet   Other Relevant Orders   Lipid panel (Completed)   Intermittent diarrhea    2 episodes per week. Normal stool otherwise. Is not taking anything to help bowels move. Continue to avoid triggers. Follow up if worsening. Check labs.       Relevant Orders   TSH (Completed)   Medication management    Refill allopurinol pending uric acid result. Currently taking 300 mg daily.       Relevant Orders   Uric acid (Completed)   Lipid panel (Completed)    I have discontinued Adele Schilder "Del"'s Vitamin D (Ergocalciferol), HYDROcodone-acetaminophen, diazepam, and naproxen. I  have also changed his sertraline and allopurinol. Additionally, I am having him maintain his polyethylene glycol, lisinopril, tamsulosin, albuterol, albuterol, omeprazole, Diclofenac Sodium CR, rosuvastatin, metoprolol succinate, and amLODipine.  Meds ordered this encounter  Medications   metoprolol succinate (TOPROL-XL) 100 MG 24 hr tablet    Sig: Take 1 tablet (100 mg total) by mouth daily.    Dispense:  30 tablet    Refill:  2   sertraline (ZOLOFT) 50 MG tablet    Sig: Take 1 tablet (50 mg total) by mouth daily.    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    Dispense:  30 tablet    Refill:   2   allopurinol (ZYLOPRIM) 300 MG tablet    Sig: Take 1 tablet (300 mg total) by mouth daily.    Dispense:  30 tablet    Refill:  2    Order Specific Question:   Supervising Provider    Answer:   Pricilla Holm A [[id number]]

## 2022-06-23 NOTE — Assessment & Plan Note (Signed)
Amlodipine 5 mg daily, lisinopril 30 mg daily, metoprolol XL 100 mg.  BP controlled. Check BMP

## 2022-06-23 NOTE — Assessment & Plan Note (Signed)
2 episodes per week. Normal stool otherwise. Is not taking anything to help bowels move. Continue to avoid triggers. Follow up if worsening. Check labs.

## 2022-06-23 NOTE — Assessment & Plan Note (Signed)
Continue statin therapy.

## 2022-06-23 NOTE — Progress Notes (Signed)
His LDL (the bad cholesterol) is elevated and no longer under good control. Is he taking the rosuvastatin? He should take it if not and if he is taking it, we should increase the dose. His kidney function is abnormal.  I recommend he stay well hydrated. His uric acid is in a good range. I refilled his allopurinol to take once daily. Follow up in 3 months. Let me know about the statin.

## 2022-06-24 ENCOUNTER — Encounter: Payer: Self-pay | Admitting: Family Medicine

## 2022-06-27 ENCOUNTER — Other Ambulatory Visit: Payer: Self-pay | Admitting: Family Medicine

## 2022-06-27 DIAGNOSIS — E785 Hyperlipidemia, unspecified: Secondary | ICD-10-CM

## 2022-07-01 ENCOUNTER — Ambulatory Visit (INDEPENDENT_AMBULATORY_CARE_PROVIDER_SITE_OTHER): Payer: Medicare Other | Admitting: *Deleted

## 2022-07-01 DIAGNOSIS — Z0000 Encounter for general adult medical examination without abnormal findings: Secondary | ICD-10-CM | POA: Diagnosis not present

## 2022-07-01 NOTE — Progress Notes (Signed)
Subjective:   Eduardo Moon is a 76 y.o. male who presents for an Initial Medicare Annual Wellness Visit.  I connected with  Adele Schilder on 07/01/22 by a  telephone enabled telemedicine application and verified that I am speaking with the correct person using two identifiers.   I discussed the limitations of evaluation and management by telemedicine. The patient expressed understanding and agreed to proceed.  Patient location: home  Provider location: Tele-health-home    Review of Systems     Cardiac Risk Factors include: advanced age (>91mn, >>27women);male gender;hypertension;sedentary lifestyle     Objective:    Today's Vitals   07/01/22 0959  PainSc: 3    There is no height or weight on file to calculate BMI.     07/01/2022   10:00 AM 12/20/2021   10:22 AM 12/16/2021    5:05 PM 12/16/2021   11:35 AM 10/17/2020    7:11 PM 10/17/2020    1:32 PM 06/26/2020    4:55 PM  Advanced Directives  Does Patient Have a Medical Advance Directive? Yes No No Yes Yes No No  Type of APrimary school teacherof AFreescale SemiconductorPower of AO'NeillLiving will    Does patient want to make changes to medical advance directive?     No - Patient declined    Copy of [place]? No - copy requested    No - copy requested    Would patient like information on creating a medical advance directive?  No - Patient declined No - Patient declined  No - Patient declined No - Patient declined No - Patient declined    Current Medications (verified) Outpatient Encounter Medications as of 07/01/2022  Medication Sig   albuterol (PROVENTIL) (2.5 MG/3ML) 0.083% nebulizer solution Take 3 mLs (2.5 mg total) by nebulization every 6 (six) hours as needed for wheezing or shortness of breath.   albuterol (VENTOLIN HFA) 108 (90 Base) MCG/ACT inhaler Inhale 2 puffs into the lungs every 6 (six) hours as needed for wheezing or shortness of breath.    allopurinol (ZYLOPRIM) 300 MG tablet Take 1 tablet (300 mg total) by mouth daily.   amLODipine (NORVASC) 5 MG tablet Take 1 tablet (5 mg total) by mouth daily.   Diclofenac Sodium CR 100 MG 24 hr tablet TAKE 1 TABLET BY MOUTH EVERYDAY AT BEDTIME. PLEASE SCHEDULE APPOINTMENT FOR FUTURE REFILLS.   lisinopril (ZESTRIL) 30 MG tablet Take 30 mg by mouth daily.   metoprolol succinate (TOPROL-XL) 100 MG 24 hr tablet Take 1 tablet (100 mg total) by mouth daily.   omeprazole (PRILOSEC) 20 MG capsule TAKE 1 CAPSULE BY MOUTH TWICE A DAY   polyethylene glycol (MIRALAX / GLYCOLAX) 17 g packet Take 17 g by mouth 2 (two) times daily.   rosuvastatin (CRESTOR) 5 MG tablet TAKE 1 TABLET BY MOUTH EVERY DAY IN THE EVENING. Please schedule office visit for more refills.   sertraline (ZOLOFT) 50 MG tablet Take 1 tablet (50 mg total) by mouth daily.   tamsulosin (FLOMAX) 0.4 MG CAPS capsule Take 1 capsule (0.4 mg total) by mouth daily.   No facility-administered encounter medications on file as of 07/01/2022.    Allergies (verified) Patient has no known allergies.   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   uses w/c for distance   Past Surgical History:  Procedure Laterality Date   [place]   BILATERAL   ORCHIECTOMY  05/07/2012   Procedure: ORCHIECTOMY;  Surgeon: Bernestine Amass, MD;  Location: [facility];  Service: Urology;  Laterality: Right;   RADIOACTIVE PROSTATE SEED IMPLANTS  04-21-2011    DR Risa Grill   PROSTATE CANCER   SCROTAL EXPLORATION  05/07/2012   Procedure: SCROTUM EXPLORATION;  Surgeon: Bernestine Amass, MD;  Location: [facility];  Service: Urology;  Laterality: Right;  DRAINAGE OF ABSCESS  1 HR  U[employer]  MEDICARE   Family History  Problem Relation Age of Onset   Cancer Mother    Cancer Father    Cancer Brother    Social History   Socioeconomic History   Marital status: Widowed    Spouse name: Not on file   Number of children: Not on file   Years of education: Not on file   Highest education level: Not on file  Occupational History   Not on file  Tobacco Use   Smoking status: Never   Smokeless tobacco: Never  Vaping Use   Vaping Use: Never used  Substance and Sexual Activity   Alcohol use: No   Drug use: No   Sexual activity: Not Currently  Other Topics Concern   Not on file  Social History Narrative   Not on file   Social Determinants of Health   Financial Resource Strain: Low Risk  (07/01/2022)   Overall Financial Resource Strain (CARDIA)    Difficulty of Paying Living Expenses: Not hard at all  Food Insecurity: No Food Insecurity (07/01/2022)   Hunger Vital Sign    Worried About Running Out of Food in the Last Year: Never true    [place] in the Last Year: Never true  Transportation Needs: No Transportation Needs (07/01/2022)   PRAPARE - Hydrologist (Medical): No    Lack of Transportation (Non-Medical): No  Physical Activity: Inactive (07/01/2022)   Exercise Vital Sign    Days of Exercise per Week: 0 days    Minutes of Exercise per Session: 0 min  Stress: No Stress Concern Present (07/01/2022)   [place]    Feeling of Stress : Not at all  Social Connections: Moderately Isolated (07/01/2022)   Social Connection and Isolation Panel [NHANES]    Frequency of Communication with Friends and Family: More than three times a week    Frequency of Social Gatherings with Friends and Family: Three times a week    Attends Religious Services: 1 to 4 times per year    Active Member of Clubs or Organizations: No    Attends Archivist Meetings: Never    Marital  Status: Widowed    Tobacco Counseling Counseling given: Not Answered   Clinical Intake:  Pre-visit preparation completed: Yes  Pain : 0-10 Pain Score: 3  Pain Type: Chronic pain Pain Location:  (left side) Pain Orientation: Left Pain Descriptors / Indicators: Aching, Burning, Discomfort Pain Onset: More than a month ago Pain Frequency: Intermittent     Diabetes: No  How often do you need to have someone help you when you read instructions, pamphlets, or other written materials from your doctor or pharmacy?: 1 -  Never  Diabetic?   Nutrition Risk Assessment:     Interpreter Needed?: No  Comments: patient is bedridden for the last 3 years. Information entered by :: Leroy Kennedy LPN   Activities of Daily Living    07/01/2022   10:24 AM 12/20/2021   10:20 AM  In your present state of health, do you have any difficulty performing the following activities:  Hearing? 0   Vision? 0   Difficulty concentrating or making decisions? 0   Walking or climbing stairs? 1   Dressing or bathing? 1   Doing errands, shopping? 1 1  Preparing Food and eating ? Y   Using the Toilet? Y   Comment patient wears depends.  bedridden   In the past six months, have you accidently leaked urine? Y   Do you have problems with loss of bowel control? Y   Comment has had loose stools   Managing your Medications? Y   Managing your Finances? Y   Housekeeping or managing your Housekeeping? Y     Patient Care Team: Girtha Rm, NP-C as PCP - General (Family Medicine)  Indicate any recent Medical Services you may have received from other than [facility] providers in thnoe past year (date may be approximate).     Assessment:   This is a routine wellness examination for [facility].  Hearing/Vision screen Hearing Screening - Comments:: No trouble hearing Vision Screening - Comments:: Not up to date  Dietary issues and exercise activities discussed: Current Exercise Habits: The patient does not  participate in regular exercise at present, Exercise limited by: orthopedic condition(s) (polio as a child is bedridden)   Goals Addressed             This Visit's Progress    Patient Stated       Would like to be walking       Depression Screen    07/01/2022   10:08 AM 01/24/2022    3:19 PM  PHQ 2/9 Scores  PHQ - 2 Score 0 2  PHQ- 9 Score 0 6    Fall Risk    07/01/2022   10:01 AM 01/24/2022    3:20 PM  Advance in the past year? 0 0  Number falls in past yr: 0 0  Injury with Fall? 0 0  Risk for fall due to : Impaired balance/gait;Impaired mobility Impaired balance/gait;Impaired mobility  Follow up Falls evaluation completed;Education provided;Falls prevention discussed Falls evaluation completed    FALL RISK PREVENTION PERTAINING TO THE HOME:  Any stairs in or around the home? No  If so, are there any without handrails? No  Home free of loose throw rugs in walkways, pet beds, electrical cords, etc? Yes  Adequate lighting in your home to reduce risk of falls? Yes   ASSISTIVE DEVICES UTILIZED TO PREVENT FALLS:  Life alert? No  Use of a cane, walker or w/c? Yes  Grab bars in the bathroom? Yes  Shower chair or bench in shower? Yes  Elevated toilet seat or a handicapped toilet? Yes   TIMED UP AND GO:  Was the test performed? No .    Cognitive Function:        07/01/2022   10:02 AM  6CIT Screen  What Year? 0 points  What month? 0 points  What time? 0 points  Count back from 20 0 points  Months in reverse 4 points  Repeat phrase 10 points  Total Score 14 points    Immunizations  There is no immunization history on file for this patient.  TDAP status: Due, Education has been provided regarding the importance of this vaccine. Advised may receive this vaccine at local pharmacy or Health Dept. Aware to provide a copy of the vaccination record if obtained from local pharmacy or Health Dept. Verbalized acceptance and understanding.  Flu Vaccine  status: Declined, Education has been provided regarding the importance of this vaccine but patient still declined. Advised may receive this vaccine at local pharmacy or Health Dept. Aware to provide a copy of the vaccination record if obtained from local pharmacy or Health Dept. Verbalized acceptance and understanding.  Pneumococcal vaccine status: Declined,  Education has been provided regarding the importance of this vaccine but patient still declined. Advised may receive this vaccine at local pharmacy or Health Dept. Aware to provide a copy of the vaccination record if obtained from local pharmacy or Health Dept. Verbalized acceptance and understanding.   Covid-19 vaccine status: Declined, Education has been provided regarding the importance of this vaccine but patient still declined. Advised may receive this vaccine at local pharmacy or Health Dept.or vaccine clinic. Aware to provide a copy of the vaccination record if obtained from local pharmacy or Health Dept. Verbalized acceptance and understanding.  Qualifies for Shingles Vaccine? Yes   Zostavax completed No   Shingrix Completed?: No.    Education has been provided regarding the importance of this vaccine. Patient has been advised to call insurance company to determine out of pocket expense if they have not yet received this vaccine. Advised may also receive vaccine at local pharmacy or Health Dept. Verbalized acceptance and understanding.  Screening Tests Health Maintenance  Topic Date Due   FOOT EXAM  Never done   OPHTHALMOLOGY EXAM  Never done   Diabetic kidney evaluation - Urine ACR  Never done   Hepatitis C Screening  Never done   DTaP/Tdap/Td (1 - Tdap) Never done   COVID-19 Vaccine (1) 07/17/2022 (Originally 12/04/1950)   Zoster Vaccines- Shingrix (1 of 2) 09/30/2022 (Originally 12/05/1964)   INFLUENZA VACCINE  10/26/2022 (Originally 02/25/2022)   Pneumonia Vaccine 43+ Years old (1 - PCV) 07/02/2023 (Originally 12/04/2008)    HEMOGLOBIN A1C  12/22/2022   Diabetic kidney evaluation - GFR measurement  06/24/2023   Medicare Annual Wellness (AWV)  07/02/2023   HPV VACCINES  Aged Out   COLONOSCOPY (Pts 45-9yr Insurance coverage will need to be confirmed)  Discontinued    Health Maintenance  Health Maintenance Due  Topic Date Due   FOOT EXAM  Never done   OPHTHALMOLOGY EXAM  Never done   Diabetic kidney evaluation - Urine ACR  Never done   Hepatitis C Screening  Never done   DTaP/Tdap/Td (1 - Tdap) Never done    Colonoscopy declined  Lung Cancer Screening: (Low Dose CT Chest recommended if Age 65-80years, 30 pack-year currently smoking OR have quit w/in 15years.) does not qualify.   Lung Cancer Screening Referral:   Additional Screening:  Hepatitis C Screening: does qualify  Vision Screening: Recommended annual ophthalmology exams for early detection of glaucoma and other disorders of the eye. Is the patient up to date with their annual eye exam?  No  Who is the provider or what is the name of the office in which the patient attends annual eye exams?  If pt is not established with a provider, would they like to be referred to a provider to establish care? No .   Dental Screening: Recommended annual dental exams for  proper oral hygiene  Community Resource Referral / Chronic Care Management: CRR required this visit?  No   CCM required this visit?  No      Plan:     I have personally reviewed and noted the following in the patient's chart:   Medical and social history Use of alcohol, tobacco or illicit drugs  Current medications and supplements including opioid prescriptions. Patient is not currently taking opioid prescriptions. Functional ability and status Nutritional status Physical activity Advanced directives List of other physicians Hospitalizations, surgeries, and ER visits in previous 12 months Vitals Screenings to include cognitive, depression, and falls Referrals and  appointments  In addition, I have reviewed and discussed with patient certain preventive protocols, quality metrics, and best practice recommendations. A written personalized care plan for preventive services as well as general preventive health recommendations were provided to patient.     Leroy Kennedy, LPN   [id number]   Nurse Notes:

## 2022-07-01 NOTE — Patient Instructions (Signed)
Mr. Eduardo Moon , Thank you for taking time to come for your Medicare Wellness Visit. I appreciate your ongoing commitment to your health goals. Please review the following plan we discussed and let me know if I can assist you in the future.   These are the goals we discussed:  Goals      Patient Stated     Would like to be walking        This is a list of the screening recommended for you and due dates:  Health Maintenance  Topic Date Due   Complete foot exam   Never done   Eye exam for diabetics  Never done   Yearly kidney health urinalysis for diabetes  Never done   Hepatitis C Screening: USPSTF Recommendation to screen - Ages 14-78 yo.  Never done   DTaP/Tdap/Td vaccine (1 - Tdap) Never done   COVID-19 Vaccine (1) 07/17/2022*   Zoster (Shingles) Vaccine (1 of 2) 09/30/2022*   Flu Shot  10/26/2022*   Pneumonia Vaccine (1 - PCV) 07/02/2023*   Hemoglobin A1C  12/22/2022   Yearly kidney function blood test for diabetes  06/24/2023   Medicare Annual Wellness Visit  07/02/2023   HPV Vaccine  Aged Out   Colon Cancer Screening  Discontinued  *Topic was postponed. The date shown is not the original due date.    Advanced directives: not on file  Conditions/risks identified Polio at 1 yrs  Bedridden   Next appointment: Follow up in one year for your annual wellness visit. 09-23-2022 @ 11:20  [place] 78 Years and Older, Male  Preventive care refers to lifestyle choices and visits with your health care provider that can promote health and wellness. What does preventive care include? A yearly physical exam. This is also called an annual well check. Dental exams once or twice a year. Routine eye exams. Ask your health care provider how often you should have your eyes checked. Personal lifestyle choices, including: Daily care of your teeth and gums. Regular physical activity. Eating a healthy diet. Avoiding tobacco and drug use. Limiting alcohol use. Practicing safe  sex. Taking low doses of aspirin every day. Taking vitamin and mineral supplements as recommended by your health care provider. What happens during an annual well check? The services and screenings done by your health care provider during your annual well check will depend on your age, overall health, lifestyle risk factors, and family history of disease. Counseling  Your health care provider may ask you questions about your: Alcohol use. Tobacco use. Drug use. Emotional well-being. Home and relationship well-being. Sexual activity. Eating habits. History of falls. Memory and ability to understand (cognition). Work and work Statistician. Screening  You may have the following tests or measurements: Height, weight, and BMI. Blood pressure. Lipid and cholesterol levels. These may be checked every 5 years, or more frequently if you are over 78 years old. Skin check. Lung cancer screening. You may have this screening every year starting at age 78 if you have a 30-pack-year history of smoking and currently smoke or have quit within the past 15 years. Fecal occult blood test (FOBT) of the stool. You may have this test every year starting at age 78. Flexible sigmoidoscopy or colonoscopy. You may have a sigmoidoscopy every 5 years or a colonoscopy every 10 years starting at age 78. Prostate cancer screening. Recommendations will vary depending on your family history and other risks. Hepatitis C blood test. Hepatitis B blood test. Sexually transmitted disease (  STD) testing. Diabetes screening. This is done by checking your blood sugar (glucose) after you have not eaten for a while (fasting). You may have this done every 1-3 years. Abdominal aortic aneurysm (AAA) screening. You may need this if you are a current or former smoker. Osteoporosis. You may be screened starting at age 78 if you are at high risk. Talk with your health care provider about your test results, treatment options, and if  necessary, the need for more tests. Vaccines  Your health care provider may recommend certain vaccines, such as: Influenza vaccine. This is recommended every year. Tetanus, diphtheria, and acellular pertussis (Tdap, Td) vaccine. You may need a Td booster every 10 years. Zoster vaccine. You may need this after age 78. Pneumococcal 13-valent conjugate (PCV13) vaccine. One dose is recommended after age 78. Pneumococcal polysaccharide (PPSV23) vaccine. One dose is recommended after age 78. Talk to your health care provider about which screenings and vaccines you need and how often you need them. This information is not intended to replace advice given to you by your health care provider. Make sure you discuss any questions you have with your health care provider. Document Released: 08/10/2015 Document Revised: 04/02/2016 Document Reviewed: 05/15/2015 Elsevier Interactive Patient Education  2017 [place] Prevention in the Home Falls can cause injuries. They can happen to people of all ages. There are many things you can do to make your home safe and to help prevent falls. What can I do on the outside of my home? Regularly fix the edges of walkways and driveways and fix any cracks. Remove anything that might make you trip as you walk through a door, such as a raised step or threshold. Trim any bushes or trees on the path to your home. Use bright outdoor lighting. Clear any walking paths of anything that might make someone trip, such as rocks or tools. Regularly check to see if handrails are loose or broken. Make sure that both sides of any steps have handrails. Any raised decks and porches should have guardrails on the edges. Have any leaves, snow, or ice cleared regularly. Use sand or salt on walking paths during winter. Clean up any spills in your garage right away. This includes oil or grease spills. What can I do in the bathroom? Use night lights. Install grab bars by the toilet  and in the tub and shower. Do not use towel bars as grab bars. Use non-skid mats or decals in the tub or shower. If you need to sit down in the shower, use a plastic, non-slip stool. Keep the floor dry. Clean up any water that spills on the floor as soon as it happens. Remove soap buildup in the tub or shower regularly. Attach bath mats securely with double-sided non-slip rug tape. Do not have throw rugs and other things on the floor that can make you trip. What can I do in the bedroom? Use night lights. Make sure that you have a light by your bed that is easy to reach. Do not use any sheets or blankets that are too big for your bed. They should not hang down onto the floor. Have a firm chair that has side arms. You can use this for support while you get dressed. Do not have throw rugs and other things on the floor that can make you trip. What can I do in the kitchen? Clean up any spills right away. Avoid walking on wet floors. Keep items that you use a lot  in easy-to-reach places. If you need to reach something above you, use a strong step stool that has a grab bar. Keep electrical cords out of the way. Do not use floor polish or wax that makes floors slippery. If you must use wax, use non-skid floor wax. Do not have throw rugs and other things on the floor that can make you trip. What can I do with my stairs? Do not leave any items on the stairs. Make sure that there are handrails on both sides of the stairs and use them. Fix handrails that are broken or loose. Make sure that handrails are as long as the stairways. Check any carpeting to make sure that it is firmly attached to the stairs. Fix any carpet that is loose or worn. Avoid having throw rugs at the top or bottom of the stairs. If you do have throw rugs, attach them to the floor with carpet tape. Make sure that you have a light switch at the top of the stairs and the bottom of the stairs. If you do not have them, ask someone to add  them for you. What else can I do to help prevent falls? Wear shoes that: Do not have high heels. Have rubber bottoms. Are comfortable and fit you well. Are closed at the toe. Do not wear sandals. If you use a stepladder: Make sure that it is fully opened. Do not climb a closed stepladder. Make sure that both sides of the stepladder are locked into place. Ask someone to hold it for you, if possible. Clearly mark and make sure that you can see: Any grab bars or handrails. First and last steps. Where the edge of each step is. Use tools that help you move around (mobility aids) if they are needed. These include: Canes. Walkers. Scooters. Crutches. Turn on the lights when you go into a dark area. Replace any light bulbs as soon as they burn out. Set up your furniture so you have a clear path. Avoid moving your furniture around. If any of your floors are uneven, fix them. If there are any pets around you, be aware of where they are. Review your medicines with your doctor. Some medicines can make you feel dizzy. This can increase your chance of falling. Ask your doctor what other things that you can do to help prevent falls. This information is not intended to replace advice given to you by your health care provider. Make sure you discuss any questions you have with your health care provider. Document Released: 05/10/2009 Document Revised: 12/20/2015 Document Reviewed: 08/18/2014 Elsevier Interactive Patient Education  2017 [employer].

## 2022-07-10 ENCOUNTER — Other Ambulatory Visit: Payer: Self-pay | Admitting: Family Medicine

## 2022-07-10 DIAGNOSIS — R197 Diarrhea, unspecified: Secondary | ICD-10-CM

## 2022-07-14 ENCOUNTER — Other Ambulatory Visit: Payer: Self-pay | Admitting: Family Medicine

## 2022-07-14 DIAGNOSIS — E785 Hyperlipidemia, unspecified: Secondary | ICD-10-CM

## 2022-07-19 ENCOUNTER — Other Ambulatory Visit: Payer: Self-pay | Admitting: Family Medicine

## 2022-07-19 DIAGNOSIS — M1990 Unspecified osteoarthritis, unspecified site: Secondary | ICD-10-CM

## 2022-07-19 DIAGNOSIS — M109 Gout, unspecified: Secondary | ICD-10-CM

## 2022-07-22 NOTE — Telephone Encounter (Signed)
Pts daughter called back and clarified pt takes it daily at bedtime for his arthritis. I relayed vickie's response that it is not safe to take daily or several days a week due to kidney and liver. She would like to know if he should just do the naproxen or what she recommends for him to do going forward?

## 2022-07-22 NOTE — Telephone Encounter (Signed)
Would you like to still keep pt on this medication?

## 2022-07-22 NOTE — Telephone Encounter (Signed)
LM for pt to return my call to verify how often he is taking and for what pain

## 2022-07-24 NOTE — Telephone Encounter (Signed)
LM for daughter informing we will be placing ortho referral and for pt to use tylenol and naproxen instead. Provided office number for questions/concerns.

## 2022-07-24 NOTE — Addendum Note (Signed)
Addended by: Rossie Muskrat on: 07/24/2022 09:10 AM   Modules accepted: Orders

## 2022-09-05 ENCOUNTER — Other Ambulatory Visit: Payer: Self-pay | Admitting: Family Medicine

## 2022-09-13 ENCOUNTER — Other Ambulatory Visit: Payer: Self-pay | Admitting: Family Medicine

## 2022-09-13 DIAGNOSIS — R197 Diarrhea, unspecified: Secondary | ICD-10-CM

## 2022-09-19 ENCOUNTER — Other Ambulatory Visit: Payer: Self-pay | Admitting: Family Medicine

## 2022-09-19 DIAGNOSIS — I10 Essential (primary) hypertension: Secondary | ICD-10-CM

## 2022-09-23 ENCOUNTER — Encounter: Payer: Self-pay | Admitting: Family Medicine

## 2022-09-23 ENCOUNTER — Ambulatory Visit (INDEPENDENT_AMBULATORY_CARE_PROVIDER_SITE_OTHER): Payer: 59 | Admitting: Family Medicine

## 2022-09-23 VITALS — BP 134/72 | HR 88 | Temp 97.6°F | Ht 66.0 in

## 2022-09-23 DIAGNOSIS — N390 Urinary tract infection, site not specified: Secondary | ICD-10-CM | POA: Diagnosis not present

## 2022-09-23 DIAGNOSIS — M109 Gout, unspecified: Secondary | ICD-10-CM

## 2022-09-23 DIAGNOSIS — I10 Essential (primary) hypertension: Secondary | ICD-10-CM

## 2022-09-23 DIAGNOSIS — Z7409 Other reduced mobility: Secondary | ICD-10-CM

## 2022-09-23 DIAGNOSIS — R29898 Other symptoms and signs involving the musculoskeletal system: Secondary | ICD-10-CM | POA: Insufficient documentation

## 2022-09-23 DIAGNOSIS — E782 Mixed hyperlipidemia: Secondary | ICD-10-CM | POA: Diagnosis not present

## 2022-09-23 DIAGNOSIS — Z8612 Personal history of poliomyelitis: Secondary | ICD-10-CM

## 2022-09-23 LAB — BASIC METABOLIC PANEL
BUN: 28 mg/dL — ABNORMAL HIGH (ref 6–23)
CO2: 23 mEq/L (ref 19–32)
Calcium: 9.2 mg/dL (ref 8.4–10.5)
Chloride: 110 mEq/L (ref 96–112)
Creatinine, Ser: 1.22 mg/dL (ref 0.40–1.50)
GFR: 56.67 mL/min — ABNORMAL LOW (ref >60.00)
Glucose, Bld: 111 mg/dL — ABNORMAL HIGH (ref 70–99)
Potassium: 3.6 mEq/L (ref 3.5–5.1)
Sodium: 141 mEq/L (ref 135–145)

## 2022-09-23 LAB — LIPID PANEL
Cholesterol: 142 mg/dL (ref 0–200)
HDL: 46.7 mg/dL (ref >39.00)
LDL Cholesterol: 74 mg/dL (ref 0–99)
NonHDL: 95.43
Total CHOL/HDL Ratio: 3
Triglycerides: 108 mg/dL (ref 0.0–149.0)
VLDL: 21.6 mg/dL (ref 0.0–40.0)

## 2022-09-23 NOTE — Assessment & Plan Note (Signed)
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 Offered PT, declines currently. His family member is working with him on leg strengthening exercises. He has done PT in past.

## 2022-09-23 NOTE — Assessment & Plan Note (Addendum)
Continue good compliance with amlodipine 5 mg daily, lisinopril 30 mg daily, metoprolol XL 100 mg.  BP controlled. Check BMP

## 2022-09-23 NOTE — Assessment & Plan Note (Signed)
He will see urologist later today.

## 2022-09-23 NOTE — Progress Notes (Signed)
Subjective:     Patient ID: Eduardo Moon, male    DOB: 08-Jan-1946, 77 y.o.   MRN: [id number]  Chief Complaint  Patient presents with   Follow-up    HPI Patient is in today for 3 month f/u on chronic health conditions.   Leg weakness - chronic. Uses wheelchair. Requests order for transport chair.  Hx of polio. He has done PT in past. Declines for now.   HL- LDL 154 3 months ago. Started back on statin. Doing well.   HTN-  taking amlodipine 5 mg daily, lisinopril 30 mg daily, metoprolol XL 100 mg.   Elevated serum Cr 1.51   DM- A1c 5.8% 3 months ago   Prostate cancer hx- states he is seeing [facility] later today. Hx of recurrent UTI.   Gout- uric acid 6.7 and taking allopurinol  Denies gout flares  Denies fever, chills, dizziness, chest pain, palpitations, shortness of breath, abdominal pain, N/V/D.      Health Maintenance Due  Topic Date Due   FOOT EXAM  Never done   OPHTHALMOLOGY EXAM  Never done   Diabetic kidney evaluation - Urine ACR  Never done   Hepatitis C Screening  Never done   DTaP/Tdap/Td (1 - Tdap) Never done    Past Medical History:  Diagnosis Date   Arthritis    History of prostate cancer s/p radiactive seed implants  03-24-2011   Hypertension    Mild acid reflux watches diet   Polio age 58   Polio    Scrotal abscess    Weakness 05/2020   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   uses w/c for distance    Past Surgical History:  Procedure Laterality Date   [place]   BILATERAL   ORCHIECTOMY  05/07/2012   Procedure: ORCHIECTOMY;  Surgeon: Bernestine Amass, MD;  Location: [facility];  Service: Urology;  Laterality: Right;   RADIOACTIVE PROSTATE SEED IMPLANTS  04-21-2011    DR Risa Grill   PROSTATE CANCER   SCROTAL EXPLORATION  05/07/2012   Procedure: SCROTUM EXPLORATION;  Surgeon: Bernestine Amass, MD;  Location: [facility];  Service: Urology;  Laterality: Right;  DRAINAGE OF ABSCESS  1  HR  UHC MEDICARE    Family History  Problem Relation Age of Onset   Cancer Mother    Cancer Father    Cancer Brother     Social History   Socioeconomic History   Marital status: Widowed    Spouse name: Not on file   Number of children: Not on file   Years of education: Not on file   Highest education level: Not on file  Occupational History   Not on file  Tobacco Use   Smoking status: Never   Smokeless tobacco: Never  Vaping Use   Vaping Use: Never used  Substance and Sexual Activity   Alcohol use: No   Drug use: No   Sexual activity: Not Currently  Other Topics Concern   Not on file  Social History Narrative   Not on file   Social Determinants of Health   Financial Resource Strain: Low Risk  (07/01/2022)   Overall Financial Resource Strain (CARDIA)    Difficulty of Paying Living Expenses: Not hard at all  Food Insecurity: No Food Insecurity (07/01/2022)   Hunger Vital Sign    Worried About Running Out of Food in the Last Year: Never true    Ran Out of Food in the Last Year: Never  true  Transportation Needs: No Transportation Needs (07/01/2022)   PRAPARE - Hydrologist (Medical): No    Lack of Transportation (Non-Medical): No  Physical Activity: Inactive (07/01/2022)   Exercise Vital Sign    Days of Exercise per Week: 0 days    Minutes of Exercise per Session: 0 min  Stress: No Stress Concern Present (07/01/2022)   [place]    Feeling of Stress : Not at all  Social Connections: Moderately Isolated (07/01/2022)   Social Connection and Isolation Panel [NHANES]    Frequency of Communication with Friends and Family: More than three times a week    Frequency of Social Gatherings with Friends and Family: Three times a week    Attends Religious Services: 1 to 4 times per year    Active Member of Clubs or Organizations: No    Attends Archivist Meetings: Never     Marital Status: Widowed  Intimate Partner Violence: Not At Risk (07/01/2022)   Humiliation, Afraid, Rape, and Kick questionnaire    Fear of Current or Ex-Partner: No    Emotionally Abused: No    Physically Abused: No    Sexually Abused: No    Outpatient Medications Prior to Visit  Medication Sig Dispense Refill   albuterol (PROVENTIL) (2.5 MG/3ML) 0.083% nebulizer solution Take 3 mLs (2.5 mg total) by nebulization every 6 (six) hours as needed for wheezing or shortness of breath. 75 mL 1   albuterol (VENTOLIN HFA) 108 (90 Base) MCG/ACT inhaler Inhale 2 puffs into the lungs every 6 (six) hours as needed for wheezing or shortness of breath. 8 g 2   allopurinol (ZYLOPRIM) 300 MG tablet Take 1 tablet (300 mg total) by mouth daily. 30 tablet 2   amLODipine (NORVASC) 5 MG tablet TAKE 1 TABLET (5 MG TOTAL) BY MOUTH DAILY. 90 tablet 0   Diclofenac Sodium CR 100 MG 24 hr tablet TAKE 1 TABLET BY MOUTH EVERYDAY AT BEDTIME. PLEASE SCHEDULE APPOINTMENT FOR FUTURE REFILLS. 30 tablet 0   lisinopril (ZESTRIL) 30 MG tablet Take 30 mg by mouth daily.     metoprolol succinate (TOPROL-XL) 100 MG 24 hr tablet TAKE 1 TABLET BY MOUTH EVERY DAY 90 tablet 0   omeprazole (PRILOSEC) 20 MG capsule TAKE 1 CAPSULE BY MOUTH TWICE A DAY 180 capsule 0   polyethylene glycol (MIRALAX / GLYCOLAX) 17 g packet Take 17 g by mouth 2 (two) times daily. 14 each 3   rosuvastatin (CRESTOR) 5 MG tablet TAKE 1 TABLET BY MOUTH EVERY DAY IN THE EVENING. 30 tablet 2   sertraline (ZOLOFT) 50 MG tablet Take 1 tablet (50 mg total) by mouth daily. 90 tablet 1   tamsulosin (FLOMAX) 0.4 MG CAPS capsule Take 1 capsule (0.4 mg total) by mouth daily. 30 capsule 0   No facility-administered medications prior to visit.    No Known Allergies  ROS     Objective:    Physical Exam Constitutional:      General: He is not in acute distress.    Appearance: He is not ill-appearing.  HENT:     Mouth/Throat:     Mouth: Mucous membranes are moist.   Eyes:     Conjunctiva/sclera: Conjunctivae normal.  Cardiovascular:     Rate and Rhythm: Normal rate and regular rhythm.  Pulmonary:     Effort: Pulmonary effort is normal.     Breath sounds: Normal breath sounds.  Musculoskeletal:  Comments: Bilateral LE weakness. Neurovascularly intact.   Skin:    General: Skin is warm and dry.  Neurological:     Mental Status: He is alert and oriented to person, place, and time.     Sensory: No sensory deficit.  Psychiatric:        Mood and Affect: Mood normal.        Behavior: Behavior normal.        Thought Content: Thought content normal.     BP 134/72 (BP Location: Left Arm, Patient Position: Sitting, Cuff Size: Large)   Pulse 88   Temp 97.6 F (36.4 C) (Temporal)   Ht '5\' 6"'$  (1.676 m)   SpO2 99%   BMI 25.99 kg/m  Wt Readings from Last 3 Encounters:  12/16/21 161 lb (73 kg)  11/07/20 153 lb 9.6 oz (69.7 kg)  11/01/20 149 lb (67.6 kg)       Assessment & Plan:   Problem List Items Addressed This Visit       Cardiovascular and Mediastinum   Essential hypertension - Primary    Continue good compliance with amlodipine 5 mg daily, lisinopril 30 mg daily, metoprolol XL 100 mg.  BP controlled. Check BMP      Relevant Orders   Basic metabolic panel (Completed)     Musculoskeletal and Integument   Gouty arthritis    No flares. Uric acid 6.7. continue allopurinol.         Genitourinary   Recurrent UTI    He will see urologist later today.         Other   Bilateral leg weakness    Chronic since having polio as child and hx of sepsis and severe gout. Offered PT, declines currently. His family member is working with him on leg strengthening exercises. He has done PT in past.        Relevant Orders   For home use only DME Other see comment   History of poliomyelitis   Relevant Orders   For home use only DME Other see comment   HLD (hyperlipidemia)    Started back on statin therapy. Recheck lipids today and follow  up.       Relevant Orders   Lipid panel (Completed)   Limited mobility    Transport chair ordered. Regular manual wheelchair difficult for family to maneuver and lift into car while out of house.       Relevant Orders   For home use only DME Other see comment    I am having Adele Schilder "Del" maintain his polyethylene glycol, lisinopril, tamsulosin, albuterol, albuterol, Diclofenac Sodium CR, sertraline, allopurinol, rosuvastatin, omeprazole, amLODipine, and metoprolol succinate.  No orders of the defined types were placed in this encounter.

## 2022-09-23 NOTE — Patient Instructions (Signed)
Please go downstairs for labs.

## 2022-09-23 NOTE — Assessment & Plan Note (Signed)
Transport chair ordered. Regular manual wheelchair difficult for family to maneuver and lift into car while out of house.

## 2022-09-23 NOTE — Assessment & Plan Note (Signed)
No flares. Uric acid 6.7. continue allopurinol.

## 2022-09-23 NOTE — Assessment & Plan Note (Signed)
Started back on statin therapy. Recheck lipids today and follow up.

## 2022-09-28 ENCOUNTER — Other Ambulatory Visit: Payer: Self-pay | Admitting: Family Medicine

## 2022-10-08 ENCOUNTER — Other Ambulatory Visit: Payer: Self-pay | Admitting: Family Medicine

## 2022-10-09 ENCOUNTER — Other Ambulatory Visit: Payer: Self-pay

## 2022-10-09 MED ORDER — LISINOPRIL 30 MG PO TABS
30.0000 mg | ORAL_TABLET | Freq: Every day | ORAL | 1 refills | Status: DC
  Filled 2022-10-09: qty 90, 90d supply, fill #0

## 2022-10-13 ENCOUNTER — Telehealth: Payer: Self-pay | Admitting: Family Medicine

## 2022-10-13 NOTE — Telephone Encounter (Signed)
Caller & Relationship to patient: Daughter (is aware pt needs to complete DPR at next visit)  Call back number: [phone]  Date of last office visit: 09-23-22  Date of next office visit: N/A  Medication(s) to be refilled:  albuterol (VENTOLIN HFA) 108 (90 Base) MCG/ACT inhaler  lisinopril (ZESTRIL) 30 MG tablet  naproxen (NAPROSYN) 375 MG tablet   Vitamin D, Ergocalciferol, (DRISDOL) 1.25 MG (50000 UNIT) CAPS capsule   Preferred Pharmacy: [id number] - [place], [place] - [address]

## 2022-10-14 MED ORDER — NAPROXEN 375 MG PO TABS: ORAL_TABLET | ORAL | 2 refills | Status: DC

## 2022-10-14 MED ORDER — ALBUTEROL SULFATE HFA 108 (90 BASE) MCG/ACT IN AERS: 2.0000 | INHALATION_SPRAY | Freq: Four times a day (QID) | RESPIRATORY_TRACT | 2 refills | Status: DC | PRN

## 2022-10-14 NOTE — Telephone Encounter (Signed)
Albuterol refill sent, lisinopril refill sent 3/14   LM for daughter relaying info below

## 2022-10-14 NOTE — Telephone Encounter (Signed)
Albuterol was last filled by Dr. Erlinda Hong at the hospital.. ok to refill?   Looks like we discontinued naproxen due to kidney function and Vitamin D last checked 8 months ago (within normal range) and you wanted him to continue current dose. Do you want to refill 1.25 mg dosage?

## 2022-10-14 NOTE — Addendum Note (Signed)
Addended by: Rossie Muskrat on: 10/14/2022 04:35 PM   Modules accepted: Orders

## 2022-10-14 NOTE — Telephone Encounter (Signed)
Called and notified daughter [name], she verbalized understanding

## 2022-10-14 NOTE — Telephone Encounter (Signed)
Daughter called back and reports pt has been taking Naproxen since last refill request of Diclofenac refill was not sent in for a while ( looks like it was denied 2023 because he needed ov but has been seen since then). She would like to know if he needs to stop the naproxen so that we can refill dicofenac or continue and refill naproxen?

## 2022-10-23 ENCOUNTER — Other Ambulatory Visit: Payer: Self-pay | Admitting: Family Medicine

## 2022-10-23 DIAGNOSIS — M109 Gout, unspecified: Secondary | ICD-10-CM

## 2022-12-08 ENCOUNTER — Other Ambulatory Visit: Payer: Self-pay | Admitting: Family Medicine

## 2022-12-08 DIAGNOSIS — F419 Anxiety disorder, unspecified: Secondary | ICD-10-CM

## 2022-12-08 NOTE — Telephone Encounter (Signed)
LOV: 09/23/22 Last fill: 06/23/22, 90 tablets 1 refill

## 2022-12-14 ENCOUNTER — Other Ambulatory Visit: Payer: Self-pay | Admitting: Family Medicine

## 2022-12-14 DIAGNOSIS — I10 Essential (primary) hypertension: Secondary | ICD-10-CM

## 2022-12-14 DIAGNOSIS — E785 Hyperlipidemia, unspecified: Secondary | ICD-10-CM

## 2023-01-05 ENCOUNTER — Other Ambulatory Visit: Payer: Self-pay

## 2023-01-05 ENCOUNTER — Emergency Department (HOSPITAL_COMMUNITY): Payer: 59

## 2023-01-05 ENCOUNTER — Emergency Department (HOSPITAL_COMMUNITY)
Admission: EM | Admit: 2023-01-05 | Discharge: 2023-01-06 | Disposition: A | Discharge status: Home / Self Care | Payer: 59 | Attending: Emergency Medicine | Admitting: Emergency Medicine

## 2023-01-05 ENCOUNTER — Encounter (HOSPITAL_COMMUNITY): Payer: Self-pay | Admitting: Emergency Medicine

## 2023-01-05 ENCOUNTER — Other Ambulatory Visit: Payer: Self-pay | Admitting: Family Medicine

## 2023-01-05 DIAGNOSIS — Z7984 Long term (current) use of oral hypoglycemic drugs: Secondary | ICD-10-CM | POA: Diagnosis not present

## 2023-01-05 DIAGNOSIS — K644 Residual hemorrhoidal skin tags: Secondary | ICD-10-CM | POA: Insufficient documentation

## 2023-01-05 DIAGNOSIS — R531 Weakness: Secondary | ICD-10-CM | POA: Diagnosis not present

## 2023-01-05 DIAGNOSIS — I700 Atherosclerosis of aorta: Secondary | ICD-10-CM | POA: Diagnosis not present

## 2023-01-05 DIAGNOSIS — D649 Anemia, unspecified: Secondary | ICD-10-CM | POA: Insufficient documentation

## 2023-01-05 DIAGNOSIS — I10 Essential (primary) hypertension: Secondary | ICD-10-CM | POA: Insufficient documentation

## 2023-01-05 DIAGNOSIS — R109 Unspecified abdominal pain: Secondary | ICD-10-CM | POA: Diagnosis not present

## 2023-01-05 DIAGNOSIS — R110 Nausea: Secondary | ICD-10-CM | POA: Diagnosis not present

## 2023-01-05 DIAGNOSIS — D72829 Elevated white blood cell count, unspecified: Secondary | ICD-10-CM | POA: Diagnosis not present

## 2023-01-05 DIAGNOSIS — R197 Diarrhea, unspecified: Secondary | ICD-10-CM | POA: Insufficient documentation

## 2023-01-05 DIAGNOSIS — Z79899 Other long term (current) drug therapy: Secondary | ICD-10-CM | POA: Diagnosis not present

## 2023-01-05 DIAGNOSIS — E119 Type 2 diabetes mellitus without complications: Secondary | ICD-10-CM | POA: Insufficient documentation

## 2023-01-05 DIAGNOSIS — Z8546 Personal history of malignant neoplasm of prostate: Secondary | ICD-10-CM | POA: Diagnosis not present

## 2023-01-05 DIAGNOSIS — R1084 Generalized abdominal pain: Secondary | ICD-10-CM | POA: Diagnosis not present

## 2023-01-05 LAB — POC OCCULT BLOOD, ED: Fecal Occult Bld: NEGATIVE

## 2023-01-05 MED ORDER — PANTOPRAZOLE SODIUM 40 MG IV SOLR
40.0000 mg | Freq: Once | INTRAVENOUS | Status: AC
  Administered 2023-01-05: 40 mg via INTRAVENOUS
  Filled 2023-01-05: qty 10

## 2023-01-05 MED ORDER — SODIUM CHLORIDE 0.9 % IV SOLN: INTRAVENOUS | Status: DC

## 2023-01-05 NOTE — ED Notes (Signed)
Pt. Transported to xray 

## 2023-01-05 NOTE — ED Provider Notes (Signed)
[place] EMERGENCY DEPARTMENT AT [facility] Provider Note  CSN: [id number] Arrival date & time: 01/05/23 2224  Chief Complaint(s) Diarrhea  HPI Eduardo Moon is a 77 y.o. male     Diarrhea Quality:  Black and tarry Severity:  Moderate Onset quality:  Gradual Duration:  3 days Timing:  Constant Progression:  Unchanged Relieved by:  Nothing Worsened by:  Nothing Associated symptoms: no abdominal pain, no chills, no diaphoresis, no fever, no URI and no vomiting   Risk factors: no recent antibiotic use, no sick contacts and no suspicious food intake    Patient reports taking medicine from a white bottle.  He does not remember what it is.   Past Medical History Past Medical History:  Diagnosis Date   Arthritis    History of prostate cancer s/p radiactive seed implants  03-24-2011   Hypertension    Mild acid reflux watches diet   Polio age 58   Polio    Scrotal abscess    Weakness 05/2020   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   uses w/c for distance   Patient Active Problem List   Diagnosis Date Noted   History of poliomyelitis 09/23/2022   Bilateral leg weakness 09/23/2022   History of gout 06/23/2022   Medication management 06/23/2022   History of diabetes mellitus, type II 06/23/2022   Iron deficiency anemia 01/24/2022   History of prostate cancer 01/24/2022   Anxiety 01/24/2022   Hx of sepsis 01/24/2022   Vitamin D deficiency 01/24/2022   Severe sepsis (HCC) 12/16/2021   Sepsis secondary to UTI (HCC) 12/16/2021   Acute metabolic encephalopathy 12/16/2021   Left scapholunate ligament tear 10/29/2020   Proctitis 10/17/2020   Hypomagnesemia 10/17/2020   Essential hypertension 10/17/2020   Intermittent diarrhea 10/17/2020   Anemia 06/22/2020   AKI (acute kidney injury) (HCC) 06/22/2020   Bradycardia 06/22/2020   Hypokalemia 06/22/2020   Leukocytosis (leucocytosis) 06/22/2020   Limited mobility 06/22/2020   Knee pain 06/22/2020   HTN  (hypertension) 06/11/2020   Gouty arthritis 06/11/2020   Acute lower UTI 06/11/2020   Weakness 06/11/2020   Recurrent UTI 06/11/2020   DM2 (diabetes mellitus, type 2) (HCC) 06/11/2020   HLD (hyperlipidemia) 06/11/2020   Acute prerenal azotemia 06/11/2020   Epididymo-orchitis, acute 05/07/2012   Scrotal abscess 05/07/2012   Home Medication(s) Prior to Admission medications   Medication Sig Start Date End Date Taking? Authorizing Provider  albuterol (PROVENTIL) (2.5 MG/3ML) 0.083% nebulizer solution Take 3 mLs (2.5 mg total) by nebulization every 6 (six) hours as needed for wheezing or shortness of breath. 12/20/21   Albertine Grates, MD  albuterol (VENTOLIN HFA) 108 (90 Base) MCG/ACT inhaler Inhale 2 puffs into the lungs every 6 (six) hours as needed for wheezing or shortness of breath. 10/14/22   Henson, Vickie L, NP-C  allopurinol (ZYLOPRIM) 300 MG tablet TAKE 1 TABLET BY MOUTH EVERY DAY 10/27/22   Henson, Vickie L, NP-C  amLODipine (NORVASC) 5 MG tablet TAKE 1 TABLET (5 MG TOTAL) BY MOUTH DAILY. 12/15/22   Henson, Vickie L, NP-C  lisinopril (ZESTRIL) 30 MG tablet Take 1 tablet (30 mg total) by mouth daily. 10/09/22   Henson, Vickie L, NP-C  metoprolol succinate (TOPROL-XL) 100 MG 24 hr tablet TAKE 1 TABLET BY MOUTH EVERY DAY 12/15/22   Henson, Vickie L, NP-C  naproxen (NAPROSYN) 375 MG tablet TAKE 1 TABLET BY MOUTH TWICE A DAY AS NEEDED FOR JOINT PAIN 10/14/22   Henson, Vickie L, NP-C  omeprazole (PRILOSEC) 20 MG  capsule TAKE 1 CAPSULE BY MOUTH TWICE A DAY 09/15/22   Henson, Vickie L, NP-C  polyethylene glycol (MIRALAX / GLYCOLAX) 17 g packet Take 17 g by mouth 2 (two) times daily. 11/07/20   Medina-Vargas, Monina C, NP  rosuvastatin (CRESTOR) 5 MG tablet TAKE 1 TABLET BY MOUTH EVERY DAY IN THE EVENING 12/15/22   Henson, Vickie L, NP-C  sertraline (ZOLOFT) 50 MG tablet TAKE 1 TABLET BY MOUTH EVERY DAY 12/08/22   Henson, Vickie L, NP-C  tamsulosin (FLOMAX) 0.4 MG CAPS capsule Take 1 capsule (0.4 mg total) by  mouth daily. 12/20/21   Albertine Grates, MD                                                                                                                                    Allergies Patient has no known allergies.  Review of Systems Review of Systems  Constitutional:  Negative for chills, diaphoresis and fever.  Gastrointestinal:  Positive for diarrhea. Negative for abdominal pain and vomiting.   As noted in HPI  Physical Exam Vital Signs  I have reviewed the triage vital signs BP 132/68   Pulse 66   Temp 98.4 F (36.9 C)   Resp 15   Ht 5\' 6"  (1.676 m)   Wt 68.5 kg   SpO2 99%   BMI 24.37 kg/m   Physical Exam Vitals reviewed.  Constitutional:      General: He is not in acute distress.    Appearance: He is well-developed. He is not diaphoretic.  HENT:     Head: Normocephalic and atraumatic.     Right Ear: External ear normal.     Left Ear: External ear normal.     Nose: Nose normal.     Mouth/Throat:     Mouth: Mucous membranes are moist.  Eyes:     General: No scleral icterus.    Conjunctiva/sclera: Conjunctivae normal.  Neck:     Trachea: Phonation normal.  Cardiovascular:     Rate and Rhythm: Normal rate and regular rhythm.  Pulmonary:     Effort: Pulmonary effort is normal. No respiratory distress.     Breath sounds: No stridor.  Abdominal:     General: There is no distension.     Tenderness: There is no abdominal tenderness.  Genitourinary:    Rectum: External hemorrhoid present.     Comments: Brown stool on DRE Musculoskeletal:        General: Normal range of motion.     Cervical back: Normal range of motion.  Neurological:     Mental Status: He is alert and oriented to person, place, and time.  Psychiatric:        Behavior: Behavior normal.     ED Results and Treatments Labs (all labs ordered are listed, but only abnormal results are displayed) Labs Reviewed  COMPREHENSIVE METABOLIC PANEL - Abnormal; Notable for the following components:  Result Value   CO2 21 (*)    BUN 30 (*)    Calcium 8.6 (*)    AST 8 (*)    All other components within normal limits  CBC WITH DIFFERENTIAL/PLATELET - Abnormal; Notable for the following components:   WBC 11.0 (*)    RBC 3.73 (*)    Hemoglobin 11.9 (*)    HCT 36.7 (*)    Eosinophils Absolute 0.7 (*)    Abs Immature Granulocytes 0.09 (*)    All other components within normal limits  PROTIME-INR  POC OCCULT BLOOD, ED  TYPE AND SCREEN                                                                                                                         EKG  EKG Interpretation  Date/Time:    Ventricular Rate:    PR Interval:    QRS Duration:   QT Interval:    QTC Calculation:   R Axis:     Text Interpretation:         Radiology DG ABD ACUTE 2+V W 1V CHEST  Result Date: 01/05/2023 CLINICAL DATA:  [address] with diarrhea for 3 days and episodes of nausea with increased weakness. EXAM: DG ABDOMEN ACUTE WITH 1 VIEW CHEST COMPARISON:  Portable chest 12/19/2021 FINDINGS: Heart size and vasculature are normal. The lungs are clear. There is aortic atherosclerosis, with unchanged mediastinal configuration. No pleural effusion is evident. There is bilateral acromiohumeral abutment consistent with chronic rotator cuff arthropathy. Comparison to the prior study reveals no significant interval change. IMPRESSION: No active disease. Aortic atherosclerosis. Bilateral acromiohumeral abutment consistent with chronic rotator cuff arthropathy. Electronically Signed   By: Almira Bar M.D.   On: 01/05/2023 23:45    Medications Ordered in ED Medications  0.9 %  sodium chloride infusion ( Intravenous New Bag/Given 01/05/23 2350)  pantoprazole (PROTONIX) injection 40 mg (40 mg Intravenous Given 01/05/23 2343)   Procedures Procedures  (including critical care time) Medical Decision Making / ED Course   Medical Decision Making Amount and/or Complexity of Data Reviewed Labs: ordered. Decision-making  details documented in ED Course. Radiology: ordered and independent interpretation performed. Decision-making details documented in ED Course.  Risk Prescription drug management. Decision regarding hospitalization.    Patient presents for several days of diarrhea.  Possible melena.  Exam with benign abdomen.  Brown stool on DRE.  Will get Hemoccult, assess for anemia.   Patient's daughter arrived and revealed that the patient was taking Kaopectate (bismuth).  Hemoccult was negative.  CBC with mild leukocytosis.  Mild anemia with stable hemoglobin when compared to his priors.  BUN is elevated but stable at his baseline.  No renal insufficiency.  Acute abdominal series obtained to assess for constipation that would reveal impaction was negative.  Black bowel movements that they noted were likely due to the Kaopectate. Patient has not been on any antibiotics recently that would favor C. difficile.  Possibly functional diarrhea versus his gastroenteritis.  I have low suspicion for serious intra-abdominal inflammatory/infectious process requiring imaging at this time.  Patient remained hemodynamically stable throughout his stay.  I do not believe the patient requires admission at this time.  Recommended he discontinue the use of Kaopectate and use Imodium. PCP/GI follow-up    Final Clinical Impression(s) / ED Diagnoses Final diagnoses:  Diarrhea, unspecified type   The patient appears reasonably screened and/or stabilized for discharge and I doubt any other medical condition or other [facility] requiring further screening, evaluation, or treatment in the ED at this time. I have discussed the findings, Dx and Tx plan with the patient/family who expressed understanding and agree(s) with the plan. Discharge instructions discussed at length. The patient/family was given strict return precautions who verbalized understanding of the instructions. No further questions at time of  discharge.  Disposition: Discharge  Condition: Good  ED Discharge Orders     None        Follow Up: Avanell Shackleton, NP-C [address] [place] [address] [phone]  Call  to schedule an appointment for close follow up  Gastroenterology, Debor[place] Sprang [address] [place] [address] [phone]  Call  as needed     This chart was dictated using voice recognition software.  Despite best efforts to proofread,  errors can occur which can change the documentation meaning.    Nira Conn, MD 01/06/23 [phone]

## 2023-01-05 NOTE — ED Notes (Signed)
Pt Hemocult came back negative. Manual system for CBG machine is not working

## 2023-01-05 NOTE — ED Triage Notes (Signed)
Pt arriving via EMS from home for diarrhea x3 days. Pt also having episodes of nausea. Increased weakness. Family reports stool is dark and runny. Pt has had C diff in the past. Paralyzed from the waist down.

## 2023-01-06 DIAGNOSIS — Z743 Need for continuous supervision: Secondary | ICD-10-CM | POA: Diagnosis not present

## 2023-01-06 DIAGNOSIS — R531 Weakness: Secondary | ICD-10-CM | POA: Diagnosis not present

## 2023-01-06 DIAGNOSIS — Z7401 Bed confinement status: Secondary | ICD-10-CM | POA: Diagnosis not present

## 2023-01-06 DIAGNOSIS — R197 Diarrhea, unspecified: Secondary | ICD-10-CM | POA: Diagnosis not present

## 2023-01-06 LAB — COMPREHENSIVE METABOLIC PANEL
ALT: 20 U/L (ref 0–44)
AST: 8 U/L — ABNORMAL LOW (ref 15–41)
Albumin: 3.6 g/dL (ref 3.5–5.0)
Alkaline Phosphatase: 105 U/L (ref 38–126)
Anion gap: 9 (ref 5–15)
BUN: 30 mg/dL — ABNORMAL HIGH (ref 8–23)
CO2: 21 mmol/L — ABNORMAL LOW (ref 22–32)
Calcium: 8.6 mg/dL — ABNORMAL LOW (ref 8.9–10.3)
Chloride: 108 mmol/L (ref 98–111)
Creatinine, Ser: 1.19 mg/dL (ref 0.61–1.24)
GFR, Estimated: >60 mL/min (ref >60)
Glucose, Bld: 98 mg/dL (ref 70–99)
Potassium: 3.8 mmol/L (ref 3.5–5.1)
Sodium: 138 mmol/L (ref 135–145)
Total Bilirubin: 0.7 mg/dL (ref 0.3–1.2)
Total Protein: 7 g/dL (ref 6.5–8.1)

## 2023-01-06 LAB — CBC WITH DIFFERENTIAL/PLATELET
Abs Immature Granulocytes: 0.09 10*3/uL — ABNORMAL HIGH (ref 0.00–0.07)
Basophils Absolute: 0.1 10*3/uL (ref 0.0–0.1)
Basophils Relative: 1 %
Eosinophils Absolute: 0.7 10*3/uL — ABNORMAL HIGH (ref 0.0–0.5)
Eosinophils Relative: 6 %
HCT: 36.7 % — ABNORMAL LOW (ref 39.0–52.0)
Hemoglobin: 11.9 g/dL — ABNORMAL LOW (ref 13.0–17.0)
Immature Granulocytes: 1 %
Lymphocytes Relative: 20 %
Lymphs Abs: 2.2 10*3/uL (ref 0.7–4.0)
MCH: 31.9 pg (ref 26.0–34.0)
MCHC: 32.4 g/dL (ref 30.0–36.0)
MCV: 98.4 fL (ref 80.0–100.0)
Monocytes Absolute: 0.8 10*3/uL (ref 0.1–1.0)
Monocytes Relative: 7 %
Neutro Abs: 7.2 10*3/uL (ref 1.7–7.7)
Neutrophils Relative %: 65 %
Platelets: 233 10*3/uL (ref 150–400)
RBC: 3.73 MIL/uL — ABNORMAL LOW (ref 4.22–5.81)
RDW: 15 % (ref 11.5–15.5)
WBC: 11 10*3/uL — ABNORMAL HIGH (ref 4.0–10.5)
nRBC: 0 % (ref 0.0–0.2)

## 2023-01-06 LAB — PROTIME-INR
INR: 1 (ref 0.8–1.2)
Prothrombin Time: 13.4 seconds (ref 11.4–15.2)

## 2023-01-06 LAB — TYPE AND SCREEN
ABO/RH(D): O POS
Antibody Screen: NEGATIVE

## 2023-01-06 NOTE — ED Notes (Signed)
[facility] called for pt. Transportation back home. Pt. Is 4th on the list.

## 2023-01-07 ENCOUNTER — Telehealth: Payer: Self-pay

## 2023-01-09 NOTE — Transitions of Care (Post Inpatient/ED Visit) (Unsigned)
   01/09/2023  Name: Eduardo Moon MRN: [id number] DOB: [birth year]  Today's TOC FU Call Status: Today's TOC FU Call Status:: Unsuccessful Call (3rd Attempt) Unsuccessful Call (1st Attempt) Date: 01/07/23 Unsuccessful Call (2nd Attempt) Date: 01/08/23 Unsuccessful Call (3rd Attempt) Date: 01/09/23  Attempted to reach the patient regarding the most recent Inpatient/ED visit.  Follow Up Plan: No further outreach attempts will be made at this time. We have been unable to contact the patient.  Signature  Fredirick Maudlin

## 2023-01-25 ENCOUNTER — Other Ambulatory Visit: Payer: Self-pay | Admitting: Family Medicine

## 2023-01-25 DIAGNOSIS — M109 Gout, unspecified: Secondary | ICD-10-CM

## 2023-01-31 ENCOUNTER — Other Ambulatory Visit: Payer: Self-pay | Admitting: Family Medicine

## 2023-03-07 ENCOUNTER — Other Ambulatory Visit: Payer: Self-pay | Admitting: Family Medicine

## 2023-03-17 ENCOUNTER — Ambulatory Visit: Payer: 59 | Admitting: Family Medicine

## 2023-03-29 ENCOUNTER — Other Ambulatory Visit: Payer: Self-pay | Admitting: Family Medicine

## 2023-04-03 ENCOUNTER — Ambulatory Visit: Payer: 59 | Admitting: Family Medicine

## 2023-04-06 ENCOUNTER — Other Ambulatory Visit: Payer: Self-pay | Admitting: Family Medicine

## 2023-04-08 ENCOUNTER — Ambulatory Visit (INDEPENDENT_AMBULATORY_CARE_PROVIDER_SITE_OTHER): Payer: 59 | Admitting: Family Medicine

## 2023-04-08 ENCOUNTER — Encounter: Payer: Self-pay | Admitting: Family Medicine

## 2023-04-08 VITALS — BP 110/60 | HR 88 | Temp 99.0°F

## 2023-04-08 DIAGNOSIS — I493 Ventricular premature depolarization: Secondary | ICD-10-CM

## 2023-04-08 DIAGNOSIS — N390 Urinary tract infection, site not specified: Secondary | ICD-10-CM | POA: Diagnosis not present

## 2023-04-08 DIAGNOSIS — Z1159 Encounter for screening for other viral diseases: Secondary | ICD-10-CM | POA: Diagnosis not present

## 2023-04-08 DIAGNOSIS — E559 Vitamin D deficiency, unspecified: Secondary | ICD-10-CM

## 2023-04-08 DIAGNOSIS — R29898 Other symptoms and signs involving the musculoskeletal system: Secondary | ICD-10-CM

## 2023-04-08 DIAGNOSIS — E785 Hyperlipidemia, unspecified: Secondary | ICD-10-CM | POA: Diagnosis not present

## 2023-04-08 DIAGNOSIS — R001 Bradycardia, unspecified: Secondary | ICD-10-CM | POA: Diagnosis not present

## 2023-04-08 DIAGNOSIS — M25569 Pain in unspecified knee: Secondary | ICD-10-CM

## 2023-04-08 DIAGNOSIS — G8929 Other chronic pain: Secondary | ICD-10-CM

## 2023-04-08 DIAGNOSIS — Z7409 Other reduced mobility: Secondary | ICD-10-CM | POA: Diagnosis not present

## 2023-04-08 DIAGNOSIS — I10 Essential (primary) hypertension: Secondary | ICD-10-CM | POA: Diagnosis not present

## 2023-04-08 DIAGNOSIS — Z8639 Personal history of other endocrine, nutritional and metabolic disease: Secondary | ICD-10-CM

## 2023-04-08 DIAGNOSIS — Z2821 Immunization not carried out because of patient refusal: Secondary | ICD-10-CM

## 2023-04-08 DIAGNOSIS — Z8612 Personal history of poliomyelitis: Secondary | ICD-10-CM

## 2023-04-08 DIAGNOSIS — R600 Localized edema: Secondary | ICD-10-CM

## 2023-04-08 LAB — COMPREHENSIVE METABOLIC PANEL
ALT: 17 U/L (ref 0–53)
AST: 8 U/L (ref 0–37)
Albumin: 3.5 g/dL (ref 3.5–5.2)
Alkaline Phosphatase: 99 U/L (ref 39–117)
BUN: 35 mg/dL — ABNORMAL HIGH (ref 6–23)
CO2: 23 meq/L (ref 19–32)
Calcium: 8.8 mg/dL (ref 8.4–10.5)
Chloride: 106 meq/L (ref 96–112)
Creatinine, Ser: 1.73 mg/dL — ABNORMAL HIGH (ref 0.40–1.50)
GFR: 37.13 mL/min — ABNORMAL LOW (ref >60.00)
Glucose, Bld: 101 mg/dL — ABNORMAL HIGH (ref 70–99)
Potassium: 4.1 meq/L (ref 3.5–5.1)
Sodium: 137 meq/L (ref 135–145)
Total Bilirubin: 0.4 mg/dL (ref 0.2–1.2)
Total Protein: 6.4 g/dL (ref 6.0–8.3)

## 2023-04-08 LAB — CBC WITH DIFFERENTIAL/PLATELET
Basophils Absolute: 0.1 10*3/uL (ref 0.0–0.1)
Basophils Relative: 0.9 % (ref 0.0–3.0)
Eosinophils Absolute: 0.6 10*3/uL (ref 0.0–0.7)
Eosinophils Relative: 6.3 % — ABNORMAL HIGH (ref 0.0–5.0)
HCT: 37.4 % — ABNORMAL LOW (ref 39.0–52.0)
Hemoglobin: 11.9 g/dL — ABNORMAL LOW (ref 13.0–17.0)
Lymphocytes Relative: 19.6 % (ref 12.0–46.0)
Lymphs Abs: 2 10*3/uL (ref 0.7–4.0)
MCHC: 31.7 g/dL (ref 30.0–36.0)
MCV: 99.9 fl (ref 78.0–100.0)
Monocytes Absolute: 0.7 10*3/uL (ref 0.1–1.0)
Monocytes Relative: 7.1 % (ref 3.0–12.0)
Neutro Abs: 6.7 10*3/uL (ref 1.4–7.7)
Neutrophils Relative %: 66.1 % (ref 43.0–77.0)
Platelets: 250 10*3/uL (ref 150.0–400.0)
RBC: 3.74 Mil/uL — ABNORMAL LOW (ref 4.22–5.81)
RDW: 16 % — ABNORMAL HIGH (ref 11.5–15.5)
WBC: 10.2 10*3/uL (ref 4.0–10.5)

## 2023-04-08 LAB — HEMOGLOBIN A1C: Hgb A1c MFr Bld: 5.6 % (ref 4.6–6.5)

## 2023-04-08 LAB — TSH: TSH: 7.29 u[IU]/mL — ABNORMAL HIGH (ref 0.35–5.50)

## 2023-04-08 MED ORDER — METOPROLOL SUCCINATE ER 50 MG PO TB24: 50.0000 mg | ORAL_TABLET | Freq: Every day | ORAL | 0 refills | Status: DC

## 2023-04-08 MED ORDER — METOPROLOL SUCCINATE ER 100 MG PO TB24
100.0000 mg | ORAL_TABLET | Freq: Every day | ORAL | 0 refills | Status: DC
Start: 2023-04-08 — End: 2023-09-28

## 2023-04-08 NOTE — Progress Notes (Signed)
Subjective:     Patient ID: Eduardo Moon, male    DOB: June 23, 1946, 77 y.o.   MRN: [id number]  Chief Complaint  Patient presents with   Follow-up    24month follow up    HPI  Discussed the use of AI scribe software for clinical note transcription with the patient, who gave verbal consent to proceed.  History of Present Illness         Here to follow up on chronic health conditions.   HTN- amlodipine 5 mg daily, lisinopril 30 mg daily, metoprolol XL 100 mg.   Hx of polio at age 73 and does not have feeling in   States he would like to see an orthopedist  Last orthopedist at [facility]   Right knee pain. Bilateral leg weakness.   Taking allopurinol 300 mg daily now. He was taking it bid.   Last gout attack was 3 years ago. It was severe.   No eye exam in years   Declines flu and pneumonia vaccines    Health Maintenance Due  Topic Date Due   COVID-19 Vaccine (1) Never done   FOOT EXAM  Never done   OPHTHALMOLOGY EXAM  Never done   Diabetic kidney evaluation - Urine ACR  Never done   DTaP/Tdap/Td (1 - Tdap) Never done   Zoster Vaccines- Shingrix (1 of 2) Never done   INFLUENZA VACCINE  Never done    Past Medical History:  Diagnosis Date   Arthritis    History of prostate cancer s/p radiactive seed implants  03-24-2011   Hypertension    Mild acid reflux watches diet   Polio age 35   Polio    Scrotal abscess    Weakness 05/2020   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   uses w/c for distance    Past Surgical History:  Procedure Laterality Date   ANKLE RECONSTRUCTION  1957   BILATERAL   ORCHIECTOMY  05/07/2012   Procedure: ORCHIECTOMY;  Surgeon: Valetta Fuller, MD;  Location: [facility];  Service: Urology;  Laterality: Right;   RADIOACTIVE PROSTATE SEED IMPLANTS  04-21-2011    DR Isabel Caprice   PROSTATE CANCER   SCROTAL EXPLORATION  05/07/2012   Procedure: SCROTUM EXPLORATION;  Surgeon: Valetta Fuller, MD;  Location: [facility];  Service:  Urology;  Laterality: Right;  DRAINAGE OF ABSCESS  1 HR  UHC MEDICARE    Family History  Problem Relation Age of Onset   Cancer Mother    Cancer Father    Cancer Brother     Social History   Socioeconomic History   Marital status: Widowed    Spouse name: Not on file   Number of children: Not on file   Years of education: Not on file   Highest education level: Not on file  Occupational History   Not on file  Tobacco Use   Smoking status: Never   Smokeless tobacco: Never  Vaping Use   Vaping status: Never Used  Substance and Sexual Activity   Alcohol use: No   Drug use: No   Sexual activity: Not Currently  Other Topics Concern   Not on file  Social History Narrative   Not on file   Social Determinants of Health   Financial Resource Strain: Low Risk  (07/01/2022)   Overall Financial Resource Strain (CARDIA)    Difficulty of Paying Living Expenses: Not hard at all  Food Insecurity: No Food Insecurity (07/01/2022)   Hunger Vital Sign  Worried About Programme researcher, broadcasting/film/video in the Last Year: Never true    Ran Out of Food in the Last Year: Never true  Transportation Needs: No Transportation Needs (07/01/2022)   PRAPARE - Administrator, Civil Service (Medical): No    Lack of Transportation (Non-Medical): No  Physical Activity: Inactive (07/01/2022)   Exercise Vital Sign    Days of Exercise per Week: 0 days    Minutes of Exercise per Session: 0 min  Stress: No Stress Concern Present (07/01/2022)   [employer] of Occupational Health - Occupational Stress Questionnaire    Feeling of Stress : Not at all  Social Connections: Moderately Isolated (07/01/2022)   Social Connection and Isolation Panel [NHANES]    Frequency of Communication with Friends and Family: More than three times a week    Frequency of Social Gatherings with Friends and Family: Three times a week    Attends Religious Services: 1 to 4 times per year    Active Member of Clubs or  Organizations: No    Attends Banker Meetings: Never    Marital Status: Widowed  Intimate Partner Violence: Not At Risk (07/01/2022)   Humiliation, Afraid, Rape, and Kick questionnaire    Fear of Current or Ex-Partner: No    Emotionally Abused: No    Physically Abused: No    Sexually Abused: No    Outpatient Medications Prior to Visit  Medication Sig Dispense Refill   albuterol (PROVENTIL) (2.5 MG/3ML) 0.083% nebulizer solution Take 3 mLs (2.5 mg total) by nebulization every 6 (six) hours as needed for wheezing or shortness of breath. 75 mL 1   albuterol (VENTOLIN HFA) 108 (90 Base) MCG/ACT inhaler TAKE 2 PUFFS BY MOUTH EVERY 6 HOURS AS NEEDED FOR WHEEZE OR SHORTNESS OF BREATH 8.5 each 1   allopurinol (ZYLOPRIM) 300 MG tablet TAKE 1 TABLET BY MOUTH EVERY DAY 90 tablet 0   amLODipine (NORVASC) 5 MG tablet TAKE 1 TABLET (5 MG TOTAL) BY MOUTH DAILY. 90 tablet 1   lisinopril (ZESTRIL) 30 MG tablet TAKE 1 TABLET BY MOUTH EVERY DAY 90 tablet 1   naproxen (NAPROSYN) 375 MG tablet TAKE 1 TABLET BY MOUTH TWICE A DAY AS NEEDED FOR JOINT PAIN 60 tablet 2   omeprazole (PRILOSEC) 20 MG capsule TAKE 1 CAPSULE BY MOUTH TWICE A DAY 180 capsule 1   polyethylene glycol (MIRALAX / GLYCOLAX) 17 g packet Take 17 g by mouth 2 (two) times daily. 14 each 3   rosuvastatin (CRESTOR) 5 MG tablet TAKE 1 TABLET BY MOUTH EVERY DAY IN THE EVENING 90 tablet 1   sertraline (ZOLOFT) 50 MG tablet TAKE 1 TABLET BY MOUTH EVERY DAY 90 tablet 1   tamsulosin (FLOMAX) 0.4 MG CAPS capsule Take 1 capsule (0.4 mg total) by mouth daily. 30 capsule 0   metoprolol succinate (TOPROL-XL) 100 MG 24 hr tablet TAKE 1 TABLET BY MOUTH EVERY DAY 90 tablet 1   No facility-administered medications prior to visit.    No Known Allergies  Review of Systems  Constitutional:  Negative for chills and fever.  Respiratory:  Negative for cough and shortness of breath.   Cardiovascular:  Negative for chest pain, palpitations and leg  swelling.  Gastrointestinal:  Negative for abdominal pain, constipation, diarrhea, nausea and vomiting.  Genitourinary:  Negative for dysuria, frequency and urgency.  Musculoskeletal:  Positive for joint pain and myalgias.  Neurological:  Positive for weakness. Negative for dizziness, speech change and headaches.  Objective:    Physical Exam Constitutional:      General: He is not in acute distress.    Appearance: He is not ill-appearing.  Eyes:     Extraocular Movements: Extraocular movements intact.     Conjunctiva/sclera: Conjunctivae normal.  Cardiovascular:     Rate and Rhythm: Normal rate and regular rhythm.  Pulmonary:     Effort: Pulmonary effort is normal.     Breath sounds: Normal breath sounds.  Musculoskeletal:     Cervical back: Normal range of motion and neck supple.     Left lower leg: Edema present.     Comments: Chronic bilateral leg weakness. Left > right.   Skin:    General: Skin is warm and dry.  Neurological:     General: No focal deficit present.     Mental Status: He is alert and oriented to person, place, and time.     Motor: Weakness present.     Coordination: Coordination abnormal.  Psychiatric:        Mood and Affect: Mood normal.        Behavior: Behavior normal.        Thought Content: Thought content normal.      BP 110/60 (BP Location: Left Arm, Patient Position: Sitting, Cuff Size: Large)   Pulse 88   Temp 99 F (37.2 C) (Oral)   SpO2 98%  Wt Readings from Last 3 Encounters:  01/05/23 151 lb (68.5 kg)  12/16/21 161 lb (73 kg)  11/07/20 153 lb 9.6 oz (69.7 kg)       Assessment & Plan:   Problem List Items Addressed This Visit       Cardiovascular and Mediastinum   Frequent unifocal PVCs    EKG shows SR, rate 88, frequent unifocal PVCs not seen on 2023 EKG.  Hx of prolonged QT not seen today Referral to EP      Relevant Medications   metoprolol succinate (TOPROL-XL) 100 MG 24 hr tablet   Other Relevant Orders   TSH  (Completed)   Ambulatory referral to Cardiac Electrophysiology   EKG 12-Lead (Completed)   HTN (hypertension) - Primary    Controlled. Continue current medication regimen       Relevant Medications   metoprolol succinate (TOPROL-XL) 100 MG 24 hr tablet   Other Relevant Orders   CBC with Differential/Platelet (Completed)   Comprehensive metabolic panel (Completed)   EKG 12-Lead (Completed)     Genitourinary   Recurrent UTI    Asymptomatic. Requests to test urine.  UA ordered but unable to provide urine specimen. Follow up with urologist as needed.       Relevant Orders   Urinalysis, Routine w reflex microscopic     Other   Bilateral leg weakness    Chronic since having polio as child and hx of sepsis and severe gout.  He has done PT in past.        Relevant Orders   Ambulatory referral to Orthopedic Surgery   Bradycardia    Not seen on EKG.       Relevant Orders   EKG 12-Lead (Completed)   History of diabetes mellitus, type II    DM appears to be in remission. Check A1c and follow up.       Relevant Orders   Microalbumin / creatinine urine ratio   Hemoglobin A1c (Completed)   Ambulatory referral to Ophthalmology   History of poliomyelitis   Relevant Orders   Ambulatory referral to Orthopedic Surgery  HLD (hyperlipidemia)    Continue statin.       Relevant Medications   metoprolol succinate (TOPROL-XL) 100 MG 24 hr tablet   Immunization declined   Knee pain    Requests referral to orthopedist. Last seen by ortho at [facility] years ago.       Relevant Orders   Ambulatory referral to Orthopedic Surgery   Leg edema, left   Limited mobility    Transport chair and regular manual wheelchair.       Vitamin D deficiency    Take vitamin D supplement.       Other Visit Diagnoses     Encounter for screening for other viral diseases       Relevant Orders   Hepatitis C antibody (Completed)       I have discontinued Everardo Pacific "Del"'s metoprolol  succinate and metoprolol succinate. I am also having him start on metoprolol succinate. Additionally, I am having him maintain his polyethylene glycol, tamsulosin, albuterol, sertraline, amLODipine, rosuvastatin, omeprazole, allopurinol, naproxen, albuterol, and lisinopril.  Meds ordered this encounter  Medications   DISCONTD: metoprolol succinate (TOPROL-XL) 50 MG 24 hr tablet    Sig: Take 1 tablet (50 mg total) by mouth daily. Take with or immediately following a meal.    Dispense:  90 tablet    Refill:  0    Order Specific Question:   Supervising Provider    Answer:   Hillard Danker A [[id number]]   metoprolol succinate (TOPROL-XL) 100 MG 24 hr tablet    Sig: Take 1 tablet (100 mg total) by mouth daily. Take with or immediately following a meal.    Dispense:  90 tablet    Refill:  0    Order Specific Question:   Supervising Provider    Answer:   Hillard Danker A [[id number]]

## 2023-04-08 NOTE — Patient Instructions (Addendum)
Continue your current medications.   I have referred you to a heart doctor to evaluate for the abnormal heart beats.   I referred you to an orthopedist and eye doctor and they will call you to schedule.

## 2023-04-09 LAB — HEPATITIS C ANTIBODY: Hepatitis C Ab: NONREACTIVE

## 2023-04-10 ENCOUNTER — Telehealth: Payer: Self-pay | Admitting: Family Medicine

## 2023-04-10 NOTE — Telephone Encounter (Signed)
Patient's daughter called and said she is concerned about patient's recent labs. She would like a call back at [phone].

## 2023-04-13 ENCOUNTER — Telehealth: Payer: Self-pay | Admitting: Family Medicine

## 2023-04-13 DIAGNOSIS — I493 Ventricular premature depolarization: Secondary | ICD-10-CM | POA: Insufficient documentation

## 2023-04-13 NOTE — Telephone Encounter (Signed)
Called and advised pt daughter lab results have not been resulted yet.

## 2023-04-13 NOTE — Assessment & Plan Note (Signed)
Take vitamin D supplement

## 2023-04-13 NOTE — Assessment & Plan Note (Signed)
Controlled. Continue current medication regimen.

## 2023-04-13 NOTE — Assessment & Plan Note (Signed)
Requests referral to orthopedist. Last seen by ortho at [facility] years ago.

## 2023-04-13 NOTE — Assessment & Plan Note (Addendum)
EKG shows SR, rate 88, frequent unifocal PVCs not seen on 2023 EKG.  Hx of prolonged QT not seen today Referral to EP

## 2023-04-13 NOTE — Assessment & Plan Note (Signed)
DM appears to be in remission. Check A1c and follow up.

## 2023-04-13 NOTE — Assessment & Plan Note (Addendum)
Asymptomatic. Requests to test urine.  UA ordered but unable to provide urine specimen. Follow up with urologist as needed.

## 2023-04-13 NOTE — Telephone Encounter (Signed)
Called and advised daughter the lab results have not been resulted by provider yet but I will call her once that has been done. Advised provider is out of office today so could be Tuesday/Wednesday.   FYI

## 2023-04-13 NOTE — Assessment & Plan Note (Signed)
Continue statin. 

## 2023-04-13 NOTE — Assessment & Plan Note (Signed)
Not seen on EKG.

## 2023-04-13 NOTE — Assessment & Plan Note (Signed)
Transport chair and regular manual wheelchair.

## 2023-04-13 NOTE — Telephone Encounter (Signed)
Pt daughter wanting to speak with the nurse she was waiting on a returned call from Last Friday.

## 2023-04-13 NOTE — Assessment & Plan Note (Signed)
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  He has done PT in past.

## 2023-04-14 ENCOUNTER — Other Ambulatory Visit: Payer: Self-pay | Admitting: Family Medicine

## 2023-04-14 DIAGNOSIS — N189 Chronic kidney disease, unspecified: Secondary | ICD-10-CM

## 2023-04-14 DIAGNOSIS — R7989 Other specified abnormal findings of blood chemistry: Secondary | ICD-10-CM

## 2023-04-14 NOTE — Progress Notes (Signed)
His TSH (thyroid lab) is slightly elevated. Also, his kidney function is worse and we often see this when patient's are not well hydrated. Since he was not able to provide a urine sample in the office, he was most likely not well hydrated. I would like to recheck these labs later this week or next week but ask him to come in well hydrated. I will also check more thyroid tests. Ok to come in for a lab visit. I can put in orders if he is agreeable.

## 2023-04-14 NOTE — Telephone Encounter (Signed)
Called daughter and relayed lab results and booked lab appt for recheck next week

## 2023-04-17 NOTE — Telephone Encounter (Signed)
Called daughter and lab results were addressed on 9/17 please see other encounter

## 2023-04-24 ENCOUNTER — Other Ambulatory Visit: Payer: 59

## 2023-04-26 ENCOUNTER — Other Ambulatory Visit: Payer: Self-pay | Admitting: Family Medicine

## 2023-04-26 DIAGNOSIS — M109 Gout, unspecified: Secondary | ICD-10-CM

## 2023-04-27 ENCOUNTER — Other Ambulatory Visit (INDEPENDENT_AMBULATORY_CARE_PROVIDER_SITE_OTHER): Payer: 59

## 2023-04-27 DIAGNOSIS — R7989 Other specified abnormal findings of blood chemistry: Secondary | ICD-10-CM | POA: Diagnosis not present

## 2023-04-27 DIAGNOSIS — N189 Chronic kidney disease, unspecified: Secondary | ICD-10-CM

## 2023-04-27 DIAGNOSIS — R946 Abnormal results of thyroid function studies: Secondary | ICD-10-CM

## 2023-04-28 LAB — BASIC METABOLIC PANEL
BUN: 30 mg/dL — ABNORMAL HIGH (ref 6–23)
CO2: 21 meq/L (ref 19–32)
Calcium: 8.5 mg/dL (ref 8.4–10.5)
Chloride: 110 meq/L (ref 96–112)
Creatinine, Ser: 1.31 mg/dL (ref 0.40–1.50)
GFR: 51.81 mL/min — ABNORMAL LOW (ref >60.00)
Glucose, Bld: 107 mg/dL — ABNORMAL HIGH (ref 70–99)
Potassium: 4.1 meq/L (ref 3.5–5.1)
Sodium: 140 meq/L (ref 135–145)

## 2023-04-28 LAB — T4, FREE: Free T4: 0.87 ng/dL (ref 0.60–1.60)

## 2023-04-28 LAB — TSH: TSH: 5.8 u[IU]/mL — ABNORMAL HIGH (ref 0.35–5.50)

## 2023-04-28 LAB — T3, FREE: T3, Free: 2.8 pg/mL (ref 2.3–4.2)

## 2023-04-29 LAB — THYROID ANTIBODIES
Thyroglobulin Ab: <1 [IU]/mL (ref <=1)
Thyroperoxidase Ab SerPl-aCnc: <1 [IU]/mL (ref <9)

## 2023-04-29 NOTE — Progress Notes (Signed)
Please see my note to him and schedule 3 month follow up in office.  I will monitor kidney and thyroid function.

## 2023-05-02 NOTE — Progress Notes (Unsigned)
Electrophysiology Office Note:   Date:  05/04/2023  ID:  Eduardo Moon, DOB April 12, 1946, MRN [id number]  Primary Cardiologist: None Electrophysiologist: Nobie Putnam, MD      History of Present Illness:   Eduardo Moon is a 77 y.o. male with h/o hypertension, gout, hyperlipidemia poliomyelitis (polio infection at age 22) seen today for Electrophysiology evaluation of PVCs at the request of Hetty Blend, NP.    Patient recently had EKG done during a visit to his primary care, where he was found to have unifocal PVCs in a trigeminal pattern.  He was referred to EP for evaluation of this.  Patient denies any known history of PVCs.  He also denies palpitations, chest pain, dizziness or lightheadedness.  Review of systems complete and found to be negative unless listed in HPI.   EP Information / Studies Reviewed:    EKG is ordered today. Personal review as below.  EKG Interpretation Date/Time:  Monday May 04 2023 15:34:37 EDT Ventricular Rate:  75 PR Interval:  194 QRS Duration:  88 QT Interval:  388 QTC Calculation: 433 R Axis:   36  Text Interpretation: Normal sinus rhythm When compared with ECG of 16-Dec-2021 11:35, PVCs are absent. Confirmed by Nobie Putnam [phone]) on 05/04/2023 4:07:49 PM   EKG 04/08/23   Echo 06/22/20:  Normal LV size and function.  LVEF 60 to 65%.  No wall motion abnormalities.  Grade 1 diastolic dysfunction. Normal RV size and function. No significant valvular disease mention.   Physical Exam:   VS:  BP 118/60   Pulse 75   Ht 5\' 7"  (1.702 m)   Wt 190 lb (86.2 kg)   SpO2 99%   BMI 29.76 kg/m    Wt Readings from Last 3 Encounters:  05/04/23 190 lb (86.2 kg)  01/05/23 151 lb (68.5 kg)  12/16/21 161 lb (73 kg)     GEN: Well nourished, well developed in no acute distress NECK: No JVD; No carotid bruits CARDIAC: Regular rate and rhythm RESPIRATORY:  Clear to auscultation without rales, wheezing or rhonchi  ABDOMEN: Soft, non-tender,  non-distended EXTREMITIES:  No edema; No deformity   ASSESSMENT AND PLAN:   Eduardo Moon is a 77 y.o. male with h/o hypertension, gout, hyperlipidemia poliomyelitis (polio infection at age 34) c/b bilateral lower extremity weakness seen today for Electrophysiology evaluation of PVCs at the request of Hetty Blend, NP.   #. Unifocal outflow tract PVC: He is asymptomatic. Last echo with normal EF, but in 05/2020. There were no PVCs on his ECG today. Burden unclear.  - Repeat echocardiogram to reassess LVEF in setting of frequent PVCs. - Zio monitor to assess burden. - Continue metoprolol XL 100mg  per day.  - If his LVEF is reduced, PVC burden is >20% or he has symptom triggered episodes on Zio that are associated with frequent PVCs, then we will uptitrate metoprolol vs start anti-arrhythmic therapy for suppression at next visit.  Follow up with EP APP  6 weeks.   Signed, Nobie Putnam, MD

## 2023-05-04 ENCOUNTER — Ambulatory Visit: Payer: 59 | Attending: Cardiology

## 2023-05-04 ENCOUNTER — Encounter: Payer: Self-pay | Admitting: Cardiology

## 2023-05-04 ENCOUNTER — Ambulatory Visit: Payer: 59 | Attending: Cardiology | Admitting: Cardiology

## 2023-05-04 VITALS — BP 118/60 | HR 75 | Ht 67.0 in | Wt 190.0 lb

## 2023-05-04 DIAGNOSIS — I493 Ventricular premature depolarization: Secondary | ICD-10-CM

## 2023-05-04 NOTE — Patient Instructions (Addendum)
Medication Instructions:  Your physician recommends that you continue on your current medications as directed. Please refer to the Current Medication list given to you today.  *If you need a refill on your cardiac medications before your next appointment, please call your pharmacy*  Testing/Procedures: Your physician has requested that you have an echocardiogram. Echocardiography is a painless test that uses sound waves to create images of your heart. It provides your doctor with information about the size and shape of your heart and how well your heart's chambers and valves are working. This procedure takes approximately one hour. There are no restrictions for this procedure. Please do NOT wear cologne, perfume, aftershave, or lotions (deodorant is allowed). Please arrive 15 minutes prior to your appointment time.  Your physician has recommended that you wear an event monitor. Event monitors are medical devices that record the heart's electrical activity. Doctors most often [place] these monitors to diagnose arrhythmias. Arrhythmias are problems with the speed or rhythm of the heartbeat. The monitor is a small, portable device. You can wear one while you do your normal daily activities. This is usually used to diagnose what is causing palpitations/syncope (passing out).  Follow-Up: At [facility], you and your health needs are our priority.  As part of our continuing mission to provide you with exceptional heart care, we have created designated Provider Care Teams.  These Care Teams include your primary Cardiologist (physician) and Advanced Practice Providers (APPs -  Physician Assistants and Nurse Practitioners) who all work together to provide you with the care you need, when you need it.  Your next appointment:   6-8 weeks   Provider:   You will see one of the following Advanced Practice Providers on your designated Care Team:   Francis Dowse, Charlott Holler "Mardelle Matte" [place], [place] Sherie Don, NP Canary Brim, NP  Other Instructions Eduardo Moon- Long Term Monitor Instructions  Your physician has requested you wear a ZIO patch monitor for 14 days.  This is a single patch monitor. [employer] supplies one patch monitor per enrollment. Additional stickers are not available. Please do not apply patch if you will be having a Nuclear Stress Test,  Echocardiogram, Cardiac CT, MRI, or Chest Xray during the period you would be wearing the  monitor. The patch cannot be worn during these tests. You cannot remove and re-apply the  ZIO XT patch monitor.  Your ZIO patch monitor will be mailed 3 day USPS to your address on file. It may take 3-5 days  to receive your monitor after you have been enrolled.  Once you have received your monitor, please review the enclosed instructions. Your monitor  has already been registered assigning a specific monitor serial # to you.  Billing and Patient Assistance Program Information  We have supplied [employer] with any of your insurance information on file for billing purposes. [employer] offers a sliding scale Patient Assistance Program for patients that do not have  insurance, or whose insurance does not completely cover the cost of the ZIO monitor.  You must apply for the Patient Assistance Program to qualify for this discounted rate.  To apply, please call [employer] at [phone], select option 4, select option 2, ask to apply for  Patient Assistance Program. Meredeth Ide will ask your household income, and how many people  are in your household. They will quote your out-of-pocket cost based on that information.  [employer] will also be able to set up a 24-month, interest-free payment plan if needed.  Applying the  monitor   Shave hair from upper left chest.  Hold abrader disc by orange tab. Rub abrader in 40 strokes over the upper left chest as  indicated in your monitor instructions.  Clean area with 4 enclosed alcohol pads. Let dry.  Apply patch as indicated  in monitor instructions. Patch will be placed under collarbone on left  side of chest with arrow pointing upward.  Rub patch adhesive wings for 2 minutes. Remove white label marked "1". Remove the white  label marked "2". Rub patch adhesive wings for 2 additional minutes.  While looking in a mirror, press and release button in center of patch. A small green light will  flash 3-4 times. This will be your only indicator that the monitor has been turned on.  Do not shower for the first 24 hours. You may shower after the first 24 hours.  Press the button if you feel a symptom. You will hear a small click. Record Date, Time and  Symptom in the Patient Logbook.  When you are ready to remove the patch, follow instructions on the last 2 pages of Patient  Logbook. Stick patch monitor onto the last page of Patient Logbook.  Place Patient Logbook in the blue and white box. Use locking tab on box and tape box closed  securely. The blue and white box has prepaid postage on it. Please place it in the mailbox as  soon as possible. Your physician should have your test results approximately 7 days after the  monitor has been mailed back to [facility].  Call [facility] Customer Care at [phone] if you have questions regarding  your ZIO XT patch monitor. Call them immediately if you see an orange light blinking on your  monitor.  If your monitor falls off in less than 4 days, contact our Monitor department at [phone].  If your monitor becomes loose or falls off after 4 days call [employer] at [phone] for  suggestions on securing your monitor

## 2023-05-04 NOTE — Progress Notes (Unsigned)
Enrolled for [employer] to mail a ZIO XT long term holter monitor to [address] , [place], [place]  [address].  Monitor serial # [id number] never received by patient and was returned to [facility] unused.  ZIO serial # [id number] from office inventory was applied to patient instead.  Eduardo Moon  from [place] notified via email.

## 2023-05-14 ENCOUNTER — Ambulatory Visit (INDEPENDENT_AMBULATORY_CARE_PROVIDER_SITE_OTHER): Payer: 59 | Admitting: Orthopedic Surgery

## 2023-05-14 DIAGNOSIS — M89662 Osteopathy after poliomyelitis, left lower leg: Secondary | ICD-10-CM | POA: Diagnosis not present

## 2023-05-14 DIAGNOSIS — B91 Sequelae of poliomyelitis: Secondary | ICD-10-CM | POA: Diagnosis not present

## 2023-05-14 NOTE — Progress Notes (Signed)
Office Visit Note   Patient: Lenzy Vranich           Date of Birth: 12-Jan-1946           MRN: [id number] Visit Date: 05/14/2023              Requested by: Avanell Shackleton, NP-C [address] [place],  [place] [address] PCP: Avanell Shackleton, NP-C  No chief complaint on file.     HPI: Patient is a 77 year old gentleman who is seen for initial evaluation for bilateral lower extremities.  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  Patient states he has not walked in 4 years secondary to weakness.  Patient states he did physical therapy at home until he maxed out on his physical therapy.  Patient is currently on allopurinol for gout.  Assessment & Plan: Visit Diagnoses: No diagnosis found.  Plan: Patient was provided a brace with [employer] for a knee ankle-foot orthosis.  Follow-Up Instructions: No follow-ups on file.   Ortho Exam  Patient is alert, oriented, no adenopathy, well-dressed, normal affect, normal respiratory effort. Examination patient has dependent swelling of the left lower extremity secondary to lack of motor function.  Patient has no active motor function of the right knee or right ankle.  Radiographs of the left lower extremity shows demineralization.  Patient's last uric acid was 6.7.  Imaging: No results found. No images are attached to the encounter.  Labs: Lab Results  Component Value Date   HGBA1C 5.6 04/08/2023   HGBA1C 5.8 06/23/2022   HGBA1C 5.4 12/16/2021   ESRSEDRATE >140 (H) 06/25/2020   ESRSEDRATE >140 (H) 06/22/2020   LABURIC 6.7 06/23/2022   LABURIC <1.5 (L) 12/20/2021   LABURIC 3.5 (L) 06/24/2020   REPTSTATUS 12/21/2021 FINAL 12/16/2021   GRAMSTAIN  05/07/2012    MODERATE WBC PRESENT,BOTH PMN AND MONONUCLEAR NO SQUAMOUS EPITHELIAL CELLS SEEN NO ORGANISMS SEEN   GRAMSTAIN  05/07/2012    MODERATE WBC PRESENT,BOTH PMN AND MONONUCLEAR NO SQUAMOUS EPITHELIAL CELLS SEEN NO ORGANISMS SEEN   CULT   12/16/2021    NO GROWTH 5 DAYS Performed at [facility], [address] [place], [place] [address]    LABORGA ESCHERICHIA COLI (A) 12/16/2021     Lab Results  Component Value Date   ALBUMIN 3.5 04/08/2023   ALBUMIN 3.6 01/05/2023   ALBUMIN 4.1 06/23/2022    Lab Results  Component Value Date   MG 1.8 12/19/2021   MG 1.8 10/23/2020   MG 2.1 10/21/2020   Lab Results  Component Value Date   VD25OH 58.12 01/24/2022   VD25OH 112.66 (H) 06/22/2020    No results found for: "PREALBUMIN"    Latest Ref Rng & Units 04/08/2023    2:53 PM 01/05/2023   11:44 PM 06/23/2022   10:51 AM  CBC EXTENDED  WBC 4.0 - 10.5 K/uL 10.2  11.0  10.6   RBC 4.22 - 5.81 Mil/uL 3.74  3.73  4.34   Hemoglobin 13.0 - 17.0 g/dL 52.8  41.3  24.4   HCT 39.0 - 52.0 % 37.4  36.7  42.0   Platelets 150.0 - 400.0 K/uL 250.0  233  252.0   NEUT# 1.4 - 7.7 K/uL 6.7  7.2  7.4   Lymph# 0.7 - 4.0 K/uL 2.0  2.2  2.0      There is no height or weight on file to calculate BMI.  Orders:  No orders of the  defined types were placed in this encounter.  No orders of the defined types were placed in this encounter.    Procedures: No procedures performed  Clinical Data: No additional findings.  ROS:  All other systems negative, except as noted in the HPI. Review of Systems  Objective: Vital Signs: There were no vitals taken for this visit.  Specialty Comments:  No specialty comments available.  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   Leg edema, left 04/08/2023   Immunization declined 04/08/2023   History of poliomyelitis 09/23/2022   Bilateral leg weakness 09/23/2022   History of gout 06/23/2022   Medication management 06/23/2022   History of diabetes mellitus, type II 06/23/2022   Iron deficiency anemia 01/24/2022   History of prostate cancer 01/24/2022   Anxiety  01/24/2022   Hx of sepsis 01/24/2022   Vitamin D deficiency 01/24/2022   Severe sepsis (HCC) 12/16/2021   Sepsis secondary to UTI (HCC) 12/16/2021   Acute metabolic encephalopathy 12/16/2021   Left scapholunate ligament tear 10/29/2020   Proctitis 10/17/2020   Hypomagnesemia 10/17/2020   Essential hypertension 10/17/2020   Intermittent diarrhea 10/17/2020   Anemia 06/22/2020   AKI (acute kidney injury) (HCC) 06/22/2020   Bradycardia 06/22/2020   Hypokalemia 06/22/2020   Leukocytosis (leucocytosis) 06/22/2020   Limited mobility 06/22/2020   Knee pain 06/22/2020   HTN (hypertension) 06/11/2020   Gouty arthritis 06/11/2020   Acute lower UTI 06/11/2020   Weakness 06/11/2020   Recurrent UTI 06/11/2020   DM2 (diabetes mellitus, type 2) (HCC) 06/11/2020   HLD (hyperlipidemia) 06/11/2020   Acute prerenal azotemia 06/11/2020   Epididymo-orchitis, acute 05/07/2012   Scrotal abscess 05/07/2012   Past Medical History:  Diagnosis Date   Arthritis    History of prostate cancer s/p radiactive seed implants  03-24-2011   Hypertension    Mild acid reflux watches diet   Polio age 61   Polio    Scrotal abscess    Weakness 05/2020   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   uses w/c for distance    Family History  Problem Relation Age of Onset   Cancer Mother    Cancer Father    Cancer Brother     Past Surgical History:  Procedure Laterality Date   ANKLE RECONSTRUCTION  1957   BILATERAL   ORCHIECTOMY  05/07/2012   Procedure: ORCHIECTOMY;  Surgeon: Valetta Fuller, MD;  Location: [facility];  Service: Urology;  Laterality: Right;   RADIOACTIVE PROSTATE SEED IMPLANTS  04-21-2011    DR Isabel Caprice   PROSTATE CANCER   SCROTAL EXPLORATION  05/07/2012   Procedure: SCROTUM EXPLORATION;  Surgeon: Valetta Fuller, MD;  Location: [facility];  Service: Urology;  Laterality: Right;  DRAINAGE OF ABSCESS  1 HR  UHC MEDICARE   Social History   Occupational History    Not on file  Tobacco Use   Smoking status: Never   Smokeless tobacco: Never  Vaping Use   Vaping status: Never Used  Substance and Sexual Activity   Alcohol use: No   Drug use: No   Sexual activity: Not Currently

## 2023-05-18 ENCOUNTER — Encounter: Payer: Self-pay | Admitting: Orthopedic Surgery

## 2023-05-26 ENCOUNTER — Ambulatory Visit (HOSPITAL_COMMUNITY): Payer: 59

## 2023-05-29 ENCOUNTER — Other Ambulatory Visit: Payer: Self-pay | Admitting: Family Medicine

## 2023-06-09 ENCOUNTER — Other Ambulatory Visit (HOSPITAL_COMMUNITY): Payer: 59

## 2023-06-11 DIAGNOSIS — N3021 Other chronic cystitis with hematuria: Secondary | ICD-10-CM | POA: Diagnosis not present

## 2023-06-16 ENCOUNTER — Ambulatory Visit (HOSPITAL_COMMUNITY): Payer: 59 | Attending: Cardiology

## 2023-06-16 DIAGNOSIS — E785 Hyperlipidemia, unspecified: Secondary | ICD-10-CM | POA: Diagnosis not present

## 2023-06-16 DIAGNOSIS — E119 Type 2 diabetes mellitus without complications: Secondary | ICD-10-CM | POA: Insufficient documentation

## 2023-06-16 DIAGNOSIS — I10 Essential (primary) hypertension: Secondary | ICD-10-CM | POA: Diagnosis not present

## 2023-06-16 DIAGNOSIS — R001 Bradycardia, unspecified: Secondary | ICD-10-CM | POA: Insufficient documentation

## 2023-06-16 DIAGNOSIS — R609 Edema, unspecified: Secondary | ICD-10-CM | POA: Diagnosis not present

## 2023-06-16 DIAGNOSIS — I493 Ventricular premature depolarization: Secondary | ICD-10-CM | POA: Insufficient documentation

## 2023-06-16 LAB — ECHOCARDIOGRAM COMPLETE
Calc EF: 58.4 %
S' Lateral: 2.78 cm
Single Plane A2C EF: 57.8 %
Single Plane A4C EF: 58.3 %

## 2023-06-16 NOTE — Progress Notes (Unsigned)
  Electrophysiology Office Note:   Date:  06/17/2023  ID:  Jeffrie Dicola, DOB Jan 05, 1946, MRN [id number]  Primary Cardiologist: None Electrophysiologist: Nobie Putnam, MD      History of Present Illness:   Eduardo Moon is a 77 y.o. male with h/o unifocal PVC's, HTN, hyperlipidemia poliomyelitis (polio infection at age 17), gout, DM II, CKD seen today for routine electrophysiology followup.   Seen by Dr. Jimmey Ralph on 05/04/23 for unifocal PVC's in a trigeminal pattern. PVC's were thought to be an outflow tract PVC.  He was planned for Zio to quantify PVC burden and ECHO.   Since last being seen in our clinic the patient reports he is doing well overall. He is unaware of his PVC's.  Notes BLE swelling after being up in his chair all day that goes away after sleeping with legs elevated. He lives in an apartment and unfortunately did not receive his ZIO.    He denies chest pain, palpitations, dyspnea, PND, orthopnea, nausea, vomiting, dizziness, syncope, edema, weight gain, or early satiety.   Review of systems complete and found to be negative unless listed in HPI.   EP Information / Studies Reviewed:    EKG is not ordered today. EKG from 05/04/23 reviewed which showed NSR 75 bpm      Studies:  ECHO 05/2020 > LVEF 60-65%, normal LV size / function, G1DD, normal RV size / function, no significant valvular disease  ECHO 06/17/23 > LVEF 60-65%, no RWMA, RV systolic function normal, normal PASP, LA mild-mod dilated, trivial mitral valve regurgitation, no MV stenosis, AV normal ZIO 05/04/23 >    Arrhythmia / AAD PVC's        Physical Exam:   VS:  BP 130/72   Pulse 86   Ht 5\' 10"  (1.778 m)   Wt 215 lb (97.5 kg) Comment: per pt. wheelchair," unable to stand"  SpO2 96%   BMI 30.85 kg/m    Wt Readings from Last 3 Encounters:  06/17/23 215 lb (97.5 kg)  05/04/23 190 lb (86.2 kg)  01/05/23 151 lb (68.5 kg)     GEN: Well nourished, well developed in no acute distress NECK: No JVD; No  carotid bruits CARDIAC: Regular rate and rhythm, no murmurs, rubs, gallops RESPIRATORY:  diminished breath sounds bilaterally, upper airway secretions, no wheezing ABDOMEN: Soft, non-tender, non-distended EXTREMITIES:  No edema; No deformity   ASSESSMENT AND PLAN:    PVC's  Unifocal outflow tract PVC.  He is asymptomatic. LVEF 60-65% as above.  -ZIO pending to quantify PVC burden   -continue metoprolol XL 100 mg daily for now given preserved LVEF -if PVC burden on ZIO is >20% or symptom triggered episodes on ZIO that are associated with frequent PVC's, consider increasing Toprol vs starting an AAD for suppression -pt unfortunately did not receive his ZIO, set up here today in clinic  Hypertension  -well controlled on current regimen    HLD  -statin per primary   Follow up with Dr. Jimmey Ralph or EP APP in 4 weeks  Signed, Canary Brim, MSN, APRN, NP-C, AGACNP-BC [facility] - Electrophysiology  06/17/2023, 3:46 PM

## 2023-06-17 ENCOUNTER — Encounter: Payer: Self-pay | Admitting: Pulmonary Disease

## 2023-06-17 ENCOUNTER — Ambulatory Visit: Payer: 59 | Attending: Pulmonary Disease | Admitting: Pulmonary Disease

## 2023-06-17 VITALS — BP 130/72 | HR 86 | Ht 70.0 in | Wt 215.0 lb

## 2023-06-17 DIAGNOSIS — I493 Ventricular premature depolarization: Secondary | ICD-10-CM

## 2023-06-17 NOTE — Patient Instructions (Signed)
Medication Instructions:  Your physician recommends that you continue on your current medications as directed. Please refer to the Current Medication list given to you today.  *If you need a refill on your cardiac medications before your next appointment, please call your pharmacy*  Lab Work: None ordered If you have labs (blood work) drawn today and your tests are completely normal, you will receive your results only by: MyChart Message (if you have MyChart) OR A paper copy in the mail If you have any lab test that is abnormal or we need to change your treatment, we will call you to review the results.  Follow-Up: At [facility], you and your health needs are our priority.  As part of our continuing mission to provide you with exceptional heart care, we have created designated Provider Care Teams.  These Care Teams include your primary Cardiologist (physician) and Advanced Practice Providers (APPs -  Physician Assistants and Nurse Practitioners) who all work together to provide you with the care you need, when you need it.  Your next appointment:   1 month(s)  Provider:   Nobie Putnam, MD or Canary Brim, NP

## 2023-06-26 ENCOUNTER — Other Ambulatory Visit: Payer: Self-pay | Admitting: Family Medicine

## 2023-06-26 DIAGNOSIS — E785 Hyperlipidemia, unspecified: Secondary | ICD-10-CM

## 2023-07-01 ENCOUNTER — Other Ambulatory Visit: Payer: Self-pay | Admitting: Family Medicine

## 2023-07-01 DIAGNOSIS — I10 Essential (primary) hypertension: Secondary | ICD-10-CM

## 2023-07-01 DIAGNOSIS — F419 Anxiety disorder, unspecified: Secondary | ICD-10-CM

## 2023-07-15 ENCOUNTER — Other Ambulatory Visit: Payer: Self-pay | Admitting: Family Medicine

## 2023-07-17 DIAGNOSIS — I493 Ventricular premature depolarization: Secondary | ICD-10-CM | POA: Diagnosis not present

## 2023-07-20 ENCOUNTER — Encounter: Payer: Self-pay | Admitting: Cardiology

## 2023-07-20 ENCOUNTER — Telehealth: Payer: Self-pay

## 2023-07-20 ENCOUNTER — Ambulatory Visit: Payer: 59 | Attending: Cardiology | Admitting: Cardiology

## 2023-07-20 VITALS — BP 130/66 | HR 76 | Ht 70.0 in | Wt 205.0 lb

## 2023-07-20 DIAGNOSIS — I493 Ventricular premature depolarization: Secondary | ICD-10-CM

## 2023-07-20 DIAGNOSIS — G4730 Sleep apnea, unspecified: Secondary | ICD-10-CM | POA: Diagnosis not present

## 2023-07-20 NOTE — Progress Notes (Signed)
Electrophysiology Office Note:   Date:  07/20/2023  ID:  Eduardo Moon, DOB Jul 20, 1946, MRN [id number]  Primary Cardiologist: None Primary Heart Failure: None Electrophysiologist: Nobie Putnam, MD      History of Present Illness:   Eduardo Moon is a 77 y.o. male with h/o hypertension, gout, hyperlipidemia, and poliomyelitis (polio infection at age 44) who is being seen today for routine electrophysiology followup.   Since last being seen in our clinic the patient reports doing relatively well. He wore a Zio monitor which showed a PVC burden of 5.1%. He denies chest pain, palpitations, dyspnea, PND, orthopnea, nausea, vomiting, dizziness, syncope, edema, weight gain, or early satiety.   Review of systems complete and found to be negative unless listed in HPI.   EP Information / Studies Reviewed:    EKG is ordered today. Personal review as below.  EKG Interpretation Date/Time:  Monday July 20 2023 16:10:07 EST Ventricular Rate:  76 PR Interval:  202 QRS Duration:  86 QT Interval:  388 QTC Calculation: 436 R Axis:   37  Text Interpretation: Normal sinus rhythm Normal ECG When compared with ECG of 04-May-2023 15:34, No significant change was found Confirmed by Nobie Putnam [phone]) on 07/20/2023 4:37:07 PM   Zio Monitor 07/17/23:  HR 35 - 101, average 74 bpm. No atrial fibrillation detected. Rare supraventricular ectopy. Frequent ventricular ectopy, 5.1%. No sustained or non-sustained arrhythmias. 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 Symptom trigger episodes correspond to sinus rhythm.  Echo 06/16/23:  1. Left ventricular ejection fraction, by estimation, is 60 to 65%. The  left ventricle has normal function. The left ventricle has no regional  wall motion abnormalities. Left ventricular diastolic parameters are  indeterminate.   2. Right ventricular systolic function is normal. The right ventricular  size is normal. There is normal pulmonary artery systolic  pressure. The  estimated right ventricular systolic pressure is 33.7 mmHg.   3. Left atrial size was mild to moderately dilated.   4. The mitral valve is normal in structure. Trivial mitral valve  regurgitation. No evidence of mitral stenosis.   5. The aortic valve is normal in structure. Aortic valve regurgitation is  not visualized. No aortic stenosis is present.   6. The inferior vena cava is normal in size with greater than 50%  respiratory variability, suggesting right atrial pressure of 3 mmHg.   Risk Assessment/Calculations:         STOP-Bang Score:  7       Physical Exam:   VS:  BP 130/66   Pulse 76   Ht 5\' 10"  (1.778 m)   Wt 205 lb (93 kg)   SpO2 97%   BMI 29.41 kg/m    Wt Readings from Last 3 Encounters:  07/20/23 205 lb (93 kg)  06/17/23 215 lb (97.5 kg)  05/04/23 190 lb (86.2 kg)     GEN: Well nourished, well developed in no acute distress NECK: No JVD CARDIAC: Normal rate, regular rhythm. RESPIRATORY:  Clear to auscultation without rales, wheezing or rhonchi  ABDOMEN: Soft, non-tender, non-distended EXTREMITIES: Pedal edema bilaterally; No deformity   ASSESSMENT AND PLAN:   Eduardo Moon is a 77 y.o. male with h/o hypertension, gout, hyperlipidemia poliomyelitis (polio infection at age 62) c/b bilateral lower extremity weakness seen today for Electrophysiology evaluation of PVCs at the request of Hetty Blend, NP.    #. Unifocal outflow tract PVC: He is asymptomatic.  - Repeat echocardiogram showed normal LVEF.  - Zio monitor with 5.1% PVC  burden. - Continue metoprolol XL 100mg  once per day.   #. Sleep disordered breathing, suspected OSA: - Sleep study ordered.   Follow up with Dr. Jimmey Ralph in 12 months  Signed, Nobie Putnam, MD

## 2023-07-20 NOTE — Patient Instructions (Signed)
Medication Instructions:  Your physician recommends that you continue on your current medications as directed. Please refer to the Current Medication list given to you today.  *If you need a refill on your cardiac medications before your next appointment, please call your pharmacy*  Testing/Procedures: Your physician has recommended that you have a sleep study. This test records several body functions during sleep, including: brain activity, eye movement, oxygen and carbon dioxide blood levels, heart rate and rhythm, breathing rate and rhythm, the flow of air through your mouth and nose, snoring, body muscle movements, and chest and belly movement.  Follow-Up: At [facility], you and your health needs are our priority.  As part of our continuing mission to provide you with exceptional heart care, we have created designated Provider Care Teams.  These Care Teams include your primary Cardiologist (physician) and Advanced Practice Providers (APPs -  Physician Assistants and Nurse Practitioners) who all work together to provide you with the care you need, when you need it.  Your next appointment:   1 year  Provider:   You may see Nobie Putnam, MD or one of the following Advanced Practice Providers on your designated Care Team:   Francis Dowse, [place] [address] [place], [place] Sherie Don, NP Canary Brim, NP

## 2023-07-20 NOTE — Telephone Encounter (Signed)
Patient agreement reviewed and signed on 07/20/2023.  WatchPAT issued to patient on 07/20/2023 by Frutoso Schatz, RN. Patient aware to not open the WatchPAT box until contacted with the activation PIN. Patient profile initialized in Cloud[employer] on 07/20/2023 by Frutoso Schatz, RN. Device serial number: [id number]  Please list Reason for Call as Advice Only and type "WatchPAT issued to patient" in the comment box.

## 2023-07-26 ENCOUNTER — Other Ambulatory Visit: Payer: Self-pay | Admitting: Family Medicine

## 2023-07-26 DIAGNOSIS — M109 Gout, unspecified: Secondary | ICD-10-CM

## 2023-07-27 NOTE — Telephone Encounter (Signed)
**Note De-Identified Eduardo Moon Obfuscation** Ordering provider: Dr Jimmey Ralph Associated diagnoses: Sleep-disordered breathing G47.30 WatchPAT PA obtained on 07/27/2023 by Delia Sitar, Lorelle Formosa, LPN. Authorization: Per [facility] Provider Portal: Prior Authorization/Notification is not required for the requested service(s). Decision ID #: [id number] Patient not notified of PIN ([id number]) on 07/27/2023 as he was unavailable per his daughter. She stated that she will have him call me back.   Phone note routed to covering staff for follow-up.

## 2023-07-28 NOTE — Telephone Encounter (Signed)
**Note De-Identified Pepper Kerrick Obfuscation** The pt called me back today and did give me verbal permission over the phone to s/w his daughter, Eleanor on his behalf. I did provide Melissa with the pts WatchPAT One-HST device PIN # [id number] and she is aware to call us  back if they have any questions or concerns.

## 2023-07-30 ENCOUNTER — Other Ambulatory Visit (INDEPENDENT_AMBULATORY_CARE_PROVIDER_SITE_OTHER): Payer: 59

## 2023-07-30 ENCOUNTER — Encounter: Payer: Self-pay | Admitting: Family Medicine

## 2023-07-30 ENCOUNTER — Ambulatory Visit: Payer: 59 | Admitting: Family Medicine

## 2023-07-30 VITALS — BP 134/66 | HR 75 | Temp 97.6°F | Ht 70.0 in

## 2023-07-30 DIAGNOSIS — R29898 Other symptoms and signs involving the musculoskeletal system: Secondary | ICD-10-CM

## 2023-07-30 DIAGNOSIS — R600 Localized edema: Secondary | ICD-10-CM | POA: Diagnosis not present

## 2023-07-30 DIAGNOSIS — I10 Essential (primary) hypertension: Secondary | ICD-10-CM | POA: Diagnosis not present

## 2023-07-30 DIAGNOSIS — R946 Abnormal results of thyroid function studies: Secondary | ICD-10-CM | POA: Diagnosis not present

## 2023-07-30 DIAGNOSIS — Z7409 Other reduced mobility: Secondary | ICD-10-CM | POA: Diagnosis not present

## 2023-07-30 DIAGNOSIS — N183 Chronic kidney disease, stage 3 unspecified: Secondary | ICD-10-CM | POA: Diagnosis not present

## 2023-07-30 DIAGNOSIS — D649 Anemia, unspecified: Secondary | ICD-10-CM

## 2023-07-30 DIAGNOSIS — R7989 Other specified abnormal findings of blood chemistry: Secondary | ICD-10-CM

## 2023-07-30 LAB — CBC WITH DIFFERENTIAL/PLATELET
Basophils Absolute: 0.1 10*3/uL (ref 0.0–0.1)
Basophils Relative: 0.9 % (ref 0.0–3.0)
Eosinophils Absolute: 0.7 10*3/uL (ref 0.0–0.7)
Eosinophils Relative: 7.7 % — ABNORMAL HIGH (ref 0.0–5.0)
HCT: 36.3 % — ABNORMAL LOW (ref 39.0–52.0)
Hemoglobin: 11.8 g/dL — ABNORMAL LOW (ref 13.0–17.0)
Lymphocytes Relative: 21.1 % (ref 12.0–46.0)
Lymphs Abs: 2 10*3/uL (ref 0.7–4.0)
MCHC: 32.6 g/dL (ref 30.0–36.0)
MCV: 97.9 fL (ref 78.0–100.0)
Monocytes Absolute: 0.7 10*3/uL (ref 0.1–1.0)
Monocytes Relative: 7.2 % (ref 3.0–12.0)
Neutro Abs: 5.8 10*3/uL (ref 1.4–7.7)
Neutrophils Relative %: 63.1 % (ref 43.0–77.0)
Platelets: 219 10*3/uL (ref 150.0–400.0)
RBC: 3.71 Mil/uL — ABNORMAL LOW (ref 4.22–5.81)
RDW: 16.6 % — ABNORMAL HIGH (ref 11.5–15.5)
WBC: 9.2 10*3/uL (ref 4.0–10.5)

## 2023-07-30 MED ORDER — FUROSEMIDE 20 MG PO TABS: 20.0000 mg | ORAL_TABLET | Freq: Every day | ORAL | 0 refills | Status: AC

## 2023-07-30 NOTE — Assessment & Plan Note (Signed)
Controlled. Continue current medication regimen.

## 2023-07-30 NOTE — Progress Notes (Signed)
 Subjective:     Patient ID: Eduardo Moon, male    DOB: [birth year], 78 y.o.   MRN: [id number]  Chief Complaint  Patient presents with   Medical Management of Chronic Issues    3 month f/u, fluid in legs would like something to help    HPI   History of Present Illness          Here to follow up on chronic health conditions.   C/o LE edema, worse on left. His left leg is his weakest leg. Lack of motor function of LLE. Requests diuretic which has helped in the past.   His orthopedist ordered a long leg brace with [employer] for knee ankle and foot orthosis however, he states it was not approved.    HTN- amlodipine  5 mg daily, lisinopril  30 mg daily, metoprolol  XL 100 mg.       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   uses w/c for distance    Past Surgical History:  Procedure Laterality Date   ANKLE RECONSTRUCTION  1957   BILATERAL   ORCHIECTOMY  05/07/2012   Procedure: ORCHIECTOMY;  Surgeon: Alm GORMAN Fragmin, MD;  Location: [facility];  Service: Urology;  Laterality: Right;   RADIOACTIVE PROSTATE SEED IMPLANTS  04-21-2011    DR FRAGMIN   PROSTATE CANCER   SCROTAL EXPLORATION  05/07/2012   Procedure: SCROTUM EXPLORATION;  Surgeon: Alm GORMAN Fragmin, MD;  Location: [facility];  Service: Urology;  Laterality: Right;  DRAINAGE OF ABSCESS  1 HR  UHC MEDICARE    Family  History  Problem Relation Age of Onset   Cancer Mother    Cancer Father    Cancer Brother     Social History   Socioeconomic History   Marital status: Widowed    Spouse name: Not on file   Number of children: Not on file   Years of education: Not on file   Highest education level: Not on file  Occupational History   Not on file  Tobacco Use   Smoking status: Never   Smokeless tobacco: Never  Vaping Use   Vaping status: Never Used  Substance and Sexual Activity   Alcohol use: No   Drug use: No   Sexual activity: Not Currently  Other Topics Concern   Not on file  Social History Narrative   Not on file   Social Drivers of Health   Financial Resource Strain: Low Risk  (07/01/2022)   Overall Financial Resource Strain (CARDIA)    Difficulty of Paying Living Expenses: Not hard at all  Food Insecurity: No Food Insecurity (07/01/2022)   Hunger Vital Sign    Worried About Running Out of Food in the Last Year: Never true    Ran  Out of Food in the Last Year: Never true  Transportation Needs: No Transportation Needs (07/01/2022)   PRAPARE - Administrator, Civil Service (Medical): No    Lack of Transportation (Non-Medical): No  Physical Activity: Inactive (07/01/2022)   Exercise Vital Sign    Days of Exercise per Week: 0 days    Minutes of Exercise per Session: 0 min  Stress: No Stress Concern Present (07/01/2022)   [employer] of Occupational Health - Occupational Stress Questionnaire    Feeling of Stress : Not at all  Social Connections: Moderately Isolated (07/01/2022)   Social Connection and Isolation Panel [NHANES]    Frequency of Communication with Friends and Family: More than three times a week    Frequency of Social Gatherings with Friends and Family: Three times a week    Attends Religious Services: 1 to 4 times per year    Active Member of Clubs or Organizations: No    Attends Banker Meetings: Never    Marital Status: Widowed  Intimate  Partner Violence: Not At Risk (07/01/2022)   Humiliation, Afraid, Rape, and Kick questionnaire    Fear of Current or Ex-Partner: No    Emotionally Abused: No    Physically Abused: No    Sexually Abused: No    Outpatient Medications Prior to Visit  Medication Sig Dispense Refill   albuterol  (PROVENTIL ) (2.5 MG/3ML) 0.083% nebulizer solution Take 3 mLs (2.5 mg total) by nebulization every 6 (six) hours as needed for wheezing or shortness of breath. 75 mL 1   albuterol  (VENTOLIN  HFA) 108 (90 Base) MCG/ACT inhaler TAKE 2 PUFFS BY MOUTH EVERY 6 HOURS AS NEEDED FOR WHEEZE OR SHORTNESS OF BREATH 18 g 1   allopurinol  (ZYLOPRIM ) 300 MG tablet TAKE 1 TABLET BY MOUTH EVERY DAY 90 tablet 0   amLODipine  (NORVASC ) 5 MG tablet TAKE 1 TABLET (5 MG TOTAL) BY MOUTH DAILY. 90 tablet 1   lisinopril  (ZESTRIL ) 30 MG tablet TAKE 1 TABLET BY MOUTH EVERY DAY 90 tablet 1   metoprolol  succinate (TOPROL -XL) 100 MG 24 hr tablet Take 1 tablet (100 mg total) by mouth daily. Take with or immediately following a meal. 90 tablet 0   naproxen  (NAPROSYN ) 375 MG tablet TAKE 1 TABLET BY MOUTH TWICE A DAY AS NEEDED FOR JOINT PAIN 60 tablet 2   omeprazole  (PRILOSEC) 20 MG capsule TAKE 1 CAPSULE BY MOUTH TWICE A DAY 180 capsule 1   rosuvastatin  (CRESTOR ) 5 MG tablet TAKE 1 TABLET BY MOUTH EVERY DAY IN THE EVENING 90 tablet 1   sertraline  (ZOLOFT ) 50 MG tablet TAKE 1 TABLET BY MOUTH EVERY DAY 90 tablet 1   tamsulosin  (FLOMAX ) 0.4 MG CAPS capsule Take 1 capsule (0.4 mg total) by mouth daily. 30 capsule 0   No facility-administered medications prior to visit.    No Known Allergies  Review of Systems  Constitutional:  Negative for chills, fever and malaise/fatigue.  Respiratory:  Negative for shortness of breath.   Cardiovascular:  Positive for leg swelling. Negative for chest pain and palpitations.  Gastrointestinal:  Negative for abdominal pain, constipation, diarrhea, nausea and vomiting.  Genitourinary:  Negative for dysuria,  frequency and urgency.  Neurological:  Negative for dizziness and headaches.       Objective:    Physical Exam Constitutional:      General: He is not in acute distress.    Appearance: He is not ill-appearing.     Comments: In wheelchair  Eyes:  Extraocular Movements: Extraocular movements intact.     Conjunctiva/sclera: Conjunctivae normal.  Cardiovascular:     Rate and Rhythm: Normal rate and regular rhythm.  Pulmonary:     Effort: Pulmonary effort is normal.     Breath sounds: Normal breath sounds.  Musculoskeletal:     Cervical back: Normal range of motion and neck supple.     Right lower leg: Edema present.     Left lower leg: Edema present.     Comments: LLE with 1+pitting, RLE trace   Skin:    General: Skin is warm and dry.  Neurological:     Mental Status: He is alert and oriented to person, place, and time.     Motor: Weakness present.     Comments: LLE weakness (chronic)  Psychiatric:        Mood and Affect: Mood normal.        Behavior: Behavior normal.        Thought Content: Thought content normal.      BP 134/66 (BP Location: Left Arm, Patient Position: Sitting, Cuff Size: Large)   Pulse 75   Temp 97.6 F (36.4 C) (Temporal)   Ht 5' 10 (1.778 m)   SpO2 96%   BMI 29.41 kg/m  Wt Readings from Last 3 Encounters:  07/20/23 205 lb (93 kg)  06/17/23 215 lb (97.5 kg)  05/04/23 190 lb (86.2 kg)        Assessment & Plan:   Problem List Items Addressed This Visit     Anemia   Check labs including ferritin, folate and B12. 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  He has done PT in past. Recent visit to ortho. Unfortunately, the long leg brace recommended by Dr. Harden for LLE mobility was not approved. The brace would be beneficial and  potentially help the patient to stand and ambulate.        Chronic kidney disease   Monitor. Refer to nephrologist if worsening.       Relevant Orders   Comprehensive metabolic panel   Elevated TSH   Recheck TSH      Relevant Orders   TSH   T4, free   HTN (hypertension) - Primary   Controlled. Continue current medication regimen       Relevant Medications   furosemide  (LASIX ) 20 MG tablet   Other Relevant Orders   CBC with Differential/Platelet   Comprehensive metabolic panel   Leg edema, left   Due to limited mobility. 5 day course of furosemide  prescribed. Limit sodium.       Relevant Medications   furosemide  (LASIX ) 20 MG tablet   Limited mobility   Transport chair and regular manual wheelchair.       Reviewed notes from orthopedist and cardiologists.     I am having Earnest Lot Del start on furosemide . I am also having him maintain his tamsulosin , albuterol , omeprazole , naproxen , lisinopril , metoprolol  succinate, rosuvastatin , sertraline , amLODipine , albuterol , and allopurinol .  Meds ordered this encounter  Medications   furosemide  (LASIX ) 20 MG tablet    Sig: Take 1 tablet (20 mg total) by mouth daily.    Dispense:  5 tablet    Refill:  0    Supervising Provider:  CRAWFORD, ELIZABETH A [[id number]]

## 2023-07-30 NOTE — Assessment & Plan Note (Signed)
 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  He has done PT in past. Recent visit to ortho. Unfortunately, the long leg brace recommended by Dr. Harden for LLE mobility was not approved. The brace would be beneficial and potentially help the patient to stand and ambulate.

## 2023-07-30 NOTE — Assessment & Plan Note (Signed)
 Recheck TSH

## 2023-07-30 NOTE — Assessment & Plan Note (Signed)
 Check labs including ferritin, folate and B12. Refer to GI pending results

## 2023-07-30 NOTE — Assessment & Plan Note (Signed)
 Due to limited mobility. 5 day course of furosemide prescribed. Limit sodium.

## 2023-07-30 NOTE — Patient Instructions (Addendum)
 Please go to  the lab at [address] [facility] in the basement. Press "B" on the elevator.   Continue your current medications.   Take the fluid medication for edema. Let me know if it helps or not.

## 2023-07-30 NOTE — Assessment & Plan Note (Signed)
Transport chair and regular manual wheelchair.

## 2023-07-30 NOTE — Assessment & Plan Note (Signed)
 Monitor. Refer to nephrologist if worsening.

## 2023-07-31 LAB — COMPREHENSIVE METABOLIC PANEL
ALT: 15 U/L (ref 0–53)
AST: 8 U/L (ref 0–37)
Albumin: 3.9 g/dL (ref 3.5–5.2)
Alkaline Phosphatase: 96 U/L (ref 39–117)
BUN: 34 mg/dL — ABNORMAL HIGH (ref 6–23)
CO2: 19 meq/L (ref 19–32)
Calcium: 8.6 mg/dL (ref 8.4–10.5)
Chloride: 110 meq/L (ref 96–112)
Creatinine, Ser: 1.5 mg/dL (ref 0.40–1.50)
GFR: 43.96 mL/min — ABNORMAL LOW (ref >60.00)
Glucose, Bld: 115 mg/dL — ABNORMAL HIGH (ref 70–99)
Potassium: 4.5 meq/L (ref 3.5–5.1)
Sodium: 140 meq/L (ref 135–145)
Total Bilirubin: 0.3 mg/dL (ref 0.2–1.2)
Total Protein: 6.7 g/dL (ref 6.0–8.3)

## 2023-07-31 LAB — VITAMIN B12: Vitamin B-12: 185 pg/mL — ABNORMAL LOW (ref 211–911)

## 2023-07-31 LAB — TSH: TSH: 8.97 u[IU]/mL — ABNORMAL HIGH (ref 0.35–5.50)

## 2023-07-31 LAB — FERRITIN: Ferritin: 102.1 ng/mL (ref 22.0–322.0)

## 2023-07-31 LAB — FOLATE: Folate: 22.8 ng/mL (ref >5.9)

## 2023-07-31 LAB — T4, FREE: Free T4: 0.65 ng/dL (ref 0.60–1.60)

## 2023-08-04 ENCOUNTER — Encounter: Payer: Self-pay | Admitting: Family Medicine

## 2023-08-04 ENCOUNTER — Other Ambulatory Visit: Payer: Self-pay | Admitting: Family Medicine

## 2023-08-04 DIAGNOSIS — D649 Anemia, unspecified: Secondary | ICD-10-CM

## 2023-08-04 DIAGNOSIS — E038 Other specified hypothyroidism: Secondary | ICD-10-CM | POA: Insufficient documentation

## 2023-08-04 DIAGNOSIS — N1832 Chronic kidney disease, stage 3b: Secondary | ICD-10-CM

## 2023-08-04 DIAGNOSIS — E538 Deficiency of other specified B group vitamins: Secondary | ICD-10-CM | POA: Insufficient documentation

## 2023-08-04 HISTORY — DX: Other specified hypothyroidism: E03.8

## 2023-08-04 NOTE — Progress Notes (Signed)
 Please let him know that his vitamin B12 is very low and I recommend coming in for vitamin B12 injections weekly for the next 4 weeks and then monthly going forward. He should also take an over the counter multivitamin if he isn't already. Also, his kidney function is worsening so I would like to refer him to a kidney specialist (nephrologist). Has he ever seen someone? His declining kidney function may be causing his anemia. I am also monitoring his thyroid  function. Please follow up with me in 3 months.

## 2023-08-13 ENCOUNTER — Other Ambulatory Visit: Payer: Self-pay | Admitting: Family Medicine

## 2023-08-13 DIAGNOSIS — R197 Diarrhea, unspecified: Secondary | ICD-10-CM

## 2023-08-14 ENCOUNTER — Ambulatory Visit: Payer: 59

## 2023-08-21 ENCOUNTER — Ambulatory Visit (INDEPENDENT_AMBULATORY_CARE_PROVIDER_SITE_OTHER): Payer: 59

## 2023-08-21 DIAGNOSIS — E538 Deficiency of other specified B group vitamins: Secondary | ICD-10-CM | POA: Diagnosis not present

## 2023-08-21 MED ORDER — CYANOCOBALAMIN 1000 MCG/ML IJ SOLN
1000.0000 ug | Freq: Once | INTRAMUSCULAR | Status: AC
  Administered 2023-08-21: 1000 ug via INTRAMUSCULAR

## 2023-08-21 NOTE — Progress Notes (Signed)
After obtaining consent, and per orders of Hetty Blend, NP-C, injection of B12 given by Ferdie Ping. Patient instructed to report any adverse reaction to me immediately.

## 2023-08-26 ENCOUNTER — Other Ambulatory Visit: Payer: Self-pay | Admitting: Family Medicine

## 2023-08-27 ENCOUNTER — Other Ambulatory Visit: Payer: Self-pay

## 2023-08-27 MED ORDER — ALBUTEROL SULFATE (2.5 MG/3ML) 0.083% IN NEBU: 2.5000 mg | INHALATION_SOLUTION | Freq: Four times a day (QID) | RESPIRATORY_TRACT | 0 refills | Status: AC | PRN

## 2023-09-04 ENCOUNTER — Ambulatory Visit: Payer: 59

## 2023-09-10 ENCOUNTER — Ambulatory Visit: Payer: 59

## 2023-09-25 ENCOUNTER — Ambulatory Visit: Payer: 59

## 2023-09-25 DIAGNOSIS — E538 Deficiency of other specified B group vitamins: Secondary | ICD-10-CM

## 2023-09-25 MED ORDER — CYANOCOBALAMIN 1000 MCG/ML IJ SOLN
1000.0000 ug | Freq: Once | INTRAMUSCULAR | Status: AC
  Administered 2023-09-25: 1000 ug via INTRAMUSCULAR

## 2023-09-25 NOTE — Progress Notes (Signed)
 Pt here for monthly B12 injection per Beather Arbour, NP-C  B12 given IM and pt tolerated injection well.  Next B12 injection has been scheduled.

## 2023-09-27 ENCOUNTER — Other Ambulatory Visit: Payer: Self-pay | Admitting: Family Medicine

## 2023-09-27 DIAGNOSIS — I10 Essential (primary) hypertension: Secondary | ICD-10-CM

## 2023-09-27 DIAGNOSIS — I493 Ventricular premature depolarization: Secondary | ICD-10-CM

## 2023-09-30 ENCOUNTER — Other Ambulatory Visit: Payer: Self-pay | Admitting: Family Medicine

## 2023-10-02 ENCOUNTER — Emergency Department (HOSPITAL_COMMUNITY)
Admission: EM | Admit: 2023-10-02 | Discharge: 2023-10-02 | Disposition: A | Discharge status: Home / Self Care | Attending: Emergency Medicine | Admitting: Emergency Medicine

## 2023-10-02 ENCOUNTER — Other Ambulatory Visit: Payer: Self-pay

## 2023-10-02 ENCOUNTER — Encounter (HOSPITAL_COMMUNITY): Payer: Self-pay

## 2023-10-02 ENCOUNTER — Emergency Department (HOSPITAL_COMMUNITY)

## 2023-10-02 DIAGNOSIS — B9689 Other specified bacterial agents as the cause of diseases classified elsewhere: Secondary | ICD-10-CM | POA: Diagnosis not present

## 2023-10-02 DIAGNOSIS — Z8546 Personal history of malignant neoplasm of prostate: Secondary | ICD-10-CM | POA: Insufficient documentation

## 2023-10-02 DIAGNOSIS — R140 Abdominal distension (gaseous): Secondary | ICD-10-CM | POA: Diagnosis not present

## 2023-10-02 DIAGNOSIS — N390 Urinary tract infection, site not specified: Secondary | ICD-10-CM | POA: Insufficient documentation

## 2023-10-02 DIAGNOSIS — R197 Diarrhea, unspecified: Secondary | ICD-10-CM | POA: Diagnosis not present

## 2023-10-02 DIAGNOSIS — I10 Essential (primary) hypertension: Secondary | ICD-10-CM | POA: Insufficient documentation

## 2023-10-02 DIAGNOSIS — R531 Weakness: Secondary | ICD-10-CM | POA: Diagnosis not present

## 2023-10-02 DIAGNOSIS — Z79899 Other long term (current) drug therapy: Secondary | ICD-10-CM | POA: Diagnosis not present

## 2023-10-02 DIAGNOSIS — Z8719 Personal history of other diseases of the digestive system: Secondary | ICD-10-CM | POA: Diagnosis not present

## 2023-10-02 DIAGNOSIS — K5900 Constipation, unspecified: Secondary | ICD-10-CM | POA: Diagnosis not present

## 2023-10-02 DIAGNOSIS — R300 Dysuria: Secondary | ICD-10-CM | POA: Diagnosis present

## 2023-10-02 LAB — URINALYSIS, W/ REFLEX TO CULTURE (INFECTION SUSPECTED)
Bilirubin Urine: NEGATIVE
Glucose, UA: NEGATIVE mg/dL
Hgb urine dipstick: NEGATIVE
Ketones, ur: NEGATIVE mg/dL
Nitrite: NEGATIVE
Protein, ur: NEGATIVE mg/dL
Specific Gravity, Urine: 1.018 (ref 1.005–1.030)
pH: 5 (ref 5.0–8.0)

## 2023-10-02 LAB — COMPREHENSIVE METABOLIC PANEL
ALT: 22 U/L (ref 0–44)
AST: 11 U/L — ABNORMAL LOW (ref 15–41)
Albumin: 3.6 g/dL (ref 3.5–5.0)
Alkaline Phosphatase: 101 U/L (ref 38–126)
Anion gap: 9 (ref 5–15)
BUN: 17 mg/dL (ref 8–23)
CO2: 23 mmol/L (ref 22–32)
Calcium: 8.2 mg/dL — ABNORMAL LOW (ref 8.9–10.3)
Chloride: 109 mmol/L (ref 98–111)
Creatinine, Ser: 1.04 mg/dL (ref 0.61–1.24)
GFR, Estimated: >60 mL/min (ref >60)
Glucose, Bld: 117 mg/dL — ABNORMAL HIGH (ref 70–99)
Potassium: 3.6 mmol/L (ref 3.5–5.1)
Sodium: 141 mmol/L (ref 135–145)
Total Bilirubin: 0.7 mg/dL (ref 0.0–1.2)
Total Protein: 6.9 g/dL (ref 6.5–8.1)

## 2023-10-02 LAB — CBC
HCT: 39.4 % (ref 39.0–52.0)
Hemoglobin: 12.5 g/dL — ABNORMAL LOW (ref 13.0–17.0)
MCH: 30.7 pg (ref 26.0–34.0)
MCHC: 31.7 g/dL (ref 30.0–36.0)
MCV: 96.8 fL (ref 80.0–100.0)
Platelets: 257 10*3/uL (ref 150–400)
RBC: 4.07 MIL/uL — ABNORMAL LOW (ref 4.22–5.81)
RDW: 15.4 % (ref 11.5–15.5)
WBC: 11.1 10*3/uL — ABNORMAL HIGH (ref 4.0–10.5)
nRBC: 0 % (ref 0.0–0.2)

## 2023-10-02 MED ORDER — CEPHALEXIN 500 MG PO CAPS
500.0000 mg | ORAL_CAPSULE | Freq: Once | ORAL | Status: AC
  Administered 2023-10-02: 500 mg via ORAL
  Filled 2023-10-02: qty 1

## 2023-10-02 MED ORDER — CEPHALEXIN 500 MG PO CAPS
500.0000 mg | ORAL_CAPSULE | Freq: Three times a day (TID) | ORAL | 0 refills | Status: AC
Start: 2023-10-02 — End: 2023-10-09

## 2023-10-02 MED ORDER — METAMUCIL SMOOTH TEXTURE 58.6 % PO POWD: 1.0000 | Freq: Three times a day (TID) | ORAL | 0 refills | Status: AC | PRN

## 2023-10-02 NOTE — ED Provider Notes (Signed)
 [place] EMERGENCY DEPARTMENT AT [facility] Provider Note   CSN: [id number] Arrival date & time: 10/02/23  1126     History  Chief Complaint  Patient presents with   Diarrhea   Dysuria   Weakness    Eduardo Moon is a 78 y.o. male.   Diarrhea Dysuria Presenting symptoms: dysuria   Associated symptoms: diarrhea   Weakness Associated symptoms: diarrhea and dysuria      Patient has a history of hypertension arthritis prostate cancer polio who presents to the ED for evaluation of 1-2 episodes of watery loose stools a day over the last week or 2.  Patient has not had any abdominal pain.  No blood in his stool.  He is not having any vomiting or nausea.  Patient also has had some foul odor to his urine.  Family is concerned he might have a urinary tract infection.  They also concerned that he might have a fecal impaction as apparently this occurred in the past.  He has not seen anyone for the symptoms.  Patient states he came to the ED today to get checked out since it has been going on for so long  Home Medications Prior to Admission medications   Medication Sig Start Date End Date Taking? Authorizing Provider  cephALEXin (KEFLEX) 500 MG capsule Take 1 capsule (500 mg total) by mouth 3 (three) times daily for 7 days. 10/02/23 10/09/23 Yes Linwood Dibbles, MD  albuterol (PROVENTIL) (2.5 MG/3ML) 0.083% nebulizer solution Take 3 mLs (2.5 mg total) by nebulization every 6 (six) hours as needed for wheezing or shortness of breath. 08/27/23   Henson, Vickie L, NP-C  albuterol (VENTOLIN HFA) 108 (90 Base) MCG/ACT inhaler TAKE 2 PUFFS BY MOUTH EVERY 6 HOURS AS NEEDED FOR WHEEZE OR SHORTNESS OF BREATH 09/30/23   Henson, Vickie L, NP-C  allopurinol (ZYLOPRIM) 300 MG tablet TAKE 1 TABLET BY MOUTH EVERY DAY 07/27/23   Henson, Vickie L, NP-C  amLODipine (NORVASC) 5 MG tablet TAKE 1 TABLET (5 MG TOTAL) BY MOUTH DAILY. 07/01/23   Henson, Vickie L, NP-C  furosemide (LASIX) 20 MG tablet Take 1 tablet  (20 mg total) by mouth daily. 07/30/23   Henson, Vickie L, NP-C  lisinopril (ZESTRIL) 30 MG tablet TAKE 1 TABLET BY MOUTH EVERY DAY 04/06/23   Henson, Vickie L, NP-C  metoprolol succinate (TOPROL-XL) 100 MG 24 hr tablet TAKE 1 TABLET BY MOUTH DAILY. TAKE WITH OR IMMEDIATELY FOLLOWING A MEAL. 09/28/23   Henson, Vickie L, NP-C  naproxen (NAPROSYN) 375 MG tablet TAKE 1 TABLET BY MOUTH TWICE A DAY AS NEEDED FOR JOINT PAIN 08/26/23   Henson, Vickie L, NP-C  omeprazole (PRILOSEC) 20 MG capsule TAKE 1 CAPSULE BY MOUTH TWICE A DAY 08/13/23   Henson, Vickie L, NP-C  rosuvastatin (CRESTOR) 5 MG tablet TAKE 1 TABLET BY MOUTH EVERY DAY IN THE EVENING 06/29/23   Henson, Vickie L, NP-C  sertraline (ZOLOFT) 50 MG tablet TAKE 1 TABLET BY MOUTH EVERY DAY 07/01/23   Henson, Vickie L, NP-C  tamsulosin (FLOMAX) 0.4 MG CAPS capsule Take 1 capsule (0.4 mg total) by mouth daily. 12/20/21   Albertine Grates, MD      Allergies    Patient has no known allergies.    Review of Systems   Review of Systems  Gastrointestinal:  Positive for diarrhea.  Genitourinary:  Positive for dysuria.  Neurological:  Positive for weakness.    Physical Exam Updated Vital Signs BP 118/72   Pulse 76  Temp 98.3 F (36.8 C) (Oral)   Resp 17   Ht 1.778 m (5\' 10" )   Wt 93 kg   SpO2 99%   BMI 29.42 kg/m  Physical Exam Vitals and nursing note reviewed.  Constitutional:      General: He is not in acute distress.    Appearance: He is well-developed.  HENT:     Head: Normocephalic and atraumatic.     Right Ear: External ear normal.     Left Ear: External ear normal.  Eyes:     General: No scleral icterus.       Right eye: No discharge.        Left eye: No discharge.     Conjunctiva/sclera: Conjunctivae normal.  Neck:     Trachea: No tracheal deviation.  Cardiovascular:     Rate and Rhythm: Normal rate.  Pulmonary:     Effort: Pulmonary effort is normal. No respiratory distress.     Breath sounds: No stridor.  Abdominal:     General:  There is no distension.     Palpations: There is no mass.     Tenderness: There is no abdominal tenderness. There is no guarding.  Genitourinary:    Comments: No fecal impaction noted Musculoskeletal:        General: No swelling or deformity.     Cervical back: Neck supple.  Skin:    General: Skin is warm and dry.     Findings: No rash.  Neurological:     Mental Status: He is alert. Mental status is at baseline.     Cranial Nerves: No dysarthria or facial asymmetry.     Motor: No seizure activity.     ED Results / Procedures / Treatments   Labs (all labs ordered are listed, but only abnormal results are displayed) Labs Reviewed  CBC - Abnormal; Notable for the following components:      Result Value   WBC 11.1 (*)    RBC 4.07 (*)    Hemoglobin 12.5 (*)    All other components within normal limits  COMPREHENSIVE METABOLIC PANEL - Abnormal; Notable for the following components:   Glucose, Bld 117 (*)    Calcium 8.2 (*)    AST 11 (*)    All other components within normal limits  URINALYSIS, W/ REFLEX TO CULTURE (INFECTION SUSPECTED) - Abnormal; Notable for the following components:   APPearance HAZY (*)    Leukocytes,Ua MODERATE (*)    Bacteria, UA FEW (*)    All other components within normal limits  URINE CULTURE    EKG None  Radiology No results found.  Procedures Procedures    Medications Ordered in ED Medications  cephALEXin (KEFLEX) capsule 500 mg (has no administration in time range)    ED Course/ Medical Decision Making/ A&P Clinical Course as of 10/02/23 1602  Fri Oct 02, 2023  1538 CBC does shows slight increase in white blood cell count.  Metabolic panel without signs of significant dehydration.  Urinalysis does suggest UTI [JK]    Clinical Course User Index [JK] Linwood Dibbles, MD                                 Medical Decision Making Amount and/or Complexity of Data Reviewed Labs: ordered. Radiology: ordered.  Risk OTC  drugs. Prescription drug management.   Pt presents with 1-2 episodes of loose stool per day.  Hx of prior fecal impaction.  No impaction noted on my exam.  Will xray to assess for stool burden.   Abd soft and non tender.  Low suspicion for colitis or diverticulitis.  UTI does show uti.  Will need oral abx.  Xrays pending.  Anticipate dc home.  Possible rx for metamucil bulking agent.  Care turned over to Dr Rubin Payor pending films.        Final Clinical Impression(s) / ED Diagnoses Final diagnoses:  Lower urinary tract infectious disease    Rx / DC Orders ED Discharge Orders          Ordered    cephALEXin (KEFLEX) 500 MG capsule  3 times daily        10/02/23 1546              Linwood Dibbles, MD 10/03/23 [phone]

## 2023-10-02 NOTE — ED Triage Notes (Signed)
 Pt arrived EMS. Reports diarrhea x 1 1/2 week, denies abdominal pain. No blood in stool per pt. Pt also complains of intermittent dysuria and foul odor. Denies any other bowel/bladder symptoms. NAD in triage. Pt non ambulatory per baseline.

## 2023-10-02 NOTE — Discharge Instructions (Signed)
 Does look like there is some constipation.  Hopefully the Metamucil will help.  Return for worsening pain.  Return for lightheadedness or dizziness.

## 2023-10-02 NOTE — ED Provider Notes (Signed)
  Physical Exam  BP (!) 158/83   Pulse 71   Temp 98.4 F (36.9 C)   Resp 18   Ht 5\' 10"  (1.778 m)   Wt 93 kg   SpO2 96%   BMI 29.42 kg/m   Physical Exam  Procedures  Procedures  ED Course / MDM   Clinical Course as of 10/02/23 2353  Fri Oct 02, 2023  1538 CBC does shows slight increase in white blood cell count.  Metabolic panel without signs of significant dehydration.  Urinalysis does suggest UTI [JK]    Clinical Course User Index [JK] Linwood Dibbles, MD   Medical Decision Making Amount and/or Complexity of Data Reviewed Labs: ordered. Radiology: ordered.  Risk OTC drugs. Prescription drug management.   X-ray does show potential stool ball.  No clear obstruction.  Will treat with bulking agents.  Discharge home with outpatient follow-up as needed.  Follow-up with GI as needed also.  Discussed with patient and family to return for worsening symptoms.       Benjiman Core, MD 10/02/23 [phone]

## 2023-10-04 LAB — URINE CULTURE: Culture: >=100000 — AB

## 2023-10-05 ENCOUNTER — Telehealth (HOSPITAL_BASED_OUTPATIENT_CLINIC_OR_DEPARTMENT_OTHER): Payer: Self-pay | Admitting: *Deleted

## 2023-10-05 NOTE — Telephone Encounter (Signed)
 Post ED Visit - Positive Culture Follow-up  Culture report reviewed by antimicrobial stewardship pharmacist: [facility] []  Enzo Bi, Pharm.D. [x]  Celedonio Miyamoto, [address].D., BCPS AQ-ID []  Garvin Fila, Pharm.D., BCPS []  Georgina Pillion, Pharm.D., BCPS []  [place], [place].D., BCPS, AAHIVP []  Estella Husk, Pharm.D., BCPS, AAHIVP []  Lysle Pearl, PharmD, BCPS []  Phillips Climes, PharmD, BCPS []  Agapito Games, PharmD, BCPS []  Verlan Friends, PharmD []  Mervyn Gay, PharmD, BCPS []  Vinnie Level, PharmD  [facility] []  Len Childs, PharmD []  Greer Pickerel, PharmD []  Adalberto Cole, PharmD []  Perlie Gold, Rph []  Lonell Face) Jean Rosenthal, PharmD []  Earl Many, PharmD []  Junita Push, PharmD []  Dorna Leitz, PharmD []  Terrilee Files, PharmD []  Lynann Beaver, PharmD []  Keturah Barre, PharmD []  Loralee Pacas, PharmD []  Bernadene Person, PharmD   Positive urine culture Treated with cephalexin, organism sensitive to the same and no further patient follow-up is required at this time.  Eduardo Moon 10/05/2023, 7:52 AM

## 2023-10-07 ENCOUNTER — Ambulatory Visit: Payer: 59

## 2023-10-21 ENCOUNTER — Encounter: Payer: Self-pay | Admitting: Family Medicine

## 2023-10-21 ENCOUNTER — Ambulatory Visit: Admitting: Family Medicine

## 2023-10-21 VITALS — BP 124/80 | HR 92 | Temp 97.6°F | Ht 70.0 in | Wt 205.0 lb

## 2023-10-21 DIAGNOSIS — L853 Xerosis cutis: Secondary | ICD-10-CM

## 2023-10-21 DIAGNOSIS — E538 Deficiency of other specified B group vitamins: Secondary | ICD-10-CM

## 2023-10-21 DIAGNOSIS — N390 Urinary tract infection, site not specified: Secondary | ICD-10-CM | POA: Diagnosis not present

## 2023-10-21 DIAGNOSIS — R7989 Other specified abnormal findings of blood chemistry: Secondary | ICD-10-CM | POA: Diagnosis not present

## 2023-10-21 DIAGNOSIS — D649 Anemia, unspecified: Secondary | ICD-10-CM

## 2023-10-21 DIAGNOSIS — N183 Chronic kidney disease, stage 3 unspecified: Secondary | ICD-10-CM

## 2023-10-21 DIAGNOSIS — R7303 Prediabetes: Secondary | ICD-10-CM | POA: Diagnosis not present

## 2023-10-21 LAB — BASIC METABOLIC PANEL WITH GFR
BUN: 18 mg/dL (ref 6–23)
CO2: 25 meq/L (ref 19–32)
Calcium: 8.6 mg/dL (ref 8.4–10.5)
Chloride: 108 meq/L (ref 96–112)
Creatinine, Ser: 1.21 mg/dL (ref 0.40–1.50)
GFR: 56.8 mL/min — ABNORMAL LOW (ref >60.00)
Glucose, Bld: 176 mg/dL — ABNORMAL HIGH (ref 70–99)
Potassium: 3.6 meq/L (ref 3.5–5.1)
Sodium: 141 meq/L (ref 135–145)

## 2023-10-21 LAB — CBC
HCT: 37.3 % — ABNORMAL LOW (ref 39.0–52.0)
Hemoglobin: 12.2 g/dL — ABNORMAL LOW (ref 13.0–17.0)
MCHC: 32.8 g/dL (ref 30.0–36.0)
MCV: 95.8 fl (ref 78.0–100.0)
Platelets: 246 10*3/uL (ref 150.0–400.0)
RBC: 3.89 Mil/uL — ABNORMAL LOW (ref 4.22–5.81)
RDW: 16.1 % — ABNORMAL HIGH (ref 11.5–15.5)
WBC: 7.6 10*3/uL (ref 4.0–10.5)

## 2023-10-21 LAB — HEMOGLOBIN A1C: Hgb A1c MFr Bld: 5.9 % (ref 4.6–6.5)

## 2023-10-21 MED ORDER — CYANOCOBALAMIN 1000 MCG/ML IJ SOLN
1000.0000 ug | Freq: Once | INTRAMUSCULAR | Status: AC
  Administered 2023-10-21: 1000 ug via INTRAMUSCULAR

## 2023-10-21 NOTE — Progress Notes (Unsigned)
 Subjective:     Patient ID: Eduardo Moon, male    DOB: 05-Mar-1946, 78 y.o.   MRN: [id number]  Chief Complaint  Patient presents with   Hospitalization Follow-up    HPI   History of Present Illness         He is here to follow-up on recent ED visit from October 02, 2023.  He was seen for constipation and UTI.  Completed a course of Keflex.  Denies constipation   Complains of very dry skin  HTN- amlodipine 5 mg daily, lisinopril 30 mg daily, metoprolol XL 100 mg.   He takes Lasix as needed for LE edema which is usually worse on the left due to his weakness to extremity.  CKD- referral placed to [facility]   Anemia- denies any visible blood loss   Last colonoscopy at [facility]   Vitamin B12 deficiency   Health Maintenance Due  Topic Date Due   Pneumonia Vaccine 65+ Years old (1 of 2 - PCV) Never done   FOOT EXAM  Never done   OPHTHALMOLOGY EXAM  Never done   Diabetic kidney evaluation - Urine ACR  Never done   DTaP/Tdap/Td (1 - Tdap) Never done   Medicare Annual Wellness (AWV)  07/02/2023    Past Medical History:  Diagnosis Date   Arthritis    History of prostate cancer s/p radiactive seed implants  03-24-2011   Hypertension    Mild acid reflux watches diet   Polio age 20   Polio    Scrotal abscess    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   Weakness 05/2020   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   uses w/c for distance    Past Surgical History:  Procedure Laterality Date   ANKLE RECONSTRUCTION  1957   BILATERAL   ORCHIECTOMY  05/07/2012   Procedure: ORCHIECTOMY;  Surgeon: Valetta Fuller, MD;  Location: [facility];  Service: Urology;  Laterality: Right;   RADIOACTIVE PROSTATE SEED IMPLANTS  04-21-2011    DR Isabel Caprice   PROSTATE CANCER   SCROTAL EXPLORATION  05/07/2012   Procedure: SCROTUM EXPLORATION;  Surgeon: Valetta Fuller, MD;  Location: [facility];  Service: Urology;  Laterality: Right;  DRAINAGE OF ABSCESS  1 HR  UHC  MEDICARE    Family History  Problem Relation Age of Onset   Cancer Mother    Cancer Father    Cancer Brother     Social History   Socioeconomic History   Marital status: Widowed    Spouse name: Not on file   Number of children: Not on file   Years of education: Not on file   Highest education level: Not on file  Occupational History   Not on file  Tobacco Use   Smoking status: Never   Smokeless tobacco: Never  Vaping Use   Vaping status: Never Used  Substance and Sexual Activity   Alcohol use: No   Drug use: No   Sexual activity: Not Currently  Other Topics Concern   Not on file  Social History Narrative   Not on file   Social Drivers of Health   Financial Resource Strain: Low Risk  (07/01/2022)   Overall Financial Resource Strain (CARDIA)    Difficulty of Paying Living Expenses: Not hard at all  Food Insecurity: No Food Insecurity (07/01/2022)   Hunger Vital Sign    Worried About Running Out of Food in the Last Year: Never true    Ran Out of Food in  the Last Year: Never true  Transportation Needs: No Transportation Needs (07/01/2022)   PRAPARE - Administrator, Civil Service (Medical): No    Lack of Transportation (Non-Medical): No  Physical Activity: Inactive (07/01/2022)   Exercise Vital Sign    Days of Exercise per Week: 0 days    Minutes of Exercise per Session: 0 min  Stress: No Stress Concern Present (07/01/2022)   [employer] of Occupational Health - Occupational Stress Questionnaire    Feeling of Stress : Not at all  Social Connections: Moderately Isolated (07/01/2022)   Social Connection and Isolation Panel [NHANES]    Frequency of Communication with Friends and Family: More than three times a week    Frequency of Social Gatherings with Friends and Family: Three times a week    Attends Religious Services: 1 to 4 times per year    Active Member of Clubs or Organizations: No    Attends Banker Meetings: Never    Marital  Status: Widowed  Intimate Partner Violence: Not At Risk (07/01/2022)   Humiliation, Afraid, Rape, and Kick questionnaire    Fear of Current or Ex-Partner: No    Emotionally Abused: No    Physically Abused: No    Sexually Abused: No    Outpatient Medications Prior to Visit  Medication Sig Dispense Refill   albuterol (PROVENTIL) (2.5 MG/3ML) 0.083% nebulizer solution Take 3 mLs (2.5 mg total) by nebulization every 6 (six) hours as needed for wheezing or shortness of breath. 75 mL 0   albuterol (VENTOLIN HFA) 108 (90 Base) MCG/ACT inhaler TAKE 2 PUFFS BY MOUTH EVERY 6 HOURS AS NEEDED FOR WHEEZE OR SHORTNESS OF BREATH 8.5 each 1   allopurinol (ZYLOPRIM) 300 MG tablet TAKE 1 TABLET BY MOUTH EVERY DAY 90 tablet 0   amLODipine (NORVASC) 5 MG tablet TAKE 1 TABLET (5 MG TOTAL) BY MOUTH DAILY. 90 tablet 1   furosemide (LASIX) 20 MG tablet Take 1 tablet (20 mg total) by mouth daily. 5 tablet 0   lisinopril (ZESTRIL) 30 MG tablet TAKE 1 TABLET BY MOUTH EVERY DAY 90 tablet 1   metoprolol succinate (TOPROL-XL) 100 MG 24 hr tablet TAKE 1 TABLET BY MOUTH DAILY. TAKE WITH OR IMMEDIATELY FOLLOWING A MEAL. 90 tablet 0   naproxen (NAPROSYN) 375 MG tablet TAKE 1 TABLET BY MOUTH TWICE A DAY AS NEEDED FOR JOINT PAIN 60 tablet 2   omeprazole (PRILOSEC) 20 MG capsule TAKE 1 CAPSULE BY MOUTH TWICE A DAY 180 capsule 1   psyllium (METAMUCIL SMOOTH TEXTURE) 58.6 % powder Take 1 packet by mouth 3 (three) times daily as needed (constipation). 283 g 0   rosuvastatin (CRESTOR) 5 MG tablet TAKE 1 TABLET BY MOUTH EVERY DAY IN THE EVENING 90 tablet 1   sertraline (ZOLOFT) 50 MG tablet TAKE 1 TABLET BY MOUTH EVERY DAY 90 tablet 1   tamsulosin (FLOMAX) 0.4 MG CAPS capsule Take 1 capsule (0.4 mg total) by mouth daily. 30 capsule 0   No facility-administered medications prior to visit.    No Known Allergies  Review of Systems  Constitutional:  Negative for chills, fever and malaise/fatigue.  Respiratory:  Negative for  shortness of breath.   Cardiovascular:  Negative for chest pain, palpitations and leg swelling.  Gastrointestinal:  Negative for abdominal pain, blood in stool, constipation, diarrhea, nausea and vomiting.  Genitourinary:  Negative for dysuria, frequency and urgency.  Musculoskeletal:  Negative for falls.  Neurological:  Negative for dizziness and focal weakness.  Psychiatric/Behavioral:  Negative for depression. The patient is not nervous/anxious.        Objective:    Physical Exam Constitutional:      General: He is not in acute distress.    Appearance: He is not ill-appearing.  Eyes:     Extraocular Movements: Extraocular movements intact.     Conjunctiva/sclera: Conjunctivae normal.  Cardiovascular:     Rate and Rhythm: Normal rate.  Pulmonary:     Effort: Pulmonary effort is normal.  Musculoskeletal:     Cervical back: Normal range of motion and neck supple.     Right lower leg: No edema.     Left lower leg: No edema.  Skin:    General: Skin is warm and dry.     Coloration: Skin is not pale.  Neurological:     General: No focal deficit present.     Mental Status: He is alert and oriented to person, place, and time.  Psychiatric:        Mood and Affect: Mood normal.        Behavior: Behavior normal.        Thought Content: Thought content normal.      BP 124/80 (BP Location: Left Arm, Patient Position: Sitting)   Pulse 92   Temp 97.6 F (36.4 C) (Temporal)   Ht 5\' 10"  (1.778 m)   Wt 205 lb (93 kg)   SpO2 94%   BMI 29.41 kg/m  Wt Readings from Last 3 Encounters:  10/21/23 205 lb (93 kg)  10/02/23 205 lb 0.4 oz (93 kg)  07/20/23 205 lb (93 kg)       Assessment & Plan:   Problem List Items Addressed This Visit     Acute lower UTI - Primary   Anemia   Relevant Orders   CBC (Completed)   Chronic kidney disease   Relevant Orders   Basic metabolic panel (Completed)   CBC (Completed)   Elevated TSH   Relevant Orders   TSH (Completed)   T4, free  (Completed)   Other Visit Diagnoses       B12 deficiency       Relevant Medications   cyanocobalamin (VITAMIN B12) injection 1,000 mcg (Completed)     Dry skin         Prediabetes       Relevant Orders   Hemoglobin A1c (Completed)      Reviewed notes from recent ED visit.  He completed a course of antibiotics.  Denies any urinary symptoms. B12 deficiency and he is getting weekly B12 injections x 4 weeks and then we will get monthly injections going forward. CKD and he has a referral to [facility] for further evaluation. TSH has been elevated.  Continue to monitor. Discussed using a good, thick moisturizer for his dry skin since he has not been using lotion recently. Anemia-mild.  Last colonoscopy done at [facility].  Vitamin B12 is most likely contributing as well as CKD.  Check CBC.  Iron was normal at his last visit.  Consider referral to GI if anemia significantly worsening.   I am having Everardo Pacific "Del" maintain his tamsulosin, lisinopril, rosuvastatin, sertraline, amLODipine, allopurinol, furosemide, omeprazole, naproxen, albuterol, metoprolol succinate, albuterol, and Metamucil Smooth Texture. We administered cyanocobalamin.  Meds ordered this encounter  Medications   cyanocobalamin (VITAMIN B12) injection 1,000 mcg

## 2023-10-21 NOTE — Patient Instructions (Addendum)
 Use a good and thick moisturizer for your dry skin.    Please call to schedule [facility]  Phone: [phone]

## 2023-10-22 LAB — T4, FREE: Free T4: 0.85 ng/dL (ref 0.60–1.60)

## 2023-10-22 LAB — TSH: TSH: 4.91 u[IU]/mL (ref 0.35–5.50)

## 2023-10-23 ENCOUNTER — Encounter: Payer: Self-pay | Admitting: Family Medicine

## 2023-10-25 ENCOUNTER — Other Ambulatory Visit: Payer: Self-pay | Admitting: Family Medicine

## 2023-10-25 DIAGNOSIS — M109 Gout, unspecified: Secondary | ICD-10-CM

## 2023-11-06 ENCOUNTER — Ambulatory Visit: Payer: 59 | Admitting: Family Medicine

## 2023-11-22 ENCOUNTER — Other Ambulatory Visit: Payer: Self-pay | Admitting: Family Medicine

## 2023-11-23 DIAGNOSIS — I129 Hypertensive chronic kidney disease with stage 1 through stage 4 chronic kidney disease, or unspecified chronic kidney disease: Secondary | ICD-10-CM | POA: Diagnosis not present

## 2023-11-23 DIAGNOSIS — D631 Anemia in chronic kidney disease: Secondary | ICD-10-CM | POA: Diagnosis not present

## 2023-11-23 DIAGNOSIS — N390 Urinary tract infection, site not specified: Secondary | ICD-10-CM | POA: Diagnosis not present

## 2023-11-23 DIAGNOSIS — Z791 Long term (current) use of non-steroidal anti-inflammatories (NSAID): Secondary | ICD-10-CM | POA: Diagnosis not present

## 2023-11-23 DIAGNOSIS — N1831 Chronic kidney disease, stage 3a: Secondary | ICD-10-CM | POA: Diagnosis not present

## 2023-12-02 ENCOUNTER — Ambulatory Visit: Admitting: Family Medicine

## 2023-12-18 ENCOUNTER — Encounter

## 2023-12-22 ENCOUNTER — Ambulatory Visit

## 2023-12-22 ENCOUNTER — Other Ambulatory Visit: Payer: Self-pay | Admitting: Family Medicine

## 2023-12-22 DIAGNOSIS — E785 Hyperlipidemia, unspecified: Secondary | ICD-10-CM

## 2023-12-26 ENCOUNTER — Other Ambulatory Visit: Payer: Self-pay | Admitting: Family Medicine

## 2023-12-26 DIAGNOSIS — I10 Essential (primary) hypertension: Secondary | ICD-10-CM

## 2023-12-26 DIAGNOSIS — I493 Ventricular premature depolarization: Secondary | ICD-10-CM

## 2023-12-28 ENCOUNTER — Other Ambulatory Visit: Payer: Self-pay | Admitting: Family Medicine

## 2023-12-28 DIAGNOSIS — I10 Essential (primary) hypertension: Secondary | ICD-10-CM

## 2023-12-28 DIAGNOSIS — F419 Anxiety disorder, unspecified: Secondary | ICD-10-CM

## 2024-01-24 ENCOUNTER — Other Ambulatory Visit: Payer: Self-pay | Admitting: Family Medicine

## 2024-01-31 ENCOUNTER — Other Ambulatory Visit: Payer: Self-pay | Admitting: Family Medicine

## 2024-01-31 DIAGNOSIS — R197 Diarrhea, unspecified: Secondary | ICD-10-CM

## 2024-02-04 ENCOUNTER — Ambulatory Visit: Admitting: Family Medicine

## 2024-02-17 ENCOUNTER — Ambulatory Visit: Admitting: Family Medicine

## 2024-02-23 ENCOUNTER — Ambulatory Visit (INDEPENDENT_AMBULATORY_CARE_PROVIDER_SITE_OTHER)

## 2024-02-23 VITALS — Ht 70.0 in | Wt 205.0 lb

## 2024-02-23 DIAGNOSIS — Z0000 Encounter for general adult medical examination without abnormal findings: Secondary | ICD-10-CM | POA: Diagnosis not present

## 2024-02-23 DIAGNOSIS — E119 Type 2 diabetes mellitus without complications: Secondary | ICD-10-CM

## 2024-02-23 NOTE — Progress Notes (Addendum)
 Subjective:  Please attest and cosign this visit due to patients primary care provider not being in the office at the time the visit was completed.  (Pt of Eduardo Mackintosh, NP)   Eduardo Moon is a 78 y.o. who presents for a Medicare Wellness preventive visit.  As a reminder, Annual Wellness Visits don't include a physical exam, and some assessments may be limited, especially if this visit is performed virtually. We may recommend an in-person follow-up visit with your provider if needed.  Visit Complete: Virtual I connected with  Eduardo Moon on 02/23/24 by a audio enabled telemedicine application and verified that I am speaking with the correct person using two identifiers.  Patient Location: Home  Provider Location: Office/Clinic  I discussed the limitations of evaluation and management by telemedicine. The patient expressed understanding and agreed to proceed.  Vital Signs: Because this visit was a virtual/telehealth visit, some criteria may be missing or patient reported. Any vitals not documented were not able to be obtained and vitals that have been documented are patient reported.  VideoDeclined- This patient declined Librarian, academic. Therefore the visit was completed with audio only.  Persons Participating in Visit: Patient. Assisted by Daughter, Eduardo Moon  AWV Questionnaire: No: Patient Medicare AWV questionnaire was not completed prior to this visit.  Cardiac Risk Factors include: advanced age (>89men, >84 women);diabetes mellitus;dyslipidemia;hypertension;male gender     Objective:    Today's Vitals   02/23/24 1312  Weight: 205 lb (93 kg)  Height: 5' 10 (1.778 m)   Body mass index is 29.41 kg/m.     02/23/2024    1:12 PM 01/05/2023   10:51 PM 07/01/2022   10:00 AM 12/20/2021   10:22 AM 12/16/2021    5:05 PM 12/16/2021   11:35 AM 10/17/2020    7:11 PM  Advanced Directives  Does Patient Have a Medical Advance Directive? No Yes  Yes No No Yes Yes  Type of Loss adjuster, chartered of [employer] Power of [place];Living will  Does patient want to make changes to medical advance directive?       No - Patient declined  Copy of Healthcare Power of Attorney in Chart?   No - copy requested    No - copy requested  Would patient like information on creating a medical advance directive? Yes (MAU/Ambulatory/Procedural Areas - Information given)   No - Patient declined No - Patient declined  No - Patient declined    Current Medications (verified) Outpatient Encounter Medications as of 02/23/2024  Medication Sig   albuterol  (PROVENTIL ) (2.5 MG/3ML) 0.083% nebulizer solution Take 3 mLs (2.5 mg total) by nebulization every 6 (six) hours as needed for wheezing or shortness of breath.   albuterol  (VENTOLIN  HFA) 108 (90 Base) MCG/ACT inhaler TAKE 2 PUFFS BY MOUTH EVERY 6 HOURS AS NEEDED FOR WHEEZE OR SHORTNESS OF BREATH   allopurinol  (ZYLOPRIM ) 300 MG tablet TAKE 1 TABLET BY MOUTH EVERY DAY   amLODipine  (NORVASC ) 5 MG tablet TAKE 1 TABLET (5 MG TOTAL) BY MOUTH DAILY.   furosemide  (LASIX ) 20 MG tablet Take 1 tablet (20 mg total) by mouth daily.   lisinopril  (ZESTRIL ) 30 MG tablet TAKE 1 TABLET BY MOUTH EVERY DAY   metoprolol  succinate (TOPROL -XL) 100 MG 24 hr tablet TAKE 1 TABLET BY MOUTH DAILY. TAKE WITH OR IMMEDIATELY FOLLOWING A MEAL.   naproxen  (NAPROSYN ) 375 MG tablet TAKE 1 TABLET BY MOUTH TWICE A DAY AS NEEDED FOR  JOINT PAIN   omeprazole  (PRILOSEC) 20 MG capsule TAKE 1 CAPSULE BY MOUTH TWICE A DAY   psyllium (METAMUCIL SMOOTH TEXTURE) 58.6 % powder Take 1 packet by mouth 3 (three) times daily as needed (constipation).   rosuvastatin  (CRESTOR ) 5 MG tablet TAKE 1 TABLET BY MOUTH EVERY DAY IN THE EVENING   sertraline  (ZOLOFT ) 50 MG tablet TAKE 1 TABLET BY MOUTH EVERY DAY   tamsulosin  (FLOMAX ) 0.4 MG CAPS capsule Take 1 capsule (0.4 mg total) by mouth daily.   No  facility-administered encounter medications on file as of 02/23/2024.    Allergies (verified) Patient has no known allergies.   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   uses w/c for distance   Past Surgical History:  Procedure Laterality Date   ANKLE RECONSTRUCTION  1957   BILATERAL   ORCHIECTOMY  05/07/2012   Procedure: ORCHIECTOMY;  Surgeon: Alm GORMAN Fragmin, MD;  [facility];  Service: Urology;  Laterality: Right;   RADIOACTIVE PROSTATE SEED IMPLANTS  04-21-2011    DR FRAGMIN   PROSTATE CANCER   SCROTAL EXPLORATION  05/07/2012   Procedure: SCROTUM EXPLORATION;  Surgeon: Alm GORMAN Fragmin, MD;  Location: [facility];  Service: Urology;  Laterality: Right;  DRAINAGE OF ABSCESS  1 HR  UHC MEDICARE   Family History  Problem Relation Age of Onset   Cancer Mother    Cancer Father    Cancer Brother    Social History   Socioeconomic History   Marital status: Widowed    Spouse name: Not on file   Number of children: Not on file   Years of education: Not on file   Highest education level: Not on file  Occupational History   Not on file  Tobacco Use   Smoking status: Never   Smokeless tobacco: Never  Vaping Use   Vaping status: Never Used  Substance and Sexual Activity   Alcohol use: No   Drug use: No   Sexual activity: Not Currently  Other Topics Concern   Not on file  Social History Narrative   Not on file   Social Drivers of Health   Financial Resource Strain: Low Risk  (02/23/2024)   Overall Financial Resource Strain (CARDIA)    Difficulty of Paying Living Expenses: Not hard at all  Food Insecurity: No Food Insecurity (02/23/2024)   Hunger Vital  Sign    Worried About Running Out of Food in the Last Year: Never true    Ran Out of Food in the Last Year: Never true  Transportation Needs: No Transportation Needs (02/23/2024)   PRAPARE - Administrator, Civil Service (Medical): No    Lack of Transportation (Non-Medical): No  Physical Activity: Inactive (02/23/2024)   Exercise Vital Sign    Days of Exercise per Week: 0 days    Minutes of Exercise per Session: 0 min  Stress: No Stress Concern Present (02/23/2024)   [employer] of Occupational Health - Occupational Stress Questionnaire    Feeling of Stress: Not at all  Social Connections: Moderately Isolated (02/23/2024)   Social Connection and Isolation Panel    Frequency of Communication with  Friends and Family: More than three times a week    Frequency of Social Gatherings with Friends and Family: Three times a week    Attends Religious Services: 1 to 4 times per year    Active Member of Clubs or Organizations: No    Attends Banker Meetings: Never    Marital Status: Widowed    Tobacco Counseling Counseling given: No    Clinical Intake:  Pre-visit preparation completed: Yes  Pain : No/denies pain     BMI - recorded: 29.41 Nutritional Status: BMI 25 -29 Overweight Nutritional Risks: None Diabetes: Yes CBG done?: No Did pt. bring in CBG monitor from home?: No  Lab Results  Component Value Date   HGBA1C 5.9 10/21/2023   HGBA1C 5.6 04/08/2023   HGBA1C 5.8 06/23/2022     How often do you need to have someone help you when you read instructions, pamphlets, or other written materials from your doctor or pharmacy?: 1 - Never  Interpreter Needed?: No  Information entered by :: Verdie Saba, CMA   Activities of Daily Living     02/23/2024    1:16 PM  In your present state of health, do you have any difficulty performing the following activities:  Hearing? 0  Vision? 0  Difficulty concentrating or making decisions? 1  Comment Dtr  helps  Walking or climbing stairs? 1  Comment in a wheelchair  Dressing or bathing? 1  Comment Family assists  Doing errands, shopping? 1  Comment Dtr Therapist, music and eating ? Y  Comment Family assists  Using the Toilet? Y  Comment Family assists  In the past six months, have you accidently leaked urine? Y  Comment wears depends  Do you have problems with loss of bowel control? Y  Comment wears depends  Managing your Medications? Y  Comment Dtr manages  Managing your Finances? Y  Comment Dtr assists  Housekeeping or managing your Housekeeping? Y  Comment Family assists    Patient Care Team: Lendia Eduardo CROME, NP-C as PCP - General (Family Medicine) Kennyth Chew, MD as PCP - Electrophysiology (Cardiology)  I have updated your Care Teams any recent Medical Services you may have received from other providers in the past year.     Assessment:   This is a routine wellness examination for [facility]  Hearing/Vision screen Hearing Screening - Comments:: Denies hearing difficulties   Vision Screening - Comments:: Denies vision concerns - advised to schedule an appt w/an Optometrist   Goals Addressed               This Visit's Progress     Patient Stated (pt-stated)        Patient's dtr stating that their goal is to gain mobility       Depression Screen     02/23/2024    1:19 PM 10/21/2023    3:35 PM 09/23/2022   11:26 AM 07/01/2022   10:08 AM 01/24/2022    3:19 PM  PHQ 2/9 Scores  PHQ - 2 Score 0 0 0 0 2  PHQ- 9 Score 0   0 6    Fall Risk     02/23/2024    1:17 PM 10/21/2023    3:35 PM 09/23/2022   11:26 AM 07/01/2022   10:01 AM 01/24/2022    3:20 PM  Fall Risk   Falls in the past year? 0 0 0 0 0  Number falls in past yr: 0 0 0 0 0  Injury  with Fall? 0 0 0 0 0  Risk for fall due to : No Fall Risks Impaired balance/gait;Impaired mobility Impaired mobility Impaired balance/gait;Impaired mobility Impaired balance/gait;Impaired mobility  Follow up Falls  evaluation completed;Falls prevention discussed Falls evaluation completed Falls evaluation completed Falls evaluation completed;Education provided;Falls prevention discussed  Falls evaluation completed      Data saved with a previous flowsheet row definition    MEDICARE RISK AT HOME:  Medicare Risk at Home Any stairs in or around the home?: No If so, are there any without handrails?: No Home free of loose throw rugs in walkways, pet beds, electrical cords, etc?: Yes Adequate lighting in your home to reduce risk of falls?: Yes Life alert?: No Use of a cane, walker or w/c?: No Grab bars in the bathroom?: No Shower chair or bench in shower?: No Elevated toilet seat or a handicapped toilet?: No  TIMED UP AND GO:  Was the test performed?  No  Cognitive Function: Declined/Normal: No cognitive concerns noted by patient or family. Patient alert, oriented, able to answer questions appropriately and recall recent events. No signs of memory loss or confusion.    02/23/2024    1:20 PM  MMSE - Mini Mental State Exam  Not completed: Refused        07/01/2022   10:02 AM  6CIT Screen  What Year? 0 points  What month? 0 points  What time? 0 points  Count back from 20 0 points  Months in reverse 4 points  Repeat phrase 10 points  Total Score 14 points    Immunizations  There is no immunization history on file for this patient.  Screening Tests Health Maintenance  Topic Date Due   COVID-19 Vaccine (1) Never done   FOOT EXAM  Never done   OPHTHALMOLOGY EXAM  Never done   Diabetic kidney evaluation - Urine ACR  Never done   DTaP/Tdap/Td (1 - Tdap) Never done   Pneumococcal Vaccine: 50+ Years (1 of 2 - PCV) Never done   Zoster Vaccines- Shingrix (1 of 2) Never done   INFLUENZA VACCINE  02/26/2024   HEMOGLOBIN A1C  04/22/2024   Diabetic kidney evaluation - eGFR measurement  10/20/2024   Medicare Annual Wellness (AWV)  02/22/2025   Hepatitis B Vaccines  Aged Out   HPV VACCINES   Aged Out   Meningococcal B Vaccine  Aged Out   Colonoscopy  Discontinued   Hepatitis C Screening  Discontinued    Health Maintenance  Health Maintenance Due  Topic Date Due   COVID-19 Vaccine (1) Never done   FOOT EXAM  Never done   OPHTHALMOLOGY EXAM  Never done   Diabetic kidney evaluation - Urine ACR  Never done   DTaP/Tdap/Td (1 - Tdap) Never done   Pneumococcal Vaccine: 50+ Years (1 of 2 - PCV) Never done   Zoster Vaccines- Shingrix (1 of 2) Never done   Health Maintenance Items Addressed:  Referral to [facility] to Dr Janit for a Diabetic Eye Exam; Ordered a Diabetic Kidney Urine ACR  I have recommended that this patient have a immunization for Tdap, Pneumonia, and Shingles but he declines at this time. I have discussed the risks and benefits of this procedure with him. The patient verbalizes understanding.  Additional Screening:  Vision Screening: Recommended annual ophthalmology exams for early detection of glaucoma and other disorders of the eye. Would you like a referral to an eye doctor? No  Advised the Dtr to contact [facility] to  schedule an eye exam appt for the pt.  Pt has not had an eye exam >39yrs.  Dental Screening: Recommended annual dental exams for proper oral hygiene  Community Resource Referral / Chronic Care Management: CRR required this visit?  No   CCM required this visit?  No   Plan:    I have personally reviewed and noted the following in the patient's chart:   Medical and social history Use of alcohol, tobacco or illicit drugs  Current medications and supplements including opioid prescriptions. Patient is not currently taking opioid prescriptions. Functional ability and status Nutritional status Physical activity Advanced directives List of other physicians Hospitalizations, surgeries, and ER visits in previous 12 months Vitals Screenings to include cognitive, depression, and falls Referrals and appointments  In addition, I  have reviewed and discussed with patient certain preventive protocols, quality metrics, and best practice recommendations. A written personalized care plan for preventive services as well as general preventive health recommendations were provided to patient.   Verdie CHRISTELLA Saba, CMA   02/23/2024   After Visit Summary: (MyChart) Due to this being a telephonic visit, the after visit summary with patients personalized plan was offered to patient via MyChart   Notes: Nothing significant to report at this time.

## 2024-02-23 NOTE — Patient Instructions (Signed)
 Mr. Eduardo Moon , Thank you for taking time out of your busy schedule to complete your Annual Wellness Visit with me. I enjoyed our conversation and look forward to speaking with you again next year. I, as well as your care team,  appreciate your ongoing commitment to your health goals. Please review the following plan we discussed and let me know if I can assist you in the future. Your Game plan/ To Do List    Referrals: If you haven't heard from the office you've been referred to, please reach out to them at the phone provided.  Referral to [facility] for a Diabetic Foot exam; Ordered a Diabetic Kidney Urine ACR (lab) Follow up Visits: Next Medicare AWV with our clinical staff: 02/27/2025   Have you seen your provider in the last 6 months (3 months if uncontrolled diabetes)? No Next Office Visit with your provider: 03/11/2024  Clinician Recommendations:  Aim for 30 minutes of exercise or brisk walking, 6-8 glasses of water , and 5 servings of fruits and vegetables each day. Educated and advised on getting the Shingles, Pneumonia, and Tdap vaccines in 2025.      This is a list of the screening recommended for you and due dates:  Health Maintenance  Topic Date Due   COVID-19 Vaccine (1) Never done   Complete foot exam   Never done   Eye exam for diabetics  Never done   Yearly kidney health urinalysis for diabetes  Never done   DTaP/Tdap/Td vaccine (1 - Tdap) Never done   Pneumococcal Vaccine for age over 61 (1 of 2 - PCV) Never done   Zoster (Shingles) Vaccine (1 of 2) Never done   Flu Shot  02/26/2024   Hemoglobin A1C  04/22/2024   Yearly kidney function blood test for diabetes  10/20/2024   Medicare Annual Wellness Visit  02/22/2025   Hepatitis B Vaccine  Aged Out   HPV Vaccine  Aged Out   Meningitis B Vaccine  Aged Out   Colon Cancer Screening  Discontinued   Hepatitis C Screening  Discontinued    Advanced directives: (Provided) Advance directive discussed with you today. I have  provided a copy for you to complete at home and have notarized. Once this is complete, please bring a copy in to our office so we can scan it into your chart.  Advance Care Planning is important because it:  [x]  Makes sure you receive the medical care that is consistent with your values, goals, and preferences  [x]  It provides guidance to your family and loved ones and reduces their decisional burden about whether or not they are making the right decisions based on your wishes.  Follow the link provided in your after visit summary or read over the paperwork we have mailed to you to help you started getting your Advance Directives in place. If you need assistance in completing these, please reach out to us  so that we can help you!

## 2024-03-11 ENCOUNTER — Emergency Department (HOSPITAL_BASED_OUTPATIENT_CLINIC_OR_DEPARTMENT_OTHER)
Admission: EM | Admit: 2024-03-11 | Discharge: 2024-03-11 | Disposition: A | Discharge status: Home / Self Care | Source: Ambulatory Visit | Attending: Emergency Medicine | Admitting: Emergency Medicine

## 2024-03-11 ENCOUNTER — Encounter: Payer: Self-pay | Admitting: Family Medicine

## 2024-03-11 ENCOUNTER — Ambulatory Visit (INDEPENDENT_AMBULATORY_CARE_PROVIDER_SITE_OTHER): Admitting: Family Medicine

## 2024-03-11 ENCOUNTER — Emergency Department (HOSPITAL_BASED_OUTPATIENT_CLINIC_OR_DEPARTMENT_OTHER)

## 2024-03-11 ENCOUNTER — Other Ambulatory Visit: Payer: Self-pay

## 2024-03-11 ENCOUNTER — Encounter (HOSPITAL_BASED_OUTPATIENT_CLINIC_OR_DEPARTMENT_OTHER): Payer: Self-pay

## 2024-03-11 VITALS — BP 110/62 | HR 45 | Temp 97.6°F | Ht 70.0 in

## 2024-03-11 DIAGNOSIS — E119 Type 2 diabetes mellitus without complications: Secondary | ICD-10-CM

## 2024-03-11 DIAGNOSIS — E538 Deficiency of other specified B group vitamins: Secondary | ICD-10-CM

## 2024-03-11 DIAGNOSIS — R4182 Altered mental status, unspecified: Secondary | ICD-10-CM | POA: Diagnosis not present

## 2024-03-11 DIAGNOSIS — R443 Hallucinations, unspecified: Secondary | ICD-10-CM

## 2024-03-11 DIAGNOSIS — N390 Urinary tract infection, site not specified: Secondary | ICD-10-CM | POA: Diagnosis not present

## 2024-03-11 DIAGNOSIS — R001 Bradycardia, unspecified: Secondary | ICD-10-CM | POA: Diagnosis not present

## 2024-03-11 DIAGNOSIS — Z8546 Personal history of malignant neoplasm of prostate: Secondary | ICD-10-CM | POA: Diagnosis not present

## 2024-03-11 DIAGNOSIS — A419 Sepsis, unspecified organism: Secondary | ICD-10-CM | POA: Diagnosis not present

## 2024-03-11 DIAGNOSIS — I700 Atherosclerosis of aorta: Secondary | ICD-10-CM | POA: Diagnosis not present

## 2024-03-11 DIAGNOSIS — R531 Weakness: Secondary | ICD-10-CM | POA: Diagnosis not present

## 2024-03-11 DIAGNOSIS — R7303 Prediabetes: Secondary | ICD-10-CM

## 2024-03-11 DIAGNOSIS — E039 Hypothyroidism, unspecified: Secondary | ICD-10-CM | POA: Insufficient documentation

## 2024-03-11 DIAGNOSIS — Z993 Dependence on wheelchair: Secondary | ICD-10-CM | POA: Diagnosis not present

## 2024-03-11 DIAGNOSIS — N281 Cyst of kidney, acquired: Secondary | ICD-10-CM | POA: Diagnosis not present

## 2024-03-11 DIAGNOSIS — I6782 Cerebral ischemia: Secondary | ICD-10-CM | POA: Diagnosis not present

## 2024-03-11 DIAGNOSIS — I10 Essential (primary) hypertension: Secondary | ICD-10-CM | POA: Insufficient documentation

## 2024-03-11 DIAGNOSIS — K5730 Diverticulosis of large intestine without perforation or abscess without bleeding: Secondary | ICD-10-CM | POA: Diagnosis not present

## 2024-03-11 DIAGNOSIS — Z79899 Other long term (current) drug therapy: Secondary | ICD-10-CM | POA: Diagnosis not present

## 2024-03-11 DIAGNOSIS — Z8619 Personal history of other infectious and parasitic diseases: Secondary | ICD-10-CM

## 2024-03-11 LAB — COMPREHENSIVE METABOLIC PANEL WITH GFR
ALT: 19 U/L (ref 0–44)
AST: 9 U/L — ABNORMAL LOW (ref 15–41)
Albumin: 3.5 g/dL (ref 3.5–5.0)
Alkaline Phosphatase: 114 U/L (ref 38–126)
Anion gap: 14 (ref 5–15)
BUN: 21 mg/dL (ref 8–23)
CO2: 22 mmol/L (ref 22–32)
Calcium: 9.6 mg/dL (ref 8.9–10.3)
Chloride: 105 mmol/L (ref 98–111)
Creatinine, Ser: 1.33 mg/dL — ABNORMAL HIGH (ref 0.61–1.24)
GFR, Estimated: 54 mL/min — ABNORMAL LOW (ref >60)
Glucose, Bld: 125 mg/dL — ABNORMAL HIGH (ref 70–99)
Potassium: 4.2 mmol/L (ref 3.5–5.1)
Sodium: 141 mmol/L (ref 135–145)
Total Bilirubin: 0.3 mg/dL (ref 0.0–1.2)
Total Protein: 7.3 g/dL (ref 6.5–8.1)

## 2024-03-11 LAB — URINALYSIS, W/ REFLEX TO CULTURE (INFECTION SUSPECTED)
Bilirubin Urine: NEGATIVE
Glucose, UA: NEGATIVE mg/dL
Hgb urine dipstick: NEGATIVE
Ketones, ur: NEGATIVE mg/dL
Nitrite: NEGATIVE
Specific Gravity, Urine: 1.024 (ref 1.005–1.030)
pH: 5.5 (ref 5.0–8.0)

## 2024-03-11 LAB — CBC WITH DIFFERENTIAL/PLATELET
Abs Immature Granulocytes: 0.08 K/uL — ABNORMAL HIGH (ref 0.00–0.07)
Basophils Absolute: 0.1 K/uL (ref 0.0–0.1)
Basophils Relative: 1 %
Eosinophils Absolute: 0.2 K/uL (ref 0.0–0.5)
Eosinophils Relative: 2 %
HCT: 34.2 % — ABNORMAL LOW (ref 39.0–52.0)
Hemoglobin: 11 g/dL — ABNORMAL LOW (ref 13.0–17.0)
Immature Granulocytes: 1 %
Lymphocytes Relative: 15 %
Lymphs Abs: 1.9 K/uL (ref 0.7–4.0)
MCH: 30.8 pg (ref 26.0–34.0)
MCHC: 32.2 g/dL (ref 30.0–36.0)
MCV: 95.8 fL (ref 80.0–100.0)
Monocytes Absolute: 0.8 K/uL (ref 0.1–1.0)
Monocytes Relative: 6 %
Neutro Abs: 9.5 K/uL — ABNORMAL HIGH (ref 1.7–7.7)
Neutrophils Relative %: 75 %
Platelets: 319 K/uL (ref 150–400)
RBC: 3.57 MIL/uL — ABNORMAL LOW (ref 4.22–5.81)
RDW: 15.2 % (ref 11.5–15.5)
WBC: 12.4 K/uL — ABNORMAL HIGH (ref 4.0–10.5)
nRBC: 0 % (ref 0.0–0.2)

## 2024-03-11 LAB — POC URINALSYSI DIPSTICK (AUTOMATED)
Bilirubin, UA: NEGATIVE
Blood, UA: NEGATIVE
Glucose, UA: NEGATIVE
Ketones, UA: NEGATIVE
Leukocytes, UA: NEGATIVE
Nitrite, UA: NEGATIVE
Protein, UA: NEGATIVE
Spec Grav, UA: 1.015 (ref 1.010–1.025)
Urobilinogen, UA: 0.2 U/dL
pH, UA: 6 (ref 5.0–8.0)

## 2024-03-11 LAB — CBG MONITORING, ED: Glucose-Capillary: 117 mg/dL — ABNORMAL HIGH (ref 70–99)

## 2024-03-11 LAB — LACTIC ACID, PLASMA
Lactic Acid, Venous: 1.3 mmol/L (ref 0.5–1.9)
Lactic Acid, Venous: 0.8 mmol/L (ref 0.5–1.9)

## 2024-03-11 MED ORDER — SODIUM CHLORIDE 0.9 % IV BOLUS
500.0000 mL | Freq: Once | INTRAVENOUS | Status: AC
  Administered 2024-03-11: 500 mL via INTRAVENOUS

## 2024-03-11 MED ORDER — IOHEXOL 300 MG/ML  SOLN
80.0000 mL | Freq: Once | INTRAMUSCULAR | Status: AC | PRN
  Administered 2024-03-11: 80 mL via INTRAVENOUS

## 2024-03-11 MED ORDER — SODIUM CHLORIDE 0.9 % IV SOLN: INTRAVENOUS | Status: DC

## 2024-03-11 NOTE — ED Triage Notes (Signed)
 Two weeks progressively more confused. Visual and auditory hallucinations. Suspected UTI due to previous infections and sepsis. No pain reported today. Pt is aware something is not right. Denies CP, SOB.

## 2024-03-11 NOTE — Progress Notes (Signed)
 Subjective:     Patient ID: Eduardo Moon, male    DOB: May 20, 1946, 78 y.o.   MRN: [id number]  Chief Complaint  Patient presents with   Medical Management of Chronic Issues    3 month f/u, wants to check urine for UTI.     HPI  Discussed the use of AI scribe software for clinical note transcription with the patient, who gave verbal consent to proceed.  History of Present Illness Eduardo Moon is an 78 year old male who presents with altered mental status and hallucinations.  Altered mental status and hallucinations - Altered mental status and visual hallucinations for the past 2-3 weeks - Visual hallucinations consist of seeing a lady and three men, primarily at night around 3-4 AM - Hallucinations resolve when a light is turned on - No auditory hallucinations - Sleep talking and acting out of character, including throwing the remote in his sleep - Oriented to year and place   Urinary symptoms - History of urinary tract infections, including a previous episode with sepsis - Strong odor in urine, as observed by his daughter - No hematuria - No dysuria or pain with urination  Psychiatric medication use - On sertraline  for depression - Sertraline  was discontinued for one week due to concern for medication-induced hallucinations, with no change in symptoms - Sertraline  was subsequently resumed per daughter  Respiratory and upper respiratory symptoms - Sinus cold for the past couple of weeks - Wheezing in the chest without shortness of breath or cough  Mobility and functional status - Wheelchair-bound - Requires assistance for transfers  Right foot swelling and trauma - Swelling in the right foot following trauma (bruised against a bed) - No open wounds - No weakness, numbness, tingling, or stroke-like symptoms      Health Maintenance Due  Topic Date Due   COVID-19 Vaccine (1) Never done   FOOT EXAM  Never done   OPHTHALMOLOGY EXAM  Never done   Diabetic  kidney evaluation - Urine ACR  Never done   DTaP/Tdap/Td (1 - Tdap) Never done   Pneumococcal Vaccine: 50+ Years (1 of 2 - PCV) Never done   Zoster Vaccines- Shingrix (1 of 2) Never done   INFLUENZA VACCINE  02/26/2024    Past Medical History:  Diagnosis Date   Arthritis    History of prostate cancer s/p radiactive seed implants  03-24-2011   Hypertension    Mild acid reflux watches diet   Polio age 30   Polio    Scrotal abscess    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   Weakness 05/2020   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   uses w/c for distance    Past Surgical History:  Procedure Laterality Date   ANKLE RECONSTRUCTION  1957   BILATERAL   ORCHIECTOMY  05/07/2012   Procedure: ORCHIECTOMY;  Surgeon: Alm GORMAN Fragmin, MD;  Location: [facility];  Service: Urology;  Laterality: Right;   RADIOACTIVE PROSTATE SEED IMPLANTS  04-21-2011    DR FRAGMIN   PROSTATE CANCER   SCROTAL EXPLORATION  05/07/2012   Procedure: SCROTUM EXPLORATION;  Surgeon: Alm GORMAN Fragmin, MD;  Location: [facility];  Service: Urology;  Laterality: Right;  DRAINAGE OF ABSCESS  1 HR  UHC MEDICARE    Family History  Problem Relation Age of Onset   Cancer Mother    Cancer Father    Cancer Brother     Social History   Socioeconomic History   Marital status: Widowed  Spouse name: Not on file   Number of children: Not on file   Years of education: Not on file   Highest education level: Not on file  Occupational History   Not on file  Tobacco Use   Smoking status: Never   Smokeless tobacco: Never  Vaping Use   Vaping status: Never Used  Substance and Sexual Activity   Alcohol use: No   Drug use: No   Sexual activity: Not Currently  Other Topics Concern   Not on file  Social History Narrative   Not on file   Social Drivers of Health   Financial Resource Strain: Low Risk  (02/23/2024)   Overall Financial Resource Strain (CARDIA)    Difficulty of Paying Living  Expenses: Not hard at all  Food Insecurity: No Food Insecurity (02/23/2024)   Hunger Vital Sign    Worried About Running Out of Food in the Last Year: Never true    Ran Out of Food in the Last Year: Never true  Transportation Needs: No Transportation Needs (02/23/2024)   PRAPARE - Administrator, Civil Service (Medical): No    Lack of Transportation (Non-Medical): No  Physical Activity: Inactive (02/23/2024)   Exercise Vital Sign    Days of Exercise per Week: 0 days    Minutes of Exercise per Session: 0 min  Stress: No Stress Concern Present (02/23/2024)   [employer] of Occupational Health - Occupational Stress Questionnaire    Feeling of Stress: Not at all  Social Connections: Moderately Isolated (02/23/2024)   Social Connection and Isolation Panel    Frequency of Communication with Friends and Family: More than three times a week    Frequency of Social Gatherings with Friends and Family: Three times a week    Attends Religious Services: 1 to 4 times per year    Active Member of Clubs or Organizations: No    Attends Banker Meetings: Never    Marital Status: Widowed  Intimate Partner Violence: Not At Risk (02/23/2024)   Humiliation, Afraid, Rape, and Kick questionnaire    Fear of Current or Ex-Partner: No    Emotionally Abused: No    Physically Abused: No    Sexually Abused: No    Outpatient Medications Prior to Visit  Medication Sig Dispense Refill   albuterol  (PROVENTIL ) (2.5 MG/3ML) 0.083% nebulizer solution Take 3 mLs (2.5 mg total) by nebulization every 6 (six) hours as needed for wheezing or shortness of breath. 75 mL 0   albuterol  (VENTOLIN  HFA) 108 (90 Base) MCG/ACT inhaler TAKE 2 PUFFS BY MOUTH EVERY 6 HOURS AS NEEDED FOR WHEEZE OR SHORTNESS OF BREATH 18 g 1   allopurinol  (ZYLOPRIM ) 300 MG tablet TAKE 1 TABLET BY MOUTH EVERY DAY 90 tablet 1   amLODipine  (NORVASC ) 5 MG tablet TAKE 1 TABLET (5 MG TOTAL) BY MOUTH DAILY. 90 tablet 1   furosemide   (LASIX ) 20 MG tablet Take 1 tablet (20 mg total) by mouth daily. 5 tablet 0   lisinopril  (ZESTRIL ) 30 MG tablet TAKE 1 TABLET BY MOUTH EVERY DAY 90 tablet 1   metoprolol  succinate (TOPROL -XL) 100 MG 24 hr tablet TAKE 1 TABLET BY MOUTH DAILY. TAKE WITH OR IMMEDIATELY FOLLOWING A MEAL. 90 tablet 1   naproxen  (NAPROSYN ) 375 MG tablet TAKE 1 TABLET BY MOUTH TWICE A DAY AS NEEDED FOR JOINT PAIN 60 tablet 2   omeprazole  (PRILOSEC) 20 MG capsule TAKE 1 CAPSULE BY MOUTH TWICE A DAY 180 capsule 1   psyllium (METAMUCIL  SMOOTH TEXTURE) 58.6 % powder Take 1 packet by mouth 3 (three) times daily as needed (constipation). 283 g 0   rosuvastatin  (CRESTOR ) 5 MG tablet TAKE 1 TABLET BY MOUTH EVERY DAY IN THE EVENING 90 tablet 1   sertraline  (ZOLOFT ) 50 MG tablet TAKE 1 TABLET BY MOUTH EVERY DAY 90 tablet 1   tamsulosin  (FLOMAX ) 0.4 MG CAPS capsule Take 1 capsule (0.4 mg total) by mouth daily. 30 capsule 0   No facility-administered medications prior to visit.    No Known Allergies  Review of Systems  Constitutional:  Negative for chills, fever and malaise/fatigue.  Eyes:  Negative for blurred vision and double vision.  Respiratory:  Negative for cough and shortness of breath.   Cardiovascular:  Negative for chest pain, palpitations and leg swelling.  Gastrointestinal:  Negative for abdominal pain, constipation, diarrhea, nausea and vomiting.  Genitourinary:  Negative for dysuria, frequency and urgency.  Musculoskeletal:  Negative for falls.  Skin:  Negative for rash.  Neurological:  Negative for dizziness, loss of consciousness and headaches.  Psychiatric/Behavioral:  Positive for hallucinations.        Objective:    Physical Exam Constitutional:      General: He is not in acute distress.    Appearance: He is obese. He is not ill-appearing.  HENT:     Mouth/Throat:     Mouth: Mucous membranes are moist.     Pharynx: Oropharynx is clear.  Eyes:     Extraocular Movements: Extraocular movements  intact.     Conjunctiva/sclera: Conjunctivae normal.  Cardiovascular:     Rate and Rhythm: Bradycardia present.  Pulmonary:     Effort: Pulmonary effort is normal.     Breath sounds: Rhonchi present.     Comments: RLL Musculoskeletal:     Cervical back: Normal range of motion and neck supple.  Skin:    General: Skin is warm and dry.  Neurological:     Mental Status: He is alert.     Cranial Nerves: No facial asymmetry.     Comments: Oriented x 2 Chronic leg weakness, wheelchair dependent   Psychiatric:        Mood and Affect: Mood normal.        Behavior: Behavior normal.        Thought Content: Thought content normal.      BP 110/62   Pulse (!) 45   Temp 97.6 F (36.4 C) (Temporal)   Ht 5' 10 (1.778 m)   SpO2 97%   BMI 29.41 kg/m  Wt Readings from Last 3 Encounters:  02/23/24 205 lb (93 kg)  10/21/23 205 lb (93 kg)  10/02/23 205 lb 0.4 oz (93 kg)       Assessment & Plan:   Problem List Items Addressed This Visit     Bradycardia   DM2 (diabetes mellitus, type 2) (HCC)   Hx of sepsis   Recurrent UTI   Relevant Orders   POCT Urinalysis Dipstick (Automated) (Completed)   Other Visit Diagnoses       Altered mental status, unspecified altered mental status type    -  Primary   Relevant Orders   POCT Urinalysis Dipstick (Automated) (Completed)     B12 deficiency         Prediabetes         Wheelchair dependent         Hallucinations           Assessment and Plan Assessment & Plan Altered mental status with  visual hallucinations Experiencing altered mental status with visual hallucinations for the past two to three weeks, seeing a lady and three men primarily at night, along with other hallucintation. no auditory hallucinations. Similar symptoms occurred during a previous UTI, but current urine analysis shows no evidence of UTI or other infection. Urine brought in by patient and collected around 1 pm today.  Differential diagnosis includes metabolic  disturbances, medication side effects, or neurological issues. Acute metabolic encephalopathy is a concern for possible recurrence, necessitating further evaluation for potential metabolic imbalances or neurological issues. - Refer to emergency department for comprehensive workup including blood tests, possible brain imaging, and evaluation for infection.   Bradycardia Pulse in the 40s, lower than previously recorded, with no dizziness, chest pain, or shortness of breath. Low pulse rate requires further evaluation to determine underlying cause and assess for potential cardiac issues. - referral to ED and daughter will drive him for further evaluation.     I am having Eduardo Moon maintain his tamsulosin , furosemide , naproxen , albuterol , Metamucil Smooth Texture, lisinopril , allopurinol , rosuvastatin , metoprolol  succinate, sertraline , amLODipine , albuterol , and omeprazole .  No orders of the defined types were placed in this encounter.

## 2024-03-11 NOTE — ED Provider Notes (Addendum)
 [place] EMERGENCY DEPARTMENT AT [facility] Provider Note   CSN: [id number] Arrival date & time: 03/11/24  1728     Patient presents with: Altered Mental Status   Eduardo Moon is a 78 y.o. male.   Patient with 2-week history of visual and auditory hallucinations.  They originally concerned about a urinary tract infection because this happened before.  Patient was seen by primary care doctor today.  Urine there was normal.  Patient denies any complaints.  Things have not been getting worse.  Patient is fairly alert currently.  He does not want to be admitted.  Patient has been bedbound for a long time.  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  Patient is never used tobacco products.  Last seen in March for lower urinary tract infection.  Was discharged from the emergency department.  Patient denies fever nausea vomiting diarrhea chest pain abdominal pain headache upper respiratory symptoms.       Prior to Admission medications   Medication Sig Start Date End Date Taking? Authorizing Provider  albuterol  (PROVENTIL ) (2.5 MG/3ML) 0.083% nebulizer solution Take 3 mLs (2.5 mg total) by nebulization every 6 (six) hours as needed for wheezing or shortness of breath. 08/27/23   Henson, Vickie L, NP-C  albuterol  (VENTOLIN  HFA) 108 (90 Base) MCG/ACT inhaler TAKE 2 PUFFS BY MOUTH EVERY 6 HOURS AS NEEDED FOR WHEEZE OR SHORTNESS OF BREATH 01/25/24   Henson, Vickie L, NP-C  allopurinol  (ZYLOPRIM ) 300 MG tablet TAKE 1 TABLET BY MOUTH EVERY DAY 10/26/23   Henson, Vickie L, NP-C  amLODipine  (NORVASC ) 5 MG tablet TAKE 1 TABLET (5 MG TOTAL) BY MOUTH DAILY. 12/28/23   Henson, Vickie L, NP-C  furosemide  (LASIX ) 20 MG tablet Take 1 tablet (20 mg total) by mouth daily. 07/30/23   Henson, Vickie L, NP-C   lisinopril  (ZESTRIL ) 30 MG tablet TAKE 1 TABLET BY MOUTH EVERY DAY 10/26/23   Henson, Vickie L, NP-C  metoprolol  succinate (TOPROL -XL) 100 MG 24 hr tablet TAKE 1 TABLET BY MOUTH DAILY. TAKE WITH OR IMMEDIATELY FOLLOWING A MEAL. 12/28/23   Henson, Vickie L, NP-C  naproxen  (NAPROSYN ) 375 MG tablet TAKE 1 TABLET BY MOUTH TWICE A DAY AS NEEDED FOR JOINT PAIN 08/26/23   Henson, Vickie L, NP-C  omeprazole  (PRILOSEC) 20 MG capsule TAKE 1 CAPSULE BY MOUTH TWICE A DAY 02/01/24   Henson, Vickie L, NP-C  psyllium (METAMUCIL SMOOTH TEXTURE) 58.6 % powder Take 1 packet by mouth 3 (three) times daily as needed (constipation). 10/02/23   Patsey Lot, MD  rosuvastatin  (CRESTOR ) 5 MG tablet TAKE 1 TABLET BY MOUTH EVERY DAY IN THE EVENING 12/22/23   Henson, Vickie L, NP-C  sertraline  (ZOLOFT ) 50 MG tablet TAKE 1 TABLET BY MOUTH EVERY DAY 12/28/23   Henson, Vickie L, NP-C  tamsulosin  (FLOMAX ) 0.4 MG CAPS capsule Take 1 capsule (0.4 mg total) by mouth daily. 12/20/21   Jerri Keys, MD    Allergies: Patient has no known allergies.    Review of Systems  Constitutional:  Negative for chills and fever.  HENT:  Negative for ear pain and sore throat.   Eyes:  Negative for pain and visual disturbance.  Respiratory:  Negative for cough and shortness of breath.   Cardiovascular:  Negative for chest pain and palpitations.  Gastrointestinal:  Negative for abdominal pain and  vomiting.  Genitourinary:  Negative for dysuria and hematuria.  Musculoskeletal:  Negative for arthralgias and back pain.  Skin:  Negative for color change and rash.  Neurological:  Negative for seizures and syncope.  Psychiatric/Behavioral:  Positive for hallucinations.   All other systems reviewed and are negative.   Updated Vital Signs BP 105/80   Pulse 74   Temp 98.4 F (36.9 C)   Resp 18   Ht 1.702 m (5' 7)   Wt 91 kg   SpO2 99%   BMI 31.43 kg/m   Physical Exam Vitals and nursing note reviewed.  Constitutional:      General: He is not  in acute distress.    Appearance: Normal appearance. He is well-developed. He is not ill-appearing.  HENT:     Head: Normocephalic and atraumatic.  Eyes:     Extraocular Movements: Extraocular movements intact.     Conjunctiva/sclera: Conjunctivae normal.     Pupils: Pupils are equal, round, and reactive to light.  Cardiovascular:     Rate and Rhythm: Normal rate and regular rhythm.     Heart sounds: No murmur heard. Pulmonary:     Effort: Pulmonary effort is normal. No respiratory distress.     Breath sounds: Normal breath sounds.  Abdominal:     Palpations: Abdomen is soft.     Tenderness: There is no abdominal tenderness.  Musculoskeletal:        General: No swelling.     Cervical back: Neck supple. No rigidity.  Skin:    General: Skin is warm and dry.     Capillary Refill: Capillary refill takes less than 2 seconds.  Neurological:     General: No focal deficit present.     Mental Status: He is alert and oriented to person, place, and time.     Comments: Significant weakness in both lower extremities.  Deformity to the left lower extremity secondary to polio.  Good movement of upper extremities.  Patient quite alert currently.  Does not want to be admitted.  Psychiatric:        Mood and Affect: Mood normal.     (all labs ordered are listed, but only abnormal results are displayed) Labs Reviewed  COMPREHENSIVE METABOLIC PANEL WITH GFR - Abnormal; Notable for the following components:      Result Value   Glucose, Bld 125 (*)    Creatinine, Ser 1.33 (*)    AST 9 (*)    GFR, Estimated 54 (*)    All other components within normal limits  CBC WITH DIFFERENTIAL/PLATELET - Abnormal; Notable for the following components:   WBC 12.4 (*)    RBC 3.57 (*)    Hemoglobin 11.0 (*)    HCT 34.2 (*)    Neutro Abs 9.5 (*)    Abs Immature Granulocytes 0.08 (*)    All other components within normal limits  URINALYSIS, W/ REFLEX TO CULTURE (INFECTION SUSPECTED) - Abnormal; Notable for  the following components:   Protein, ur TRACE (*)    Leukocytes,Ua MODERATE (*)    Bacteria, UA RARE (*)    All other components within normal limits  CBG MONITORING, ED - Abnormal; Notable for the following components:   Glucose-Capillary 117 (*)    All other components within normal limits  URINE CULTURE  LACTIC ACID, PLASMA  LACTIC ACID, PLASMA    EKG: EKG Interpretation Date/Time:  Friday March 11 2024 17:55:17 EDT Ventricular Rate:  74 PR Interval:  196 QRS Duration:  80 QT Interval:  394 QTC Calculation: 437 R Axis:   36  Text Interpretation: Sinus rhythm with frequent Premature ventricular complexes Possible Inferior infarct , age undetermined Abnormal ECG When compared with ECG of 20-Jul-2023 16:10, Premature ventricular complexes are now Present Borderline criteria for Inferior infarct are now Present Confirmed by Arland Usery [phone]) on 03/11/2024 6:04:28 PM  Radiology: CT CHEST ABDOMEN PELVIS W CONTRAST Result Date: 03/11/2024 CLINICAL DATA:  Sepsis EXAM: CT CHEST, ABDOMEN, AND PELVIS WITH CONTRAST TECHNIQUE: Multidetector CT imaging of the chest, abdomen and pelvis was performed following the standard protocol during bolus administration of intravenous contrast. RADIATION DOSE REDUCTION: This exam was performed according to the departmental dose-optimization program which includes automated exposure control, adjustment of the mA and/or kV according to patient size and/or use of iterative reconstruction technique. CONTRAST:  80mL OMNIPAQUE  IOHEXOL  300 MG/ML  SOLN COMPARISON:  10/17/2020 FINDINGS: CT CHEST FINDINGS Cardiovascular: Heart is normal size. Aorta is normal caliber. Coronary artery and aortic atherosclerosis. Mediastinum/Nodes: No mediastinal, hilar, or axillary adenopathy. Trachea and esophagus are unremarkable. Thyroid  unremarkable. Lungs/Pleura: No confluent airspace opacities or effusions. Mild airway thickening. Musculoskeletal: Chest wall soft tissues are  unremarkable. No acute bony abnormality. CT ABDOMEN PELVIS FINDINGS Hepatobiliary: No focal hepatic abnormality. Gallbladder unremarkable. Pancreas: No focal abnormality or ductal dilatation. Spleen: No focal abnormality.  Normal size. Adrenals/Urinary Tract: Adrenal glands normal. Left upper pole renal cyst measures 2.9 cm and is stable since prior study. Right lower pole cyst measures 1.3 cm. No follow-up imaging recommended. No stones or hydronephrosis. Urinary bladder decompressed, unremarkable. Stomach/Bowel: Left colonic diverticulosis. No active diverticulitis. Stomach and small bowel decompressed. No bowel obstruction or inflammatory process. Moderate stool burden in the rectum. Vascular/Lymphatic: Aortic atherosclerosis. No evidence of aneurysm or adenopathy. Reproductive: Radiation seeds in the region the prostate. Other: No free fluid or free air. Musculoskeletal: Marked wasting and atrophy of the left psoas/iliopsoas and upper thigh muscles and gluteus muscles. This is unchanged since prior study. No acute bony abnormality. IMPRESSION: No acute findings in the chest, abdomen or pelvis. Coronary artery disease, aortic atherosclerosis. Left colonic diverticulosis.  No active diverticulitis. Moderate stool burden in the rectum. Electronically Signed   By: Franky Crease M.D.   On: 03/11/2024 20:36   CT Head Wo Contrast Result Date: 03/11/2024 EXAM: CT HEAD WITHOUT CONTRAST 03/11/2024 07:42:45 PM TECHNIQUE: CT of the head was performed without the administration of intravenous contrast. Automated exposure control, iterative reconstruction, and/or weight based adjustment of the mA/kV was utilized to reduce the radiation dose to as low as reasonably achievable. COMPARISON: None available. CLINICAL HISTORY: Mental status change, unknown cause. FINDINGS: BRAIN AND VENTRICLES: No acute hemorrhage. Gray-white differentiation is preserved. No hydrocephalus. No extra-axial collection. No mass effect or midline  shift. Mild chronic ischemic changes of the white matter. ORBITS: No acute abnormality. SINUSES: No acute abnormality. SOFT TISSUES AND SKULL: No acute soft tissue abnormality. No skull fracture. IMPRESSION: 1. No acute intracranial abnormality. 2. Mild chronic ischemic changes of the white matter. Electronically signed by: Franky Stanford MD 03/11/2024 08:11 PM EDT RP Workstation: [id number]     Procedures   Medications Ordered in the ED  0.9 %  sodium chloride  infusion (0 mLs Intravenous Stopped 03/11/24 2309)  sodium chloride  0.9 % bolus 500 mL (0 mLs Intravenous Stopped 03/11/24 2137)  iohexol  (OMNIPAQUE ) 300 MG/ML solution 80 mL (80 mLs Intravenous Contrast Given 03/11/24 1943)  Medical Decision Making Amount and/or Complexity of Data Reviewed Labs: ordered. Radiology: ordered.  Risk Prescription drug management.   Patient here is quite alert.  Patient's lactic is 1.3.  That is very reassuring.  Complete metabolic panel significant for GFR 54 creatinine 1.33 LFTs normal electrolytes normal.  Glucose 125.  Anion gap normal.  CBC white count 12.4 hemoglobin 11.0 platelets 319.  Will repeat the urine here.  Will get chest x-ray will get head CT.  Since the white blood cell count is up some will CT abdomen as well.  CT chest abdomen and pelvis without any acute findings.  Urinalysis not consistent with urinary tract infection but did send for culture.  Patient was a little dehydrated based on his creatinine.  But did receive some IV fluids.  Will need to follow-up with primary care doctor to have that rechecked.    Final diagnoses:  Altered mental status, unspecified altered mental status type    ED Discharge Orders     None          Geraldene Hamilton, MD 03/11/24 1901    Geraldene Hamilton, MD 03/11/24 2317

## 2024-03-11 NOTE — Patient Instructions (Addendum)
 Please go to the emergency department for altered mental status.   Let them know that you have new onset hallucinations. You also have a history of acute metabolic encephalopathy and sepsis.   Your heart rate has not been in the 40s before today

## 2024-03-11 NOTE — Discharge Instructions (Signed)
 Urine culture sent.  You be notified if it is consistent with a urinary tract infection.  Kidney function a little bit worse than baseline but probably improved here with the IV fluids.  Recommend that you follow-up with your regular doctor to have it rechecked in a week.  CT head chest abdomen and pelvis without any acute findings.

## 2024-03-13 LAB — URINE CULTURE

## 2024-03-21 ENCOUNTER — Ambulatory Visit: Admitting: Podiatry

## 2024-03-28 ENCOUNTER — Other Ambulatory Visit: Payer: Self-pay | Admitting: Family Medicine

## 2024-04-21 ENCOUNTER — Other Ambulatory Visit: Payer: Self-pay | Admitting: Family Medicine

## 2024-04-28 NOTE — Telephone Encounter (Signed)
 Error

## 2024-05-03 ENCOUNTER — Other Ambulatory Visit: Payer: Self-pay | Admitting: Family Medicine

## 2024-05-18 ENCOUNTER — Observation Stay (HOSPITAL_COMMUNITY)
Admission: EM | Admit: 2024-05-18 | Discharge: 2024-05-19 | Disposition: A | Discharge status: Home / Self Care | Attending: Family Medicine | Admitting: Family Medicine

## 2024-05-18 ENCOUNTER — Other Ambulatory Visit: Payer: Self-pay

## 2024-05-18 ENCOUNTER — Emergency Department (HOSPITAL_COMMUNITY)

## 2024-05-18 ENCOUNTER — Encounter (HOSPITAL_COMMUNITY): Payer: Self-pay

## 2024-05-18 DIAGNOSIS — N1831 Chronic kidney disease, stage 3a: Secondary | ICD-10-CM | POA: Insufficient documentation

## 2024-05-18 DIAGNOSIS — K219 Gastro-esophageal reflux disease without esophagitis: Secondary | ICD-10-CM | POA: Insufficient documentation

## 2024-05-18 DIAGNOSIS — R0602 Shortness of breath: Secondary | ICD-10-CM | POA: Diagnosis not present

## 2024-05-18 DIAGNOSIS — R058 Other specified cough: Secondary | ICD-10-CM | POA: Diagnosis not present

## 2024-05-18 DIAGNOSIS — I10 Essential (primary) hypertension: Secondary | ICD-10-CM | POA: Diagnosis present

## 2024-05-18 DIAGNOSIS — R000 Tachycardia, unspecified: Secondary | ICD-10-CM | POA: Diagnosis not present

## 2024-05-18 DIAGNOSIS — E669 Obesity, unspecified: Secondary | ICD-10-CM | POA: Diagnosis not present

## 2024-05-18 DIAGNOSIS — E119 Type 2 diabetes mellitus without complications: Secondary | ICD-10-CM

## 2024-05-18 DIAGNOSIS — R059 Cough, unspecified: Secondary | ICD-10-CM | POA: Diagnosis not present

## 2024-05-18 DIAGNOSIS — E785 Hyperlipidemia, unspecified: Secondary | ICD-10-CM | POA: Diagnosis not present

## 2024-05-18 DIAGNOSIS — G4730 Sleep apnea, unspecified: Principal | ICD-10-CM | POA: Insufficient documentation

## 2024-05-18 DIAGNOSIS — I129 Hypertensive chronic kidney disease with stage 1 through stage 4 chronic kidney disease, or unspecified chronic kidney disease: Secondary | ICD-10-CM | POA: Diagnosis not present

## 2024-05-18 DIAGNOSIS — R404 Transient alteration of awareness: Secondary | ICD-10-CM | POA: Diagnosis not present

## 2024-05-18 DIAGNOSIS — R0989 Other specified symptoms and signs involving the circulatory and respiratory systems: Principal | ICD-10-CM

## 2024-05-18 DIAGNOSIS — I6782 Cerebral ischemia: Secondary | ICD-10-CM | POA: Diagnosis not present

## 2024-05-18 DIAGNOSIS — E872 Acidosis, unspecified: Secondary | ICD-10-CM | POA: Insufficient documentation

## 2024-05-18 DIAGNOSIS — J811 Chronic pulmonary edema: Secondary | ICD-10-CM | POA: Diagnosis not present

## 2024-05-18 DIAGNOSIS — Z79899 Other long term (current) drug therapy: Secondary | ICD-10-CM | POA: Diagnosis not present

## 2024-05-18 DIAGNOSIS — F419 Anxiety disorder, unspecified: Secondary | ICD-10-CM | POA: Insufficient documentation

## 2024-05-18 DIAGNOSIS — R0689 Other abnormalities of breathing: Secondary | ICD-10-CM

## 2024-05-18 DIAGNOSIS — Z6831 Body mass index (BMI) 31.0-31.9, adult: Secondary | ICD-10-CM | POA: Diagnosis not present

## 2024-05-18 DIAGNOSIS — R9082 White matter disease, unspecified: Secondary | ICD-10-CM | POA: Diagnosis not present

## 2024-05-18 DIAGNOSIS — R443 Hallucinations, unspecified: Secondary | ICD-10-CM | POA: Diagnosis present

## 2024-05-18 DIAGNOSIS — E1122 Type 2 diabetes mellitus with diabetic chronic kidney disease: Secondary | ICD-10-CM | POA: Diagnosis not present

## 2024-05-18 DIAGNOSIS — R4182 Altered mental status, unspecified: Secondary | ICD-10-CM | POA: Diagnosis present

## 2024-05-18 DIAGNOSIS — N400 Enlarged prostate without lower urinary tract symptoms: Secondary | ICD-10-CM | POA: Insufficient documentation

## 2024-05-18 DIAGNOSIS — F32A Depression, unspecified: Secondary | ICD-10-CM | POA: Insufficient documentation

## 2024-05-18 LAB — CBC WITH DIFFERENTIAL/PLATELET
Abs Immature Granulocytes: 0.05 K/uL (ref 0.00–0.07)
Basophils Absolute: 0.1 K/uL (ref 0.0–0.1)
Basophils Relative: 1 %
Eosinophils Absolute: 0.4 K/uL (ref 0.0–0.5)
Eosinophils Relative: 5 %
HCT: 36.5 % — ABNORMAL LOW (ref 39.0–52.0)
Hemoglobin: 11.3 g/dL — ABNORMAL LOW (ref 13.0–17.0)
Immature Granulocytes: 1 %
Lymphocytes Relative: 15 %
Lymphs Abs: 1.4 K/uL (ref 0.7–4.0)
MCH: 30.1 pg (ref 26.0–34.0)
MCHC: 31 g/dL (ref 30.0–36.0)
MCV: 97.3 fL (ref 80.0–100.0)
Monocytes Absolute: 0.6 K/uL (ref 0.1–1.0)
Monocytes Relative: 6 %
Neutro Abs: 6.9 K/uL (ref 1.7–7.7)
Neutrophils Relative %: 72 %
Platelets: 266 K/uL (ref 150–400)
RBC: 3.75 MIL/uL — ABNORMAL LOW (ref 4.22–5.81)
RDW: 17 % — ABNORMAL HIGH (ref 11.5–15.5)
WBC: 9.4 K/uL (ref 4.0–10.5)
nRBC: 0 % (ref 0.0–0.2)

## 2024-05-18 LAB — COMPREHENSIVE METABOLIC PANEL WITH GFR
ALT: 14 U/L (ref 0–44)
AST: <10 U/L — ABNORMAL LOW (ref 15–41)
Albumin: 3.4 g/dL — ABNORMAL LOW (ref 3.5–5.0)
Alkaline Phosphatase: 95 U/L (ref 38–126)
Anion gap: 14 (ref 5–15)
BUN: 29 mg/dL — ABNORMAL HIGH (ref 8–23)
CO2: 21 mmol/L — ABNORMAL LOW (ref 22–32)
Calcium: 8.4 mg/dL — ABNORMAL LOW (ref 8.9–10.3)
Chloride: 105 mmol/L (ref 98–111)
Creatinine, Ser: 1.3 mg/dL — ABNORMAL HIGH (ref 0.61–1.24)
GFR, Estimated: 56 mL/min — ABNORMAL LOW (ref >60)
Glucose, Bld: 114 mg/dL — ABNORMAL HIGH (ref 70–99)
Potassium: 4 mmol/L (ref 3.5–5.1)
Sodium: 140 mmol/L (ref 135–145)
Total Bilirubin: 0.7 mg/dL (ref 0.0–1.2)
Total Protein: 7.4 g/dL (ref 6.5–8.1)

## 2024-05-18 LAB — RESP PANEL BY RT-PCR (RSV, FLU A&B, COVID)  RVPGX2
Influenza A by PCR: NEGATIVE
Influenza B by PCR: NEGATIVE
Resp Syncytial Virus by PCR: NEGATIVE
SARS Coronavirus 2 by RT PCR: NEGATIVE

## 2024-05-18 LAB — URINALYSIS, W/ REFLEX TO CULTURE (INFECTION SUSPECTED)
Bilirubin Urine: NEGATIVE
Glucose, UA: NEGATIVE mg/dL
Hgb urine dipstick: NEGATIVE
Ketones, ur: NEGATIVE mg/dL
Leukocytes,Ua: NEGATIVE
Nitrite: NEGATIVE
Protein, ur: NEGATIVE mg/dL
Specific Gravity, Urine: 1.014 (ref 1.005–1.030)
pH: 5 (ref 5.0–8.0)

## 2024-05-18 LAB — I-STAT CG4 LACTIC ACID, ED: Lactic Acid, Venous: 0.7 mmol/L (ref 0.5–1.9)

## 2024-05-18 LAB — BRAIN NATRIURETIC PEPTIDE: B Natriuretic Peptide: 155.1 pg/mL — ABNORMAL HIGH (ref 0.0–100.0)

## 2024-05-18 LAB — TROPONIN I (HIGH SENSITIVITY): Troponin I (High Sensitivity): 6 ng/L (ref <18)

## 2024-05-18 MED ORDER — ENOXAPARIN SODIUM 40 MG/0.4ML IJ SOSY
40.0000 mg | PREFILLED_SYRINGE | INTRAMUSCULAR | Status: DC
  Administered 2024-05-19: 40 mg via SUBCUTANEOUS
  Filled 2024-05-18: qty 0.4

## 2024-05-18 NOTE — ED Notes (Signed)
 PT given a water  and a urinal to give a sample.

## 2024-05-18 NOTE — ED Triage Notes (Signed)
 PT BIB [facility] from home, lives with family.Family captured PT having a possible seizure where white foam was seen coming out of his mouth. 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

## 2024-05-18 NOTE — ED Provider Notes (Signed)
 [place] [facility] AT [facility] Provider Note   CSN: [id number] Arrival date & time: 05/18/24  1814     Patient presents with: No chief complaint on file.   Eduardo Moon is a 78 y.o. male.   The history is provided by the patient and medical records. No language interpreter was used.  Altered Mental Status Presenting symptoms: unresponsiveness   Presenting symptoms: no confusion   Presenting symptoms comment:  10 min per family could nto wake up and gargling breathing Severity:  Severe Episode history:  Unable to specify Duration:  10 minutes Timing:  Unable to specify Progression:  Resolved Chronicity:  New Associated symptoms: hallucinations   Associated symptoms: no abdominal pain, no agitation, no fever, no headaches, no nausea, no palpitations, no rash and no vomiting        Prior to Admission medications   Medication Sig Start Date End Date Taking? Authorizing Provider  albuterol  (PROVENTIL ) (2.5 MG/3ML) 0.083% nebulizer solution Take 3 mLs (2.5 mg total) by nebulization every 6 (six) hours as needed for wheezing or shortness of breath. 08/27/23   Henson, Vickie L, NP-C  albuterol  (VENTOLIN  HFA) 108 (90 Base) MCG/ACT inhaler TAKE 2 PUFFS BY MOUTH EVERY 6 HOURS AS NEEDED FOR WHEEZE OR SHORTNESS OF BREATH 03/29/24   Henson, Vickie L, NP-C  allopurinol  (ZYLOPRIM ) 300 MG tablet TAKE 1 TABLET BY MOUTH EVERY DAY 10/26/23   Henson, Vickie L, NP-C  amLODipine  (NORVASC ) 5 MG tablet TAKE 1 TABLET (5 MG TOTAL) BY MOUTH DAILY. 12/28/23   Henson, Vickie L, NP-C  furosemide  (LASIX ) 20 MG tablet Take 1 tablet (20 mg total) by mouth daily. 07/30/23   Henson, Vickie L, NP-C  lisinopril  (ZESTRIL ) 30 MG tablet TAKE 1 TABLET BY MOUTH EVERY DAY 05/03/24   Henson, Vickie L, NP-C  metoprolol  succinate (TOPROL -XL) 100 MG 24 hr tablet TAKE 1 TABLET BY MOUTH DAILY. TAKE WITH OR IMMEDIATELY FOLLOWING A MEAL. 12/28/23   Henson, Vickie L, NP-C  naproxen  (NAPROSYN ) 375 MG tablet TAKE 1  TABLET BY MOUTH TWICE A DAY AS NEEDED FOR JOINT PAIN 04/21/24   Henson, Vickie L, NP-C  omeprazole  (PRILOSEC) 20 MG capsule TAKE 1 CAPSULE BY MOUTH TWICE A DAY 02/01/24   Henson, Vickie L, NP-C  psyllium (METAMUCIL SMOOTH TEXTURE) 58.6 % powder Take 1 packet by mouth 3 (three) times daily as needed (constipation). 10/02/23   Patsey Lot, MD  rosuvastatin  (CRESTOR ) 5 MG tablet TAKE 1 TABLET BY MOUTH EVERY DAY IN THE EVENING 12/22/23   Henson, Vickie L, NP-C  sertraline  (ZOLOFT ) 50 MG tablet TAKE 1 TABLET BY MOUTH EVERY DAY 12/28/23   Henson, Vickie L, NP-C  tamsulosin  (FLOMAX ) 0.4 MG CAPS capsule Take 1 capsule (0.4 mg total) by mouth daily. 12/20/21   Jerri Keys, MD    Allergies: Patient has no known allergies.    Review of Systems  Constitutional:  Negative for chills, fatigue and fever.  HENT:  Negative for congestion.   Respiratory:  Negative for cough, chest tightness, shortness of breath and wheezing.   Cardiovascular:  Negative for chest pain and palpitations.  Gastrointestinal:  Negative for abdominal pain, constipation, diarrhea, nausea and vomiting.  Genitourinary:  Negative for dysuria, flank pain and frequency.  Musculoskeletal:  Negative for back pain, neck pain and neck stiffness.  Skin:  Negative for rash and wound.  Neurological:  Negative for headaches.  Psychiatric/Behavioral:  Positive for hallucinations. Negative for agitation and confusion.   All other systems reviewed and are  negative.   Updated Vital Signs BP (!) 149/75   Pulse 77   Temp 98.7 F (37.1 C) (Oral)   Resp (!) 22   Ht 5' 7 (1.702 m)   Wt 91 kg   SpO2 100%   BMI 31.42 kg/m   Physical Exam Vitals and nursing note reviewed.  Constitutional:      General: He is not in acute distress.    Appearance: He is well-developed. He is not ill-appearing, toxic-appearing or diaphoretic.  HENT:     Head: Normocephalic and atraumatic.     Nose: No congestion or rhinorrhea.     Mouth/Throat:     Mouth: Mucous  membranes are moist.     Pharynx: No oropharyngeal exudate or posterior oropharyngeal erythema.  Eyes:     Extraocular Movements: Extraocular movements intact.     Conjunctiva/sclera: Conjunctivae normal.     Pupils: Pupils are equal, round, and reactive to light.  Cardiovascular:     Rate and Rhythm: Normal rate and regular rhythm.     Heart sounds: No murmur heard. Pulmonary:     Effort: Pulmonary effort is normal. No respiratory distress.     Breath sounds: Normal breath sounds. No wheezing, rhonchi or rales.  Chest:     Chest wall: No tenderness.  Abdominal:     Palpations: Abdomen is soft.     Tenderness: There is no abdominal tenderness. There is no guarding or rebound.  Musculoskeletal:        General: No swelling or tenderness.     Cervical back: Neck supple. No tenderness.     Right lower leg: No edema.     Left lower leg: No edema.  Skin:    General: Skin is warm and dry.     Capillary Refill: Capillary refill takes less than 2 seconds.     Findings: No erythema or rash.  Neurological:     General: No focal deficit present.     Mental Status: He is alert.     Sensory: No sensory deficit.     Motor: No weakness.      (all labs ordered are listed, but only abnormal results are displayed) Labs Reviewed  CBC WITH DIFFERENTIAL/PLATELET - Abnormal; Notable for the following components:      Result Value   RBC 3.75 (*)    Hemoglobin 11.3 (*)    HCT 36.5 (*)    RDW 17.0 (*)    All other components within normal limits  COMPREHENSIVE METABOLIC PANEL WITH GFR - Abnormal; Notable for the following components:   CO2 21 (*)    Glucose, Bld 114 (*)    BUN 29 (*)    Creatinine, Ser 1.30 (*)    Calcium  8.4 (*)    Albumin 3.4 (*)    AST <10 (*)    GFR, Estimated 56 (*)    All other components within normal limits  BRAIN NATRIURETIC PEPTIDE - Abnormal; Notable for the following components:   B Natriuretic Peptide 155.1 (*)    All other components within normal limits   RESP PANEL BY RT-PCR (RSV, FLU A&B, COVID)  RVPGX2  URINALYSIS, W/ REFLEX TO CULTURE (INFECTION SUSPECTED)  I-STAT CG4 LACTIC ACID, ED  I-STAT CG4 LACTIC ACID, ED  TROPONIN I (HIGH SENSITIVITY)  TROPONIN I (HIGH SENSITIVITY)    EKG: EKG Interpretation Date/Time:  Wednesday May 18 2024 18:24:23 EDT Ventricular Rate:  69 PR Interval:  201 QRS Duration:  104 QT Interval:  411 QTC Calculation: 441 R  Axis:   46  Text Interpretation: Sinus rhythm Ventricular premature complex when compared to prior, new PVC No STEMI Confirmed by Ginger Barefoot ([id number]) on 05/18/2024 6:49:45 PM  Radiology: DG Chest 2 View Result Date: 05/18/2024 CLINICAL DATA:  productive cough, SOB, edema EXAM: CHEST - 2 VIEW COMPARISON:  01/05/2023 FINDINGS: Lower lung volumes. No focal airspace consolidation, pleural effusion, or pneumothorax. No cardiomegaly. Aortic atherosclerosis. No acute fracture or destructive lesions. Multilevel thoracic osteophytosis. IMPRESSION: Low lung volumes.  Otherwise, no acute cardiopulmonary abnormality. Electronically Signed   By: Rogelia Myers M.D.   On: 05/18/2024 19:47     Procedures   Medications Ordered in the ED - No data to display                                  Medical Decision Making Amount and/or Complexity of Data Reviewed Labs: ordered. Radiology: ordered.    Eduardo Moon is a 78 y.o. 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  According to patient, for the last few days he has had coughing and had a big coughing fit today where he was coughing up some white sputum and his family was concerned he was having a seizure although patient tells me he remembers everything and did not lose consciousness.  He says he did not have a seizure just was coughing so hard.  He  denied falling or hitting his head.  Denies headache or current neck pain.  Denies any chest pain but does have the shortness of breath with his coughing.  Denies abdominal pain, nausea, vomiting, constipation or urinary changes.  He does report some diarrhea.  He does say has had some sick contacts with URI over the last few weeks.  He reports he has some edema in his legs but it does not seem much worse than normal.  He does not take oxygen at home.  He denies other complaints at this time.  Denies any focal neurologic changes from his baseline.  On exam, lungs have rales and coarse rhonchi.  He did not have any wheezing for me.  I did not appreciate a murmur.  Chest back and abdomen completely nontender.  Legs have intact sensation and have weakness that is at his baseline.  Mild edema seen.  Patient otherwise resting with no focal neurologic deficits from his baseline.  He has no neck tenderness or head tenderness or evidence of trauma.  Pupils symmetric and reactive and normal extract movements.  Symmetric smile.  Clear speech.  We had a shared decision-making conversation.  Patient says he did not have a seizure and does not think he needs imaging of his head given his lack of fall injury or neurologic changes.  We will get a chest x-ray to look for pneumonia and check for COVID/flu/RSV given the ongoing pandemic.  Will get screening labs and will look for fluid overload given his lung exam the edema and his productive cough.  Will check other labs as well.  Anticipate reassessment after workup to determine disposition.  9:52 PM Workup continues to return.  Initial troponin is normal, will trend.  BNP slightly elevated at 155.  CBC shows no leukocytosis and minimal anemia and metabolic panel appears similar to prior.  Chest x-ray did not show  clear pneumonia.  Family arrived and was able to provide a video of what the patient did that was still concerning and it indeed was concerning.  Patient was  gurgling/screaming/foaming at the mouth.  Unclear if this is more of a sleep apnea episode versus some seizure versus a syncopal episode however patient did not wake up when they tried to wake him.  I was able to take a clinical video in the media part of the chart to review.  Family gave permission for this video to be recorded other video.  Family also says that over the last while he has been having more hallucinations and confusion and they are concerned he could have a UTI as well.  Will get urinalysis and will speak to neurology about if he needs intracranial imaging.  Anticipate reassessment after workup..  10:51 PM Spoke to neurology who does feel the patient is appropriate to get EEG and a head CT.  Will also get a urinalysis given the family reports that he has had more altered mental status and hallucinations here recently and he did the similar with the UTI.  Patient may end up needing In-N-Out catheter but he will likely admission for intermittent aspirating tight breathing with altered mental status.  Care will be transferred oncoming team to wait for workup results.     Final diagnoses:  Gurgling breath sounds  Transient alteration of awareness  Gasping for breath  Hallucinations     Clinical Impression: 1. Gurgling breath sounds   2. Transient alteration of awareness   3. Gasping for breath   4. Hallucinations     Disposition: Care transferred oncoming team to wait for CT head results and urinalysis, will need admission for altered mental status episode with difficulty breathing and hallucinations.  This note was prepared with assistance of Conservation officer, historic buildings. Occasional wrong-word or sound-a-like substitutions may have occurred due to the inherent limitations of voice recognition software.      Adilyn Humes, Lonni PARAS, MD 05/18/24 [phone]

## 2024-05-18 NOTE — ED Notes (Signed)
 Patient transported to X-ray

## 2024-05-18 NOTE — H&P (Incomplete)
 History and Physical  Eduardo Moon:[id number] DOB: [birth year] DOA: 05/18/2024  Referring physician: Dr. Trine, EDP  PCP: Eduardo Boby CROME, NP-C  Outpatient Specialists: Cardiology, orthopedic surgery. Patient coming from: Home.  Chief Complaint: Gurgly sound and foaming at the mouth.  HPI: Eduardo Moon is a 78 y.o. 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  This was noted by his daughter today who captured it on Camera.  Per the daughter this is the first time she sees them foaming at the mouth.  No known history of aspiration.  The video was viewed by EDP and myself.  EDP discussed the case with neurology who recommended EEG.  Admitted by [facility], hospitalist service.  At the time of visit the patient is alert and appropriate.  Denies history of dysphagia.  Per his daughter at bedside his nose and have brief episodes of apnea.  A chest x-ray done in the ER showed low lung volumes.  Otherwise no acute cardiopulmonary abnormality.CT head without contrast showed no acute intracranial abnormality.  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  Admitted by [facility], hospitalist service.  ED Course: Temperature 97.9.  BP 159/68, pulse 62, respiratory rate 21, O2 saturation 100% on room air.  Lab studies notable for BUN 29, creatinine 1.30.  BNP 155.  Hemoglobin 11.3.  Review of Systems: Review of systems as noted in the HPI. All other systems reviewed and are negative.   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   uses w/c for distance   Past Surgical History:  Procedure Laterality Date   ANKLE RECONSTRUCTION  1957   BILATERAL   ORCHIECTOMY  05/07/2012   Procedure: ORCHIECTOMY;  Surgeon: Alm GORMAN Fragmin, MD;  Location: [facility];  Service: Urology;  Laterality: Right;   RADIOACTIVE PROSTATE SEED IMPLANTS  04-21-2011    DR FRAGMIN   PROSTATE CANCER   SCROTAL EXPLORATION  05/07/2012   Procedure: SCROTUM EXPLORATION;  Surgeon: Alm GORMAN Fragmin, MD;  Location: [facility];  Service: Urology;  Laterality: Right;  DRAINAGE OF ABSCESS  1 HR  [employer] MEDICARE    Social History:  reports that he has never smoked. He has never used smokeless tobacco. He reports that he does not drink alcohol and does not use drugs.   No Known Allergies  Family History  Problem Relation Age of Onset   Cancer Mother    Cancer Father    Cancer Brother       Prior to Admission medications   Medication Sig Start Date End Date Taking? Authorizing Provider  albuterol  (PROVENTIL ) (2.5 MG/3ML) 0.083% nebulizer solution Take 3 mLs (2.5 mg total) by nebulization every 6 (six) hours as needed for wheezing or shortness of breath. 08/27/23   Henson, Vickie L, NP-C  albuterol  (VENTOLIN  HFA) 108 (90 Base) MCG/ACT inhaler TAKE 2 PUFFS BY MOUTH EVERY 6 HOURS AS NEEDED FOR WHEEZE OR SHORTNESS OF BREATH 03/29/24   Eduardo,  Vickie L, NP-C  allopurinol  (ZYLOPRIM ) 300 MG tablet TAKE 1 TABLET BY MOUTH EVERY DAY 10/26/23   Henson, Vickie L, NP-C  amLODipine  (NORVASC ) 5 MG tablet TAKE 1 TABLET (5 MG TOTAL) BY MOUTH DAILY. 12/28/23   Henson, Vickie L, NP-C  furosemide  (LASIX ) 20 MG tablet Take 1 tablet (20 mg total) by mouth daily. 07/30/23   Henson, Vickie L, NP-C  lisinopril  (ZESTRIL ) 30 MG tablet TAKE 1 TABLET BY MOUTH EVERY DAY 05/03/24   Henson, Vickie L, NP-C  metoprolol  succinate (TOPROL -XL) 100 MG 24 hr tablet TAKE 1 TABLET BY MOUTH  DAILY. TAKE WITH OR IMMEDIATELY FOLLOWING A MEAL. 12/28/23   Henson, Vickie L, NP-C  naproxen  (NAPROSYN ) 375 MG tablet TAKE 1 TABLET BY MOUTH TWICE A DAY AS NEEDED FOR JOINT PAIN 04/21/24   Henson, Vickie L, NP-C  omeprazole  (PRILOSEC) 20 MG capsule TAKE 1 CAPSULE BY MOUTH TWICE A DAY 02/01/24   Henson, Vickie L, NP-C  psyllium (METAMUCIL SMOOTH TEXTURE) 58.6 % powder Take 1 packet by mouth 3 (three) times daily as needed (constipation). 10/02/23   Patsey Lot, MD  rosuvastatin  (CRESTOR ) 5 MG tablet TAKE 1 TABLET BY MOUTH EVERY DAY IN THE EVENING 12/22/23   Henson, Vickie L, NP-C  sertraline  (ZOLOFT ) 50 MG tablet TAKE 1 TABLET BY MOUTH EVERY DAY 12/28/23   Henson, Vickie L, NP-C  tamsulosin  (FLOMAX ) 0.4 MG CAPS capsule Take 1 capsule (0.4 mg total) by mouth daily. 12/20/21   Jerri Keys, MD    Physical Exam: BP (!) 159/68   Pulse 76   Temp 97.9 F (36.6 C) (Oral)   Resp 16   Ht 5' 7 (1.702 m)   Wt 91 kg   SpO2 100%   BMI 31.42 kg/m   General: 78 y.o. year-old male well developed well nourished in no acute distress.  Alert and oriented x3. Cardiovascular: Regular rate and rhythm with no rubs or gallops.  No thyromegaly or JVD noted.  No lower extremity edema. 2/4 pulses in all 4 extremities. Respiratory: Clear to auscultation with no wheezes or rales. Good inspiratory effort. Abdomen: Soft nontender nondistended with normal bowel sounds x4 quadrants. Muskuloskeletal: No cyanosis, clubbing or edema noted bilaterally Neuro: CN II-XII intact, strength, sensation, reflexes Skin: No ulcerative lesions noted or rashes Psychiatry: Judgement and insight appear normal. Mood is appropriate for condition and setting           Labs on Admission:  Basic Metabolic Panel: Recent Labs  Lab 05/18/24 2000  NA 140  K 4.0  CL 105  CO2 21*  GLUCOSE 114*  BUN 29*  CREATININE 1.30*  CALCIUM  8.4*   Liver Function Tests: Recent Labs  Lab 05/18/24 2000  AST <10*  ALT 14  ALKPHOS 95  BILITOT 0.7   PROT 7.4  ALBUMIN 3.4*   No results for input(s): LIPASE, AMYLASE in the last 168 hours. No results for input(s): AMMONIA in the last 168 hours. CBC: Recent Labs  Lab 05/18/24 2000  WBC 9.4  NEUTROABS 6.9  HGB 11.3*  HCT 36.5*  MCV 97.3  PLT 266   Cardiac Enzymes: No results for input(s): CKTOTAL, CKMB, CKMBINDEX, TROPONINI in the last 168 hours.  BNP (last 3 results) Recent Labs    05/18/24 2000  BNP 155.1*    ProBNP (last 3 results) No results for input(s): PROBNP in the last 8760 hours.  CBG: No results for input(s): GLUCAP in the last 168 hours.  Radiological Exams on Admission: CT Head Wo Contrast Result  Date: 05/18/2024 EXAM: CT HEAD WITHOUT CONTRAST 05/18/2024 10:58:54 PM TECHNIQUE: CT of the head was performed without the administration of intravenous contrast. Automated exposure control, iterative reconstruction, and/or weight based adjustment of the mA/kV was utilized to reduce the radiation dose to as low as reasonably achievable. COMPARISON: 03/11/2024 CLINICAL HISTORY: Mental status change, unknown cause. FINDINGS: BRAIN AND VENTRICLES: No acute hemorrhage. No evidence of acute infarct. No hydrocephalus. No extra-axial collection. No mass effect or midline shift. Age-related mild cerebral volume loss. Moderate periventricular and deep cerebral white matter disease. ORBITS: No acute abnormality. Status post bilateral lens replacement. SINUSES: Mild mucosal disease within ethmoid air cells. SOFT TISSUES AND SKULL: No acute soft tissue abnormality. No skull fracture. IMPRESSION: 1. No acute intracranial abnormality. 2. 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 Electronically signed by: Franky Stanford MD 05/18/2024 11:09 PM EDT RP Workstation: [id number]   DG Chest 2 View Result Date: 05/18/2024 CLINICAL DATA:  productive cough, SOB, edema EXAM: CHEST - 2 VIEW COMPARISON:  01/05/2023 FINDINGS:  Lower lung volumes. No focal airspace consolidation, pleural effusion, or pneumothorax. No cardiomegaly. Aortic atherosclerosis. No acute fracture or destructive lesions. Multilevel thoracic osteophytosis. IMPRESSION: Low lung volumes.  Otherwise, no acute cardiopulmonary abnormality. Electronically Signed   By: Rogelia Myers M.D.   On: 05/18/2024 19:47    EKG: I independently viewed the EKG done and my findings are as followed: None available at the time of this visit.  Assessment/Plan Present on Admission:  Transient alteration of awareness  Principal Problem:   Transient alteration of awareness  Transient alteration of awareness, POA Sleep disordered breathing  Suspect obesity hypoventilation syndrome and possible OSA. The patient's daughter reports snoring and episodes of apnea during sleep BMI 31 Follow-up VBG to assess for pH and pCO2. Follow EEG, recommended by neurology. Reorient as needed Fall and aspiration precautions.  Suspected obstructive sleep apnea Recommend outpatient polysomnography Will need referral to pulmonology at discharge  CKD 3 A Renal function is at baseline Avoid nephrotoxic agents, dehydration, and hypotension. Monitor urine output Repeat BMP in the morning.  Mild non-anion gap metabolic acidosis Serum bicarb 21, anion gap 14 Monitor for now  Prediabetes, diet controlled Hold off subcu insulin  to avoid hypoglycemia. Monitor serum glucose twice daily  Hyperlipidemia Resume home Crestor   Chronic anxiety/depression Resume home Zoloft   BPH Resume home Flomax  Monitor urine output  GERD Resume home PPI  Obesity BMI 31 Recommend weight loss outpatient with healthy diet.   Time: 75 minutes.   DVT prophylaxis: Subcu Lovenox  daily.  Code Status: Full code.  Family Communication: Updated patient's daughter at bedside.  Disposition Plan: Admitted to telemetry medical unit.  Consults called: EDP discussed the case with neurology  Dr. Hillman.  Admission status: Observation status.   Status is: Observation    Terry LOISE Hurst MD [facility] Pager [phone]  If 7PM-7AM, please contact night-coverage [email or web address] Password [email or web address]  05/18/2024, 11:36 PM

## 2024-05-19 ENCOUNTER — Other Ambulatory Visit: Payer: Self-pay

## 2024-05-19 ENCOUNTER — Observation Stay (HOSPITAL_COMMUNITY)

## 2024-05-19 DIAGNOSIS — R569 Unspecified convulsions: Secondary | ICD-10-CM

## 2024-05-19 DIAGNOSIS — R404 Transient alteration of awareness: Secondary | ICD-10-CM | POA: Diagnosis not present

## 2024-05-19 LAB — PHOSPHORUS: Phosphorus: 3.9 mg/dL (ref 2.5–4.6)

## 2024-05-19 LAB — BASIC METABOLIC PANEL WITH GFR
Anion gap: 9 (ref 5–15)
BUN: 24 mg/dL — ABNORMAL HIGH (ref 8–23)
CO2: 20 mmol/L — ABNORMAL LOW (ref 22–32)
Calcium: 8 mg/dL — ABNORMAL LOW (ref 8.9–10.3)
Chloride: 114 mmol/L — ABNORMAL HIGH (ref 98–111)
Creatinine, Ser: 1.09 mg/dL (ref 0.61–1.24)
GFR, Estimated: >60 mL/min (ref >60)
Glucose, Bld: 99 mg/dL (ref 70–99)
Potassium: 3.8 mmol/L (ref 3.5–5.1)
Sodium: 143 mmol/L (ref 135–145)

## 2024-05-19 LAB — CBC
HCT: 33.3 % — ABNORMAL LOW (ref 39.0–52.0)
Hemoglobin: 10.6 g/dL — ABNORMAL LOW (ref 13.0–17.0)
MCH: 30.8 pg (ref 26.0–34.0)
MCHC: 31.8 g/dL (ref 30.0–36.0)
MCV: 96.8 fL (ref 80.0–100.0)
Platelets: 248 K/uL (ref 150–400)
RBC: 3.44 MIL/uL — ABNORMAL LOW (ref 4.22–5.81)
RDW: 17 % — ABNORMAL HIGH (ref 11.5–15.5)
WBC: 9.6 K/uL (ref 4.0–10.5)
nRBC: 0 % (ref 0.0–0.2)

## 2024-05-19 LAB — BLOOD GAS, VENOUS
Acid-base deficit: 1.1 mmol/L (ref 0.0–2.0)
Bicarbonate: 24.8 mmol/L (ref 20.0–28.0)
O2 Saturation: 55.1 %
Patient temperature: 37
pCO2, Ven: 45 mmHg (ref 44–60)
pH, Ven: 7.35 (ref 7.25–7.43)
pO2, Ven: 32 mmHg (ref 32–45)

## 2024-05-19 LAB — I-STAT CG4 LACTIC ACID, ED: Lactic Acid, Venous: 0.4 mmol/L — ABNORMAL LOW (ref 0.5–1.9)

## 2024-05-19 LAB — TROPONIN I (HIGH SENSITIVITY): Troponin I (High Sensitivity): 6 ng/L (ref <18)

## 2024-05-19 LAB — MAGNESIUM: Magnesium: 1.6 mg/dL — ABNORMAL LOW (ref 1.7–2.4)

## 2024-05-19 MED ORDER — SERTRALINE HCL 50 MG PO TABS
50.0000 mg | ORAL_TABLET | Freq: Every day | ORAL | Status: DC
  Administered 2024-05-19: 50 mg via ORAL
  Filled 2024-05-19: qty 1

## 2024-05-19 MED ORDER — METOPROLOL SUCCINATE ER 25 MG PO TB24
50.0000 mg | ORAL_TABLET | Freq: Every day | ORAL | Status: DC
  Administered 2024-05-19: 50 mg via ORAL
  Filled 2024-05-19: qty 2

## 2024-05-19 MED ORDER — ACETAMINOPHEN 500 MG PO TABS: 500.0000 mg | ORAL_TABLET | Freq: Four times a day (QID) | ORAL | Status: DC | PRN

## 2024-05-19 MED ORDER — PANTOPRAZOLE SODIUM 40 MG PO TBEC
40.0000 mg | DELAYED_RELEASE_TABLET | Freq: Every day | ORAL | Status: DC
  Administered 2024-05-19: 40 mg via ORAL
  Filled 2024-05-19: qty 1

## 2024-05-19 MED ORDER — PROCHLORPERAZINE EDISYLATE 10 MG/2ML IJ SOLN: 5.0000 mg | Freq: Four times a day (QID) | INTRAMUSCULAR | Status: DC | PRN

## 2024-05-19 MED ORDER — POLYETHYLENE GLYCOL 3350 17 G PO PACK: 17.0000 g | PACK | Freq: Every day | ORAL | Status: DC | PRN

## 2024-05-19 MED ORDER — MELATONIN 5 MG PO TABS
5.0000 mg | ORAL_TABLET | Freq: Every evening | ORAL | Status: DC | PRN
Start: 2024-05-19 — End: 2024-05-19

## 2024-05-19 MED ORDER — ROSUVASTATIN CALCIUM 5 MG PO TABS
5.0000 mg | ORAL_TABLET | Freq: Every day | ORAL | Status: DC
  Administered 2024-05-19: 5 mg via ORAL
  Filled 2024-05-19: qty 1

## 2024-05-19 MED ORDER — ALLOPURINOL 100 MG PO TABS
300.0000 mg | ORAL_TABLET | Freq: Every day | ORAL | Status: DC
  Administered 2024-05-19: 300 mg via ORAL
  Filled 2024-05-19: qty 3

## 2024-05-19 MED ORDER — LACTATED RINGERS IV SOLN: INTRAVENOUS | Status: DC

## 2024-05-19 MED ORDER — TAMSULOSIN HCL 0.4 MG PO CAPS
0.4000 mg | ORAL_CAPSULE | Freq: Every day | ORAL | Status: DC
  Administered 2024-05-19: 0.4 mg via ORAL
  Filled 2024-05-19: qty 1

## 2024-05-19 MED ORDER — MAGNESIUM SULFATE 2 GM/50ML IV SOLN
2.0000 g | Freq: Once | INTRAVENOUS | Status: AC
  Administered 2024-05-19: 2 g via INTRAVENOUS
  Filled 2024-05-19: qty 50

## 2024-05-19 MED ORDER — AMLODIPINE BESYLATE 5 MG PO TABS
5.0000 mg | ORAL_TABLET | Freq: Every day | ORAL | Status: DC
  Administered 2024-05-19: 5 mg via ORAL
  Filled 2024-05-19: qty 1

## 2024-05-19 NOTE — Procedures (Signed)
 Patient Name: Eduardo Moon  MRN: [id number]  Epilepsy Attending: Arlin MALVA Krebs  Referring Physician/Provider: Tegeler, Lonni PARAS, MD  Date: 05/19/2024 Duration: 27.05 mins  Patient history: 78yo M with transient alteration of awareness. EEG to evaluate for seizure.  Level of alertness: Awake, asleep  AEDs during EEG study: None  Technical aspects: This EEG study was done with scalp electrodes positioned according to the 10-20 International system of electrode placement. Electrical activity was reviewed with band pass filter of 1-70Hz , sensitivity of 7 uV/mm, display speed of 68mm/sec with a 60Hz  notched filter applied as appropriate. EEG data were recorded continuously and digitally stored.  Video monitoring was available and reviewed as appropriate.  Description: The posterior dominant rhythm consists of 8 Hz activity of moderate voltage (25-35 uV) seen predominantly in posterior head regions, symmetric and reactive to eye opening and eye closing. Sleep was characterized by vertex waves, sleep spindles (12 to 14 Hz), maximal frontocentral region.  Physiologic photic driving was not seen during photic stimulation.  Hyperventilation was not performed.     IMPRESSION: This study is within normal limits. No seizures or epileptiform discharges were seen throughout the recording.  A normal interictal EEG does not exclude the diagnosis of epilepsy.   Eduardo Moon

## 2024-05-19 NOTE — ED Notes (Signed)
 PTAR Notified

## 2024-05-19 NOTE — ED Notes (Signed)
RN provided warm blanket 

## 2024-05-19 NOTE — ED Notes (Signed)
Provided pt water.  °

## 2024-05-19 NOTE — Discharge Summary (Signed)
 Physician Discharge Summary  Eduardo Moon FMW:[id number] DOB: 1945-10-13 DOA: 05/18/2024  PCP: Eduardo Boby CROME, NP-C  Admit date: 05/18/2024 Discharge date: 05/19/2024    Admitted From: Home Disposition: Home Home  Recommendations for Outpatient Follow-up:  Follow up with PCP in 1-2 weeks Please obtain BMP/CBC in one week Please follow up with your PCP on the following pending results: Unresulted Labs (From admission, onward)     Start     Ordered   05/25/24 0500  Creatinine, serum  (enoxaparin  (LOVENOX )    CrCl >/= 30 ml/min)  Weekly,   R     Comments: while on enoxaparin  therapy    05/18/24 2335              Home Health: None Equipment/Devices: None  Discharge Condition: Stable CODE STATUS: Full code Diet recommendation:  Diet Order             Diet regular Room service appropriate? Yes; Fluid consistency: Thin  Diet effective now                 Due to brief hospitalization, have copied admitting hospitalist HPI and ED course as below.  HPI: Eduardo Moon is a 78 y.o. 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  This was noted by his daughter today who captured it on Camera.  Per the daughter this is the first time she sees them foaming at the mouth.  No known history of aspiration.  The video was viewed by EDP and myself.  EDP discussed the case with neurology who recommended EEG.  Admitted by [facility]  [facility], hospitalist service.   At the time of visit the patient is alert and appropriate.  Denies history of dysphagia.  Per his daughter at bedside his nose and have brief episodes of apnea.  A chest x-ray done in the ER showed low lung volumes.  Otherwise no acute cardiopulmonary abnormality.CT head without contrast showed no acute  intracranial abnormality.  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   Admitted by Eduardo Moon, hospitalist service.   ED Course: Temperature 97.9.  BP 159/68, pulse 62, respiratory rate 21, O2 saturation 100% on room air.  Lab studies notable for BUN 29, creatinine 1.30.  BNP 155.  Hemoglobin 11.3.  Subjective: Patient seen and examined earlier, he was fully alert and oriented and he had no complaints at all.  He was happy with going back home today.  Brief/Interim Summary: Details of hospitalization as below.  Transient alteration of awareness, POA / sleep disordered breathing/suspected obstructive sleep apnea. Based on the history, there were some concerns for possible seizure however suspect obesity hypoventilation syndrome and possible OSA. The patient's daughter reports snoring and episodes of apnea during sleep BMI 31.  Patient was admitted for further workup of seizure, underwent EEG which was negative for epileptiform activity, was not noted to have any seizure-like activity.  Discussed with neurologist Eduardo Moon, he recommended no further workup and follow-up outpatient with neurology in 4 weeks.  He cleared the patient for discharge.  Recommend outpatient sleep study.  CKD 3 A Renal function is at baseline   Mild non-anion gap metabolic acidosis Stable.   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    Discharge plan was discussed with patient and/or family member/daughter over the phone and they verbalized understanding and agreed with it.  Discharge Diagnoses:  Principal Problem:   Transient alteration of awareness Active Problems:   HTN (hypertension)   DM2 (diabetes mellitus, type 2)  (HCC)    Discharge Instructions   Allergies as of 05/19/2024   No Known Allergies      Medication List     TAKE these medications    albuterol  (2.5 MG/3ML) 0.083% nebulizer solution Commonly known as: PROVENTIL  Take 3 mLs (2.5 mg total) by nebulization every 6 (six) hours as needed for wheezing or shortness of breath.   albuterol  108 (90 Base) MCG/ACT inhaler Commonly known as: VENTOLIN  HFA TAKE 2 PUFFS BY MOUTH EVERY 6 HOURS AS NEEDED FOR WHEEZE OR SHORTNESS OF BREATH   allopurinol  300 MG tablet Commonly known as: ZYLOPRIM  TAKE 1 TABLET BY MOUTH EVERY DAY   amLODipine  5 MG tablet Commonly known as: NORVASC  TAKE 1 TABLET (5 MG TOTAL) BY MOUTH DAILY.   furosemide  20 MG tablet Commonly known as: LASIX  Take 1 tablet (20 mg total) by mouth daily.   lisinopril  30 MG tablet Commonly known as: ZESTRIL  TAKE 1 TABLET BY MOUTH EVERY DAY   Metamucil Smooth Texture 58.6 % powder Generic drug: psyllium Take 1 packet by mouth 3 (three) times daily as needed (constipation).   metoprolol  succinate 100 MG 24 hr tablet Commonly known as: TOPROL -XL TAKE 1 TABLET BY MOUTH DAILY. TAKE WITH OR IMMEDIATELY FOLLOWING A MEAL.   naproxen  375 MG tablet Commonly known as: NAPROSYN  TAKE 1 TABLET BY MOUTH TWICE A DAY AS NEEDED FOR JOINT PAIN   omeprazole  20 MG capsule Commonly known as: PRILOSEC TAKE 1 CAPSULE BY MOUTH TWICE A DAY   rosuvastatin  5 MG tablet Commonly known as: CRESTOR  TAKE 1 TABLET BY MOUTH EVERY DAY IN THE EVENING   sertraline  50 MG tablet Commonly known as: ZOLOFT  TAKE 1 TABLET BY MOUTH EVERY DAY   tamsulosin  0.4 MG Caps capsule Commonly known as: FLOMAX  Take 1 capsule (0.4 mg total) by mouth daily.        Follow-up Information     Moon, Eduardo L, NP-C Follow up in 1 week(s).   Specialty: Family Medicine Contact information: [address] [place] [address] [phone]                No Known Allergies  Consultations: Neurology  phone call   Procedures/Studies: EEG adult Result Date: 05/19/2024 Eduardo Eduardo KIDD, MD     05/19/2024 10:22 AM Patient Name: Eduardo Moon MRN: [id number] Epilepsy Attending: Arlin Moon Eduardo Referring Physician/Provider: Tegeler, Moon PARAS, MD Date: 05/19/2024 Duration: 27.05 mins Patient history: 78yo M with transient alteration of awareness. EEG to evaluate for seizure. Level of alertness: Awake, asleep AEDs during EEG study: None Technical aspects: This EEG study was done with scalp electrodes positioned according to the 10-20 International system of electrode placement. Electrical activity was reviewed with band pass filter of 1-70Hz , sensitivity of 7 uV/mm, display speed of 78mm/sec with a 60Hz  notched filter applied as appropriate. EEG data were recorded continuously and digitally stored.  Video monitoring was available and reviewed as appropriate. Description: The posterior dominant rhythm consists of 8 Hz activity of moderate voltage (25-35 uV) seen predominantly in posterior head regions, symmetric and reactive to  eye opening and eye closing. Sleep was characterized by vertex waves, sleep spindles (12 to 14 Hz), maximal frontocentral region.  Physiologic photic driving was not seen during photic stimulation.  Hyperventilation was not performed.   IMPRESSION: This study is within normal limits. No seizures or epileptiform discharges were seen throughout the recording. A normal interictal EEG does not exclude the diagnosis of epilepsy. Priyanka O Yadav   CT Head Wo Contrast Result Date: 05/18/2024 EXAM: CT HEAD WITHOUT CONTRAST 05/18/2024 10:58:54 PM TECHNIQUE: CT of the head was performed without the administration of intravenous contrast. Automated exposure control, iterative reconstruction, and/or weight based adjustment of the mA/kV was utilized to reduce the radiation dose to as low as reasonably achievable. COMPARISON: 03/11/2024 CLINICAL HISTORY: Mental status change, unknown cause.  FINDINGS: BRAIN AND VENTRICLES: No acute hemorrhage. No evidence of acute infarct. No hydrocephalus. No extra-axial collection. No mass effect or midline shift. Age-related mild cerebral volume loss. Moderate periventricular and deep cerebral white matter disease. ORBITS: No acute abnormality. Status post bilateral lens replacement. SINUSES: Mild mucosal disease within ethmoid air cells. SOFT TISSUES AND SKULL: No acute soft tissue abnormality. No skull fracture. IMPRESSION: 1. No acute intracranial abnormality. 2. 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 Electronically signed by: Franky Stanford MD 05/18/2024 11:09 PM EDT RP Workstation: [id number]   DG Chest 2 View Result Date: 05/18/2024 CLINICAL DATA:  productive cough, SOB, edema EXAM: CHEST - 2 VIEW COMPARISON:  01/05/2023 FINDINGS: Lower lung volumes. No focal airspace consolidation, pleural effusion, or pneumothorax. No cardiomegaly. Aortic atherosclerosis. No acute fracture or destructive lesions. Multilevel thoracic osteophytosis. IMPRESSION: Low lung volumes.  Otherwise, no acute cardiopulmonary abnormality. Electronically Signed   By: Rogelia Myers M.D.   On: 05/18/2024 19:47     Discharge Exam: Vitals:   05/19/24 0900 05/19/24 1000  BP: (!) 128/57 (!) 136/51  Pulse: 76 78  Resp: 15 13  Temp:  97.9 F (36.6 C)  SpO2: 100% 100%   Vitals:   05/19/24 0700 05/19/24 0800 05/19/24 0900 05/19/24 1000  BP: 118/62 (!) 136/99 (!) 128/57 (!) 136/51  Pulse: 73 80 76 78  Resp: (!) 21 17 15 13   Temp:    97.9 F (36.6 C)  TempSrc:    Oral  SpO2: 100% 100% 100% 100%  Weight:      Height:        General: Pt is alert, awake, not in acute distress Cardiovascular: RRR, S1/S2 +, no rubs, no gallops Respiratory: CTA bilaterally, no wheezing, no rhonchi Abdominal: Soft, NT, ND, bowel sounds + Extremities: no edema, no cyanosis, 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    The results of  significant diagnostics from this hospitalization (including imaging, microbiology, ancillary and laboratory) are listed below for reference.     Microbiology: Recent Results (from the past 240 hours)  Resp panel by RT-PCR (RSV, Flu A&B, Covid) Anterior Nasal Swab     Status: None   Collection Time: 05/18/24  8:47 PM   Specimen: Anterior Nasal Swab  Result Value Ref Range Status   SARS Coronavirus 2 by RT PCR NEGATIVE NEGATIVE Final   Influenza A by PCR NEGATIVE NEGATIVE Final   Influenza B by PCR NEGATIVE NEGATIVE Final    Comment: (NOTE) The Xpert Xpress SARS-CoV-2/FLU/RSV plus assay is intended as an aid in the diagnosis of influenza from Nasopharyngeal swab specimens and should not be used as a sole basis for treatment. Nasal washings and aspirates are unacceptable for Xpert Xpress  SARS-CoV-2/FLU/RSV testing.  Fact Sheet for Patients: BloggerCourse.com  Fact Sheet for Healthcare Providers: SeriousBroker.it  This test is not yet approved or cleared by the [place]  FDA and has been authorized for detection and/or diagnosis of SARS-CoV-2 by FDA under an Emergency Use Authorization (EUA). This EUA will remain in effect (meaning this test can be used) for the duration of the COVID-19 declaration under Section 564(b)(1) of the Act, 21 U.S.C. section 360bbb-3(b)(1), unless the authorization is terminated or revoked.     Resp Syncytial Virus by PCR NEGATIVE NEGATIVE Final    Comment: (NOTE) Fact Sheet for Patients: BloggerCourse.com  Fact Sheet for Healthcare Providers: SeriousBroker.it  This test is not yet approved or cleared by the [place]  FDA and has been authorized for detection and/or diagnosis of SARS-CoV-2 by FDA under an Emergency Use Authorization (EUA). This EUA will remain in effect (meaning this test can be used) for the duration of the COVID-19  declaration under Section 564(b)(1) of the Act, 21 U.S.C. section 360bbb-3(b)(1), unless the authorization is terminated or revoked.  Performed at [facility], [address] [place], [place] [address]      Labs: BNP (last 3 results) Recent Labs    05/18/24 2000  BNP 155.1*   Basic Metabolic Panel: Recent Labs  Lab 05/18/24 2000 05/19/24 0500  NA 140 143  K 4.0 3.8  CL 105 114*  CO2 21* 20*  GLUCOSE 114* 99  BUN 29* 24*  CREATININE 1.30* 1.09  CALCIUM  8.4* 8.0*  MG  --  1.6*  PHOS  --  3.9   Liver Function Tests: Recent Labs  Lab 05/18/24 2000  AST <10*  ALT 14  ALKPHOS 95  BILITOT 0.7  PROT 7.4  ALBUMIN 3.4*   No results for input(s): LIPASE, AMYLASE in the last 168 hours. No results for input(s): AMMONIA in the last 168 hours. CBC: Recent Labs  Lab 05/18/24 2000 05/19/24 0500  WBC 9.4 9.6  NEUTROABS 6.9  --   HGB 11.3* 10.6*  HCT 36.5* 33.3*  MCV 97.3 96.8  PLT 266 248   Cardiac Enzymes: No results for input(s): CKTOTAL, CKMB, CKMBINDEX, TROPONINI in the last 168 hours. BNP: Invalid input(s): POCBNP CBG: No results for input(s): GLUCAP in the last 168 hours. D-Dimer No results for input(s): DDIMER in the last 72 hours. Hgb A1c No results for input(s): HGBA1C in the last 72 hours. Lipid Profile No results for input(s): CHOL, HDL, LDLCALC, TRIG, CHOLHDL, LDLDIRECT in the last 72 hours. Thyroid  function studies No results for input(s): TSH, T4TOTAL, T3FREE, THYROIDAB in the last 72 hours.  Invalid input(s): FREET3 Anemia work up No results for input(s): VITAMINB12, FOLATE, FERRITIN, TIBC, IRON, RETICCTPCT in the last 72 hours. Urinalysis    Component Value Date/Time   COLORURINE YELLOW 05/18/2024 2323   APPEARANCEUR CLEAR 05/18/2024 2323   LABSPEC 1.014 05/18/2024 2323   PHURINE 5.0 05/18/2024 2323   GLUCOSEU NEGATIVE 05/18/2024 2323   HGBUR NEGATIVE 05/18/2024 2323    BILIRUBINUR NEGATIVE 05/18/2024 2323   BILIRUBINUR negative 03/11/2024 1627   KETONESUR NEGATIVE 05/18/2024 2323   PROTEINUR NEGATIVE 05/18/2024 2323   UROBILINOGEN 0.2 03/11/2024 1627   UROBILINOGEN 1.0 04/16/2012 1950   NITRITE NEGATIVE 05/18/2024 2323   LEUKOCYTESUR NEGATIVE 05/18/2024 2323   Sepsis Labs Recent Labs  Lab 05/18/24 2000 05/19/24 0500  WBC 9.4 9.6   Microbiology Recent Results (from the past 240 hours)  Resp panel by RT-PCR (RSV, Flu A&B, Covid) Anterior Nasal Swab  Status: None   Collection Time: 05/18/24  8:47 PM   Specimen: Anterior Nasal Swab  Result Value Ref Range Status   SARS Coronavirus 2 by RT PCR NEGATIVE NEGATIVE Final   Influenza A by PCR NEGATIVE NEGATIVE Final   Influenza B by PCR NEGATIVE NEGATIVE Final    Comment: (NOTE) The Xpert Xpress SARS-CoV-2/FLU/RSV plus assay is intended as an aid in the diagnosis of influenza from Nasopharyngeal swab specimens and should not be used as a sole basis for treatment. Nasal washings and aspirates are unacceptable for Xpert Xpress SARS-CoV-2/FLU/RSV testing.  Fact Sheet for Patients: BloggerCourse.com  Fact Sheet for Healthcare Providers: SeriousBroker.it  This test is not yet approved or cleared by the [place]  FDA and has been authorized for detection and/or diagnosis of SARS-CoV-2 by FDA under an Emergency Use Authorization (EUA). This EUA will remain in effect (meaning this test can be used) for the duration of the COVID-19 declaration under Section 564(b)(1) of the Act, 21 U.S.C. section 360bbb-3(b)(1), unless the authorization is terminated or revoked.     Resp Syncytial Virus by PCR NEGATIVE NEGATIVE Final    Comment: (NOTE) Fact Sheet for Patients: BloggerCourse.com  Fact Sheet for Healthcare Providers: SeriousBroker.it  This test is not yet approved or cleared by the [place] FDA and has been authorized for detection and/or diagnosis of SARS-CoV-2 by FDA under an Emergency Use Authorization (EUA). This EUA will remain in effect (meaning this test can be used) for the duration of the COVID-19 declaration under Section 564(b)(1) of the Act, 21 U.S.C. section 360bbb-3(b)(1), unless the authorization is terminated or revoked.  Performed at [facility], [address], [place], [place] [address]     FURTHER DISCHARGE INSTRUCTIONS:   Get Medicines reviewed and adjusted: Please take all your medications with you for your next visit with your Primary MD   Laboratory/radiological data: Please request your Primary MD to go over all hospital tests and procedure/radiological results at the follow up, please ask your Primary MD to get all Hospital records sent to his/her office.   In some cases, they will be blood work, cultures and biopsy results pending at the time of your discharge. Please request that your primary care M.D. goes through all the records of your hospital data and follows up on these results.   Also Note the following: If you experience worsening of your admission symptoms, develop shortness of breath, life threatening emergency, suicidal or homicidal thoughts you must seek medical attention immediately by calling 911 or calling your MD immediately  if symptoms less severe.   You must read complete instructions/literature along with all the possible adverse reactions/side effects for all the Medicines you take and that have been prescribed to you. Take any new Medicines after you have completely understood and accpet all the possible adverse reactions/side effects.    patient was instructed, not to drive, operate heavy machinery, perform activities at heights, swimming or participation in water  activities or provide baby-sitting services while on Pain, Sleep and Anxiety Medications; until their outpatient Physician has advised to do so  again. Also recommended to not to take more than prescribed Pain, Sleep and Anxiety Medications.  It is not advisable to combine anxiety, sleep and pain medications without talking with your primary care provider.     Wear Seat belts while driving.   Please note: You were cared for by a hospitalist during your hospital stay. Once you are discharged, your primary care physician will handle any  further medical issues. Please note that NO REFILLS for any discharge medications will be authorized once you are discharged, as it is imperative that you return to your primary care physician (or establish a relationship with a primary care physician if you do not have one) for your post hospital discharge needs so that they can reassess your need for medications and monitor your lab values  Time coordinating discharge: Over 30 minutes  SIGNED:   Fredia Skeeter, MD  [facility] 05/19/2024, 12:49 PM *Please note that this is a verbal dictation therefore any spelling or grammatical errors are due to the Dragon Medical One system interpretation. If 7PM-7AM, please contact night-coverage [email or web address]

## 2024-05-19 NOTE — Progress Notes (Signed)
 EEG complete - results pending

## 2024-05-19 NOTE — ED Notes (Signed)
 Report given to receiving RN.

## 2024-06-01 ENCOUNTER — Other Ambulatory Visit: Payer: Self-pay | Admitting: Family Medicine

## 2024-06-01 DIAGNOSIS — M109 Gout, unspecified: Secondary | ICD-10-CM

## 2024-06-06 ENCOUNTER — Telehealth: Payer: Self-pay

## 2024-06-06 NOTE — Transitions of Care (Post Inpatient/ED Visit) (Signed)
   06/06/2024  Name: Eduardo Moon MRN: [id number] DOB: May 10, 1946  Today's TOC FU Call Status: Today's TOC FU Call Status:: Unsuccessful Call (1st Attempt) Unsuccessful Call (1st Attempt) Date: 06/06/24  Attempted to reach the patient regarding the most recent Inpatient/ED visit.  Follow Up Plan: Additional outreach attempts will be made to reach the patient to complete the Transitions of Care (Post Inpatient/ED visit) call.   Signature  Charmaine Bloodgood, LPN [facility] Health Advisor [facility] You Are. We Are. [facility] Direct Dial [phone]

## 2024-06-14 ENCOUNTER — Encounter: Payer: Self-pay | Admitting: Family Medicine

## 2024-06-14 NOTE — Progress Notes (Signed)
 Pharmacy Quality Measure Review  This patient is appearing on a report for being at risk of failing the adherence measure for hypertension (ACEi/ARB) medications this calendar year.   Medication: Lisinopril  30mg  Last fill date: 11/8 for 90 day supply  Insurance report was not up to date. No action needed at this time.   Angela Baalmann, PharmD [facility] Pharmacist

## 2024-06-16 ENCOUNTER — Other Ambulatory Visit: Payer: Self-pay | Admitting: Family Medicine

## 2024-06-16 DIAGNOSIS — E785 Hyperlipidemia, unspecified: Secondary | ICD-10-CM

## 2024-06-20 ENCOUNTER — Encounter: Payer: Self-pay | Admitting: *Deleted

## 2024-06-20 ENCOUNTER — Other Ambulatory Visit: Payer: Self-pay | Admitting: Family Medicine

## 2024-06-20 DIAGNOSIS — I493 Ventricular premature depolarization: Secondary | ICD-10-CM

## 2024-06-20 DIAGNOSIS — F419 Anxiety disorder, unspecified: Secondary | ICD-10-CM

## 2024-06-20 DIAGNOSIS — I10 Essential (primary) hypertension: Secondary | ICD-10-CM

## 2024-06-20 NOTE — Progress Notes (Signed)
 Eduardo Moon                                          MRN: [id number]   06/20/2024   The [facility] Quality Team Specialist reviewed this patient medical record for the purposes of chart review for care gap closure. The following were reviewed: chart review for care gap closure-kidney health evaluation for diabetes:eGFR  and uACR.    [facility] Quality Team

## 2024-07-08 ENCOUNTER — Encounter: Payer: Self-pay | Admitting: Family Medicine

## 2024-07-08 MED ORDER — LEVETIRACETAM ER 500 MG PO TB24: 500.0000 mg | ORAL_TABLET | Freq: Two times a day (BID) | ORAL | 0 refills | Status: AC

## 2024-07-08 MED ORDER — METOPROLOL SUCCINATE ER 50 MG PO TB24: 50.0000 mg | ORAL_TABLET | Freq: Every day | ORAL | 3 refills | Status: AC

## 2024-07-08 NOTE — Addendum Note (Signed)
 Addended by: Schelly Chuba E on: 07/08/2024 03:10 PM   Modules accepted: Orders

## 2024-07-15 ENCOUNTER — Ambulatory Visit: Admitting: Family Medicine

## 2024-08-12 ENCOUNTER — Ambulatory Visit: Admitting: Family Medicine

## 2024-08-30 ENCOUNTER — Other Ambulatory Visit: Payer: Self-pay | Admitting: Family Medicine

## 2024-08-30 DIAGNOSIS — R197 Diarrhea, unspecified: Secondary | ICD-10-CM

## 2024-09-01 ENCOUNTER — Ambulatory Visit: Admitting: Family Medicine

## 2024-09-20 ENCOUNTER — Ambulatory Visit: Admitting: Family Medicine

## 2025-02-27 ENCOUNTER — Ambulatory Visit
